# Patient Record
Sex: Male | Born: 1970
Health system: Southern US, Community
[De-identification: ages and names within clinical notes are randomized; demographics above are authoritative.]

## PROBLEM LIST (undated history)

## (undated) DIAGNOSIS — F32A Depression, unspecified: Secondary | ICD-10-CM

## (undated) DIAGNOSIS — F329 Major depressive disorder, single episode, unspecified: Secondary | ICD-10-CM

## (undated) DIAGNOSIS — M5020 Other cervical disc displacement, unspecified cervical region: Secondary | ICD-10-CM

## (undated) HISTORY — DX: Depression, unspecified: F32.A

## (undated) HISTORY — DX: Major depressive disorder, single episode, unspecified: F32.9

---

## 2000-07-28 ENCOUNTER — Emergency Department (HOSPITAL_COMMUNITY): Admission: EM | Admit: 2000-07-28 | Discharge: 2000-07-28 | Payer: Self-pay | Admitting: Emergency Medicine

## 2000-07-28 ENCOUNTER — Encounter: Payer: Self-pay | Admitting: Emergency Medicine

## 2005-01-10 ENCOUNTER — Emergency Department (HOSPITAL_COMMUNITY): Admission: EM | Admit: 2005-01-10 | Discharge: 2005-01-10 | Payer: Self-pay | Admitting: Emergency Medicine

## 2005-03-07 ENCOUNTER — Ambulatory Visit (HOSPITAL_COMMUNITY): Admission: RE | Admit: 2005-03-07 | Discharge: 2005-03-07 | Payer: Self-pay | Admitting: Family Medicine

## 2016-01-11 DIAGNOSIS — R109 Unspecified abdominal pain: Secondary | ICD-10-CM | POA: Diagnosis not present

## 2016-01-11 DIAGNOSIS — M542 Cervicalgia: Secondary | ICD-10-CM | POA: Diagnosis not present

## 2016-01-24 DIAGNOSIS — Z0000 Encounter for general adult medical examination without abnormal findings: Secondary | ICD-10-CM | POA: Diagnosis not present

## 2016-01-24 DIAGNOSIS — R350 Frequency of micturition: Secondary | ICD-10-CM | POA: Diagnosis not present

## 2016-01-24 DIAGNOSIS — Z13220 Encounter for screening for lipoid disorders: Secondary | ICD-10-CM | POA: Diagnosis not present

## 2016-01-24 DIAGNOSIS — Z131 Encounter for screening for diabetes mellitus: Secondary | ICD-10-CM | POA: Diagnosis not present

## 2016-02-08 DIAGNOSIS — M5020 Other cervical disc displacement, unspecified cervical region: Secondary | ICD-10-CM | POA: Diagnosis not present

## 2016-02-08 DIAGNOSIS — M546 Pain in thoracic spine: Secondary | ICD-10-CM | POA: Diagnosis not present

## 2016-02-08 DIAGNOSIS — M542 Cervicalgia: Secondary | ICD-10-CM | POA: Diagnosis not present

## 2016-02-08 DIAGNOSIS — M545 Low back pain: Secondary | ICD-10-CM | POA: Diagnosis not present

## 2016-02-08 DIAGNOSIS — M4722 Other spondylosis with radiculopathy, cervical region: Secondary | ICD-10-CM | POA: Diagnosis not present

## 2016-03-14 DIAGNOSIS — M546 Pain in thoracic spine: Secondary | ICD-10-CM | POA: Diagnosis not present

## 2016-03-14 DIAGNOSIS — M542 Cervicalgia: Secondary | ICD-10-CM | POA: Diagnosis not present

## 2016-03-14 DIAGNOSIS — M5030 Other cervical disc degeneration, unspecified cervical region: Secondary | ICD-10-CM | POA: Diagnosis not present

## 2016-03-14 DIAGNOSIS — M5020 Other cervical disc displacement, unspecified cervical region: Secondary | ICD-10-CM | POA: Diagnosis not present

## 2016-03-14 DIAGNOSIS — M4722 Other spondylosis with radiculopathy, cervical region: Secondary | ICD-10-CM | POA: Diagnosis not present

## 2016-05-03 DIAGNOSIS — R350 Frequency of micturition: Secondary | ICD-10-CM | POA: Diagnosis not present

## 2016-05-03 DIAGNOSIS — F411 Generalized anxiety disorder: Secondary | ICD-10-CM | POA: Diagnosis not present

## 2016-05-15 DIAGNOSIS — M75101 Unspecified rotator cuff tear or rupture of right shoulder, not specified as traumatic: Secondary | ICD-10-CM | POA: Diagnosis not present

## 2016-05-18 DIAGNOSIS — M5030 Other cervical disc degeneration, unspecified cervical region: Secondary | ICD-10-CM | POA: Diagnosis not present

## 2016-05-18 DIAGNOSIS — M4722 Other spondylosis with radiculopathy, cervical region: Secondary | ICD-10-CM | POA: Diagnosis not present

## 2016-05-18 DIAGNOSIS — M5020 Other cervical disc displacement, unspecified cervical region: Secondary | ICD-10-CM | POA: Diagnosis not present

## 2016-05-18 DIAGNOSIS — M75101 Unspecified rotator cuff tear or rupture of right shoulder, not specified as traumatic: Secondary | ICD-10-CM | POA: Diagnosis not present

## 2016-05-27 DIAGNOSIS — L237 Allergic contact dermatitis due to plants, except food: Secondary | ICD-10-CM | POA: Diagnosis not present

## 2016-07-18 DIAGNOSIS — M5030 Other cervical disc degeneration, unspecified cervical region: Secondary | ICD-10-CM | POA: Diagnosis not present

## 2016-07-18 DIAGNOSIS — M4722 Other spondylosis with radiculopathy, cervical region: Secondary | ICD-10-CM | POA: Diagnosis not present

## 2016-07-18 DIAGNOSIS — M5412 Radiculopathy, cervical region: Secondary | ICD-10-CM | POA: Diagnosis not present

## 2016-07-18 DIAGNOSIS — M5020 Other cervical disc displacement, unspecified cervical region: Secondary | ICD-10-CM | POA: Diagnosis not present

## 2016-07-28 DIAGNOSIS — Z840 Family history of diseases of the skin and subcutaneous tissue: Secondary | ICD-10-CM | POA: Diagnosis not present

## 2016-07-28 DIAGNOSIS — F411 Generalized anxiety disorder: Secondary | ICD-10-CM | POA: Diagnosis not present

## 2016-07-28 DIAGNOSIS — R5382 Chronic fatigue, unspecified: Secondary | ICD-10-CM | POA: Diagnosis not present

## 2016-08-01 DIAGNOSIS — M75101 Unspecified rotator cuff tear or rupture of right shoulder, not specified as traumatic: Secondary | ICD-10-CM | POA: Diagnosis not present

## 2016-08-01 DIAGNOSIS — M4722 Other spondylosis with radiculopathy, cervical region: Secondary | ICD-10-CM | POA: Diagnosis not present

## 2016-08-14 DIAGNOSIS — M5020 Other cervical disc displacement, unspecified cervical region: Secondary | ICD-10-CM | POA: Diagnosis not present

## 2016-08-14 DIAGNOSIS — M4722 Other spondylosis with radiculopathy, cervical region: Secondary | ICD-10-CM | POA: Diagnosis not present

## 2016-08-14 DIAGNOSIS — M5412 Radiculopathy, cervical region: Secondary | ICD-10-CM | POA: Diagnosis not present

## 2016-08-14 DIAGNOSIS — M5030 Other cervical disc degeneration, unspecified cervical region: Secondary | ICD-10-CM | POA: Diagnosis not present

## 2016-11-21 DIAGNOSIS — G4709 Other insomnia: Secondary | ICD-10-CM | POA: Diagnosis not present

## 2016-11-21 DIAGNOSIS — R5383 Other fatigue: Secondary | ICD-10-CM | POA: Diagnosis not present

## 2016-11-21 DIAGNOSIS — R1012 Left upper quadrant pain: Secondary | ICD-10-CM | POA: Diagnosis not present

## 2016-11-21 DIAGNOSIS — F411 Generalized anxiety disorder: Secondary | ICD-10-CM | POA: Diagnosis not present

## 2016-11-27 DIAGNOSIS — N200 Calculus of kidney: Secondary | ICD-10-CM | POA: Diagnosis not present

## 2016-11-27 DIAGNOSIS — N289 Disorder of kidney and ureter, unspecified: Secondary | ICD-10-CM | POA: Diagnosis not present

## 2016-12-07 DIAGNOSIS — F411 Generalized anxiety disorder: Secondary | ICD-10-CM | POA: Diagnosis not present

## 2016-12-07 DIAGNOSIS — G4700 Insomnia, unspecified: Secondary | ICD-10-CM | POA: Diagnosis not present

## 2016-12-07 DIAGNOSIS — R5383 Other fatigue: Secondary | ICD-10-CM | POA: Diagnosis not present

## 2016-12-13 DIAGNOSIS — N281 Cyst of kidney, acquired: Secondary | ICD-10-CM | POA: Diagnosis not present

## 2016-12-20 ENCOUNTER — Ambulatory Visit: Payer: BLUE CROSS/BLUE SHIELD | Attending: Neurosurgery

## 2016-12-20 DIAGNOSIS — M6281 Muscle weakness (generalized): Secondary | ICD-10-CM | POA: Insufficient documentation

## 2016-12-20 DIAGNOSIS — M62838 Other muscle spasm: Secondary | ICD-10-CM | POA: Diagnosis not present

## 2016-12-20 DIAGNOSIS — M542 Cervicalgia: Secondary | ICD-10-CM | POA: Diagnosis not present

## 2016-12-20 DIAGNOSIS — M79601 Pain in right arm: Secondary | ICD-10-CM | POA: Insufficient documentation

## 2016-12-20 NOTE — Therapy (Signed)
[facility] [address], [address] [place], [place], [address] Phone: [phone]   Fax:  [phone]  Physical Therapy Evaluation  Patient Details  Name: Henry Jennings MRN: [id number] Date of Birth: [birth year] Referring Provider: Shirlean Kelly, MD   Encounter Date: 12/20/2016  PT End of Session - 12/20/16 1615    Visit Number  1    Date for PT Re-Evaluation  02/14/17    PT Start Time  1533    PT Stop Time  1615    PT Time Calculation (min)  42 min    Activity Tolerance  Patient tolerated treatment well    Behavior During Therapy  [facility] for tasks assessed/performed       Past Medical History:  Diagnosis Date  . Depression     History reviewed. No pertinent surgical history.  There were no vitals filed for this visit.   Subjective Assessment - 12/20/16 1536    Subjective  Pt presents to PT with complaints of neck pain and Rt UE radiculopathy/weakness of a chronic nature.  Pt describes symptoms that began many years ago with incresaed symptoms over the past year. Pt had MRI over the Summer and reports that he has HNP C2-5 and MD wants to fuse if PT doesn't help.      Diagnostic tests  MRI: HNP C2-5    Patient Stated Goals  reduce Rt UE radiculopathy, improve Rt UE strength, avoid surgery    Currently in Pain?  Yes    Pain Score  3     Pain Location  Neck    Pain Orientation  Right;Left    Pain Descriptors / Indicators  Aching;Burning;Stabbing;Constant    Pain Radiating Towards  Rt UE radiculopathy    Pain Onset  More than a month ago    Pain Frequency  Constant    Aggravating Factors   stress, typing/computer, writing    Pain Relieving Factors  massager, rest, supplements for inflammation         [facility] PT Assessment - 12/20/16 0001      Assessment   Medical Diagnosis  herniated nucleus pulposus, cervical, Rt    Referring Provider  Shirlean Kelly, MD    Onset Date/Surgical Date  12/21/15    Next MD Visit   01/2017      Precautions   Precautions  None      Restrictions   Weight Bearing Restrictions  No      Balance Screen   Has the patient fallen in the past 6 months  No    Has the patient had a decrease in activity level because of a fear of falling?   No    Is the patient reluctant to leave their home because of a fear of falling?   No      Home Public house manager residence    Living Arrangements  Spouse/significant other      Prior Function   Level of Independence  Independent    Vocation  Unemployed    Leisure  hiking, writing, art, music      Cognition   Overall Cognitive Status  Within Functional Limits for tasks assessed      Observation/Other Assessments   Focus on Therapeutic Outcomes (FOTO)   49% limitaiton      Posture/Postural Control   Posture/Postural Control  Postural limitations    Postural Limitations  Decreased thoracic kyphosis;Forward head      ROM / Strength  AROM / PROM / Strength  AROM;Strength      AROM   Overall AROM   Deficits    Overall AROM Comments  cervical A/ROM is full except limited by 25% with Lt rotation and bil sidebending with muscle stiffness at end range.  No change in Rt UE with cervical A/ROM.  Rt shoulder is painful at end range.      Strength   Overall Strength  Deficits    Overall Strength Comments  Lt shoulder strength is full    Strength Assessment Site  Shoulder;Hand    Right/Left Shoulder  Right    Right Shoulder Flexion  4-/5    Right Shoulder ABduction  4-/5    Right Shoulder Internal Rotation  3+/5    Right Shoulder External Rotation  4-/5    Right/Left hand  Right;Left    Right Hand Grip (lbs)  50    Left Hand Grip (lbs)  71      Palpation   Spinal mobility  reduced mobility in the cervical vertebral segments with pain    Palpation comment  tension and trigger points over bil upper traps and cervical paraspinals .      Ambulation/Gait   Gait Pattern  Within Functional Limits              Objective measurements completed on examination: See above findings.              PT Education - 12/20/16 1612    Education provided  Yes    Education Details  posture, scap squeezes, chin tucks    Person(s) Educated  Patient    Methods  Explanation;Demonstration;Handout    Comprehension  Verbalized understanding;Returned demonstration       PT Short Term Goals - 12/20/16 1547      PT SHORT TERM GOAL #1   Title  be independent in initial HEP    Time  4    Period  Weeks    Status  New    Target Date  01/17/17      PT SHORT TERM GOAL #2   Title  report a 25% reduction in Rt UE radiculopathy    Time  4    Period  Weeks    Status  New    Target Date  01/17/17      PT SHORT TERM GOAL #3   Title  demonstrate 4/5 Rt shoulder strength to improve use with ADLs and self-care    Time  4    Period  Weeks    Status  New        PT Long Term Goals - 12/20/16 1703      PT LONG TERM GOAL #1   Title  be independent in advanced HEP    Time  8    Period  Weeks    Status  New    Target Date  02/14/17      PT LONG TERM GOAL #2   Title  report a 50% reduction in Rt UE radiculopathy    Time  8    Period  Weeks    Status  New    Target Date  02/14/17      PT LONG TERM GOAL #3   Title  demonstrate > or = to 65# Rt grip strength to improve lifting and holding heavy items.      Time  8    Period  Weeks    Status  New    Target Date  02/14/17      PT LONG TERM GOAL #4   Title  report < or = to 2/10 cervical pain with turning head or sitting at the computer    Time  4    Period  Weeks    Status  New    Target Date  02/14/17             Plan - 12/20/16 1640    Clinical Impression Statement  Pt presents to PT with complaints of neck pain and Rt UE radiculopathy and weakness that is chronic and worsened over the past year.  Pt had MRI and this showed C3-5 HNP per pt report.  Pt demonstrates reduced thoracic kyphosis, forward head posture, Rt UE  and hand weakness and limited/painful Rt UE and cervical A/ROM.  Pt reports 3-5/10 Rt UE pain that increases with computer use, writing and when flexing the head.  Pt will benefit from skilled PT for Rt UE strength, flexibility, cervical stabilization, postural stabilization, manual, modalities and traction to address pain and radiculopathy.    History and Personal Factors relevant to plan of care:  none    Clinical Presentation  Evolving    Clinical Presentation due to:  worsening over the past year and losing Rt UE strength    Clinical Decision Making  Low    Rehab Potential  Good    PT Frequency  2x / week    PT Duration  8 weeks    PT Treatment/Interventions  ADLs/Self Care Home Management;Cryotherapy;Geologist, engineering;Therapeutic activities;Therapeutic exercise;Neuromuscular re-education;Patient/family education;Manual techniques;Taping    PT Next Visit Plan  traction, cervical retraction/extension, Rt UE strength, grip strength, manual to cervical musculature    Consulted and Agree with Plan of Care  Patient       Patient will benefit from skilled therapeutic intervention in order to improve the following deficits and impairments:  Pain, Impaired flexibility, Decreased activity tolerance, Decreased range of motion, Decreased strength, Postural dysfunction, Increased muscle spasms  Visit Diagnosis: Cervicalgia - Plan: PT plan of care cert/re-cert  Pain in right arm - Plan: PT plan of care cert/re-cert  Muscle weakness (generalized) - Plan: PT plan of care cert/re-cert  Other muscle spasm - Plan: PT plan of care cert/re-cert     Problem List There are no active problems to display for this patient.   [name], PT 12/20/16 5:06 PM  [facility] [address] [place], [place], [address] Phone: [phone]   Fax:  [phone]  Name: Henry Jennings MRN:  [id number] Date of Birth: Nov 03, 1970

## 2016-12-20 NOTE — Patient Instructions (Addendum)
  Scapular Retraction: Elbow Flexion (Standing)   With elbows bent to 90, pinch shoulder blades together and rotate arms out, keeping elbows bent. Repeat _10___ times per set. Do _1___ sets per session. Do many____ sessions per day.   Nod: Deep Cervical Flexor Retrain - Supine    Nod head, tipping chin down. Tighten muscles in back of throat. Hold _5__ seconds. Do __10_ times, __3_ times per day.  http://ss.exer.us/192   Copyright  VHI. All rights reserved.  Axial Extension (Chin Tuck)    Pull chin in and lengthen back of neck. Hold _5___ seconds while counting out loud. Repeat _10___ times. Do _3___ sessions per day.  http://gt2.exer.us/449   Copyright  VHI. All rights reserved.     Posture - Standing   Good posture is important. Avoid slouching and forward head thrust. Maintain curve in low back and align ears over shoulders, hips over ankles.  Pull your belly button in toward your back bone. Posture Tips DO: - stand tall and erect - keep chin tucked in - keep head and shoulders in alignment - check posture regularly in mirror or large window - pull head back against headrest in car seat;  Change your position often.  Sit with lumbar support. DON'T: - slouch or slump while watching TV or reading - sit, stand or lie in one position  for too long;  Sitting is especially hard on the spine so if you sit at a desk/use the computer, then stand up often! Copyright  VHI. All rights reserved.  Posture - Sitting  Sit upright, head facing forward. Try using a roll to support lower back. Keep shoulders relaxed, and avoid rounded back. Keep hips level with knees. Avoid crossing legs for long periods. Copyright  VHI. All rights reserved.  Chronic neck strain can develop because of poor posture and faulty work habits  Postural strain related to slumped sitting and forward head posture is a leading cause of headaches, neck and upper back pain  General strengthening and  flexibility exercises are helpful in the treatment of neck pain.  Most importantly, you should learn to correct the posture that may be contributing to chronic pain.   Change positions frequently  Change your work or home environment to improve posture and mechanics.   [facility] [address], [address] [place], [place] [address] Phone # [phone] Fax [phone]

## 2017-01-04 DIAGNOSIS — F411 Generalized anxiety disorder: Secondary | ICD-10-CM | POA: Diagnosis not present

## 2017-01-04 DIAGNOSIS — G4700 Insomnia, unspecified: Secondary | ICD-10-CM | POA: Diagnosis not present

## 2017-01-10 ENCOUNTER — Ambulatory Visit: Payer: BLUE CROSS/BLUE SHIELD | Attending: Neurosurgery

## 2017-01-10 DIAGNOSIS — M6281 Muscle weakness (generalized): Secondary | ICD-10-CM | POA: Diagnosis not present

## 2017-01-10 DIAGNOSIS — M62838 Other muscle spasm: Secondary | ICD-10-CM | POA: Diagnosis not present

## 2017-01-10 DIAGNOSIS — M542 Cervicalgia: Secondary | ICD-10-CM

## 2017-01-10 DIAGNOSIS — M79601 Pain in right arm: Secondary | ICD-10-CM | POA: Diagnosis not present

## 2017-01-10 NOTE — Patient Instructions (Addendum)
KEEP HEAD IN NEUTRAL AND SHOULDERS DOWN AND RELAXED   Both arms at the same time.  Hold tubing in right hand, arm forward. Pull arm back, elbow straight. Repeat __10__ times per set. Do __2__ sets per session. Do _1-2___ sessions per day.  Copyright  VHI. All rights reserved.     With resistive band anchored in door, grasp both ends. Keeping elbows bent, pull back, squeezing shoulder blades together. Hold _3__ seconds. Repeat _2x10___ times. Do _1-2___ sessions per day.    Side Pull: Double Arm   On back, knees bent, feet flat. Arms perpendicular to body, shoulder level, elbows straight but relaxed. Pull arms out to sides, elbows straight. Resistance band comes across collarbones, hands toward floor. Hold momentarily. Slowly return to starting position. Repeat _10__ times. Band color _yellow____     Shoulder Rotation: Double Arm   On back, knees bent, feet flat, elbows tucked at sides, bent 90, hands palms up. Pull hands apart and down toward floor, keeping elbows near sides. Hold momentarily. Slowly return to starting position. Repeat _10__ times. Band color __yellow    Angry Cat Stretch  Tuck chin and tighten stomach, arching back. Repeat __5-10__ times per set.  Do _1-2___ sessions per day. Child Pose   Sitting on knees, fold body over legs and relax head and arms on floor. Hold for _20___ breaths.  Do 3 reps.  1-2 times a day.       [facility] [address], [address] [place], [place] [address] Phone # [phone] Fax [phone]____

## 2017-01-10 NOTE — Therapy (Signed)
[facility] [address], [address] [place], [place], [address] Phone: [phone]   Fax:  [phone]  Physical Therapy Treatment  Patient Details  Name: Henry Jennings MRN: [id number] Date of Birth: 06/28/1970 Referring Provider: Shirlean KellyNudelman, Robert, MD   Encounter Date: 01/10/2017  PT End of Session - 01/10/17 1435    Visit Number  2    Date for PT Re-Evaluation  02/14/17    PT Start Time  1401    PT Stop Time  1449    PT Time Calculation (min)  48 min    Activity Tolerance  Patient tolerated treatment well    Behavior During Therapy  [facility] for tasks assessed/performed       Past Medical History:  Diagnosis Date  . Depression     History reviewed. No pertinent surgical history.  There were no vitals filed for this visit.  Subjective Assessment - 01/10/17 1402    Subjective  I started taking a new medication, Trazodone.  I feel a little better but not sure if it was the medication or the exercises.      Patient Stated Goals  reduce Rt UE radiculopathy, improve Rt UE strength, avoid surgery    Currently in Pain?  Yes    Pain Score  5     Pain Location  Neck    Pain Orientation  Right    Pain Descriptors / Indicators  Aching;Burning;Stabbing;Constant    Pain Type  Chronic pain    Pain Onset  More than a month ago    Pain Frequency  Constant    Aggravating Factors   typing/computer, lifting, stress, writing, turning head with driving    Pain Relieving Factors  massager, rest, supplements for inflammation                      [facility] Adult PT Treatment/Exercise - 01/10/17 0001      Exercises   Exercises  Shoulder;Neck      Neck Exercises: Supine   Neck Retraction  20 reps;5 secs      Shoulder Exercises: Supine   External Rotation  Strengthening;Both;20 reps;Theraband    Theraband Level (Shoulder External Rotation)  Level 2 (Red)      Shoulder Exercises: Standing   Extension  Strengthening;Both;20  reps;Theraband    Theraband Level (Shoulder Extension)  Level 2 (Red)    Row  Strengthening;Both;20 reps;Theraband    Theraband Level (Shoulder Row)  Level 2 (Red)      Shoulder Exercises: ROM/Strengthening   UBE (Upper Arm Bike)  Level 1x 6 minutes (3/3) PT present to discuss progress      Shoulder Exercises: Stretch   Other Shoulder Stretches  quadruped: prayer stretch 3x20 seconds    Other Shoulder Stretches  mad cow: thoracic stretch x10      Modalities   Modalities  Traction      Traction   Type of Traction  Cervical    Min (lbs)  5    Max (lbs)  15    Hold Time  60    Rest Time  10    Time  15             PT Education - 01/10/17 1434    Education provided  Yes    Education Details  quadruped flexibility, supine theraband, standing scapular strength    Person(s) Educated  Patient    Methods  Explanation;Demonstration;Handout    Comprehension  Verbalized understanding;Returned demonstration  PT Short Term Goals - 01/10/17 1405      PT SHORT TERM GOAL #1   Title  be independent in initial HEP    Baseline  still learning    Time  4    Period  Weeks    Status  On-going      PT SHORT TERM GOAL #2   Title  report a 25% reduction in Rt UE radiculopathy    Time  4    Period  Weeks    Status  On-going        PT Long Term Goals - 12/20/16 1703      PT LONG TERM GOAL #1   Title  be independent in advanced HEP    Time  8    Period  Weeks    Status  New    Target Date  02/14/17      PT LONG TERM GOAL #2   Title  report a 50% reduction in Rt UE radiculopathy    Time  8    Period  Weeks    Status  New    Target Date  02/14/17      PT LONG TERM GOAL #3   Title  demonstrate > or = to 65# Rt grip strength to improve lifting and holding heavy items.      Time  8    Period  Weeks    Status  New    Target Date  02/14/17      PT LONG TERM GOAL #4   Title  report < or = to 2/10 cervical pain with turning head or sitting at the computer    Time  4     Period  Weeks    Status  New    Target Date  02/14/17            Plan - 01/10/17 1406    Clinical Impression Statement  Pt with lapse in treatment after evaluation.  Pt has been doing his exercises for retraction and has been working on posture.  PT advanced HEP for posutral strength in supine and standing and empasized scapular position and neck retraction with this.  Pt responded well to traction today.  Pt will continue to benefit from skilled PT for traction to address radiculopathy and posutral strength and thoracic/cervical flexibility.      Rehab Potential  Good    PT Frequency  2x / week    PT Duration  8 weeks    PT Treatment/Interventions  ADLs/Self Care Home Management;Cryotherapy;Geologist, engineering;Therapeutic activities;Therapeutic exercise;Neuromuscular re-education;Patient/family education;Manual techniques;Taping    PT Next Visit Plan  assess response to traction, cervical retraction/extension, Rt UE strength, grip strength, manual to cervical musculature    Consulted and Agree with Plan of Care  Patient       Patient will benefit from skilled therapeutic intervention in order to improve the following deficits and impairments:  Pain, Impaired flexibility, Decreased activity tolerance, Decreased range of motion, Decreased strength, Postural dysfunction, Increased muscle spasms  Visit Diagnosis: Cervicalgia  Pain in right arm  Muscle weakness (generalized)  Other muscle spasm     Problem List There are no active problems to display for this patient.    [name], PT 01/10/17 2:37 PM  [facility] [address], [address] [place], [place], [address] Phone: [phone]   Fax:  [phone]  Name: Henry Jennings MRN: [id number] Date of Birth: 20-Apr-1970

## 2017-01-17 ENCOUNTER — Ambulatory Visit: Payer: BLUE CROSS/BLUE SHIELD

## 2017-01-17 DIAGNOSIS — M6281 Muscle weakness (generalized): Secondary | ICD-10-CM | POA: Diagnosis not present

## 2017-01-17 DIAGNOSIS — M79601 Pain in right arm: Secondary | ICD-10-CM

## 2017-01-17 DIAGNOSIS — M542 Cervicalgia: Secondary | ICD-10-CM | POA: Diagnosis not present

## 2017-01-17 DIAGNOSIS — M62838 Other muscle spasm: Secondary | ICD-10-CM | POA: Diagnosis not present

## 2017-01-17 NOTE — Therapy (Signed)
[facility] [address] [place], [place], [address] Phone: [phone]   Fax:  [phone]  Physical Therapy Treatment  Patient Details  Name: Henry Jennings MRN: [id number] Date of Birth: [birth year] Referring Provider: Shirlean Kelly, MD   Encounter Date: 01/17/2017  PT End of Session - 01/17/17 1426    Visit Number  3    Date for PT Re-Evaluation  02/14/17    PT Start Time  1400    PT Stop Time  1450    PT Time Calculation (min)  50 min    Activity Tolerance  Patient tolerated treatment well    Behavior During Therapy  [facility] for tasks assessed/performed       Past Medical History:  Diagnosis Date  . Depression     History reviewed. No pertinent surgical history.  There were no vitals filed for this visit.  Subjective Assessment - 01/17/17 1400    Subjective  I was sore for a day after my last treatment and then had a couple of good days.  I haven't had to correct my posture as much this week.      Currently in Pain?  Yes    Pain Score  5     Pain Location  Neck    Pain Orientation  Right    Pain Descriptors / Indicators  Aching;Burning;Constant;Stabbing    Pain Type  Chronic pain    Pain Onset  More than a month ago    Pain Frequency  Constant    Aggravating Factors   typing/computer, lifting, stress    Pain Relieving Factors  medication, rest, massager         [facility] PT Assessment - 01/17/17 0001      Strength   Right Hand Grip (lbs)  51                  [facility] Adult PT Treatment/Exercise - 01/17/17 0001      Shoulder Exercises: Supine   External Rotation  Strengthening;Both;20 reps;Theraband    Theraband Level (Shoulder External Rotation)  Level 2 (Red)      Shoulder Exercises: Standing   Extension  Strengthening;Both;20 reps;Theraband    Theraband Level (Shoulder Extension)  Level 2 (Red)    Row  Strengthening;Both;20 reps;Theraband    Theraband Level (Shoulder Row)  Level 2  (Red)      Shoulder Exercises: ROM/Strengthening   UBE (Upper Arm Bike)  Level 1x 6 minutes (3/3) PT present to discuss progress    Other ROM/Strengthening Exercises  D2  2x10 red      Hand Exercises for Cervical Radiculopathy   Gross Grasp  red digiflex x 3 minutes      Modalities   Modalities  Traction      Traction   Type of Traction  Cervical    Min (lbs)  5    Max (lbs)  15    Hold Time  60    Rest Time  10    Time  15             PT Education - 01/17/17 1417    Education provided  Yes    Education Details  3 way raises     Person(s) Educated  Patient    Methods  Explanation;Demonstration;Handout    Comprehension  Verbalized understanding;Returned demonstration       PT Short Term Goals - 01/17/17 1403      PT SHORT TERM GOAL #1  Title  be independent in initial HEP    Status  Achieved      PT SHORT TERM GOAL #2   Title  report a 25% reduction in Rt UE radiculopathy    Baseline  5-10%    Time  4    Period  Weeks    Status  On-going        PT Long Term Goals - 01/17/17 1409      PT LONG TERM GOAL #3   Title  demonstrate > or = to 65# Rt grip strength to improve lifting and holding heavy items.      Baseline  51#    Time  8    Period  Weeks    Status  On-going            Plan - 01/17/17 1403    Clinical Impression Statement  Pt had traction last session and reports a reduction in his Rt UE pain with it being less frequent.  Pt is making postural corrections and reports he has more endurance for this now.  Pt remains weak in the Rt shoulder and hand and PT emphasized strength exercises today.  Pt will benefit from continued skilled PT for Rt UE strength, traction, postural strength and cervical flexibility.      Rehab Potential  Good    PT Frequency  2x / week    PT Duration  8 weeks    PT Treatment/Interventions  ADLs/Self Care Home Management;Cryotherapy;Geologist, engineering;Therapeutic  activities;Therapeutic exercise;Neuromuscular re-education;Patient/family education;Manual techniques;Taping    PT Next Visit Plan  assess response to traction, cervical retraction/extension, Rt UE strength, grip strength, manual to cervical musculature    Consulted and Agree with Plan of Care  Patient       Patient will benefit from skilled therapeutic intervention in order to improve the following deficits and impairments:  Pain, Impaired flexibility, Decreased activity tolerance, Decreased range of motion, Decreased strength, Postural dysfunction, Increased muscle spasms  Visit Diagnosis: Cervicalgia  Pain in right arm  Muscle weakness (generalized)  Other muscle spasm     Problem List There are no active problems to display for this patient.    [name], PT 01/17/17 2:33 PM  [facility] [address], [address] [place], [place], [address] Phone: [phone]   Fax:  [phone]  Name: AADAM ARGUDO MRN: [id number] Date of Birth: 17-Jul-1970

## 2017-01-17 NOTE — Patient Instructions (Addendum)
SHOULDER: Flexion Unilateral (Weight)   Start with arm at side. Raise both arms forward and up to shoulder high  Keep elbows straight.  Use   lb weight.10  reps per set, 2-3___ sets per day.  Copyright  VHI. All rights reserved.  SHOULDER: Abduction (Weight)   Raise arm out and up. Keep elbow straight. Do not shrug shoulders. Use  # lb weight. _10__ reps per set, 2-_3__ sets per day.  Copyright  VHI. All rights reserved.  SHOULDER: Scaption (Weight)   Place arm at 45 angle to body. Raise arm up to shoulder level - shoulder keeping elbow straight.  Use   lb weight. _10__ reps per set, _2-3__ sets per day.  [facility] [address], [address] [place], [place] [address] Phone # [phone] Fax [phone]   

## 2017-01-24 ENCOUNTER — Ambulatory Visit: Payer: BLUE CROSS/BLUE SHIELD

## 2017-01-24 DIAGNOSIS — M79601 Pain in right arm: Secondary | ICD-10-CM

## 2017-01-24 DIAGNOSIS — M542 Cervicalgia: Secondary | ICD-10-CM

## 2017-01-24 DIAGNOSIS — M62838 Other muscle spasm: Secondary | ICD-10-CM

## 2017-01-24 DIAGNOSIS — M6281 Muscle weakness (generalized): Secondary | ICD-10-CM | POA: Diagnosis not present

## 2017-01-24 NOTE — Patient Instructions (Addendum)
PERFORM ALL EXERCISES GENTLY AND WITH GOOD POSTURE.    20 SECOND HOLD, 3 REPS TO EACH SIDE. 4-5 TIMES EACH DAY.   AROM: Neck Rotation   Turn head slowly to look over one shoulder, then the other.   AROM: Neck Flexion   Bend head forward.   AROM: Lateral Neck Flexion   Slowly tilt head toward one shoulder, then the other.  [facility] [address], [address] [place], [place] [address] Phone # [phone] Fax [phone] 

## 2017-01-24 NOTE — Therapy (Signed)
Goals - 01/24/17 1406      PT SHORT TERM GOAL #1   Title  be independent in initial HEP    Status  Achieved      PT SHORT TERM GOAL #2   Title  report a 25% reduction in Rt UE radiculopathy    Baseline  10-15% better     Time  4    Period  Weeks    Status  On-going        PT Long Term Goals - 01/17/17 1409      PT LONG TERM GOAL #3   Title  demonstrate > or = to 65# Rt grip strength to improve lifting and holding heavy items.      Baseline  51#    Time  8    Period  Weeks    Status  On-going            Plan - 01/24/17 1408    Clinical Impression Statement  Pt reports 10-15% reduction in Rt UE frequency and intensity of pain.  Pt reports continued neck pain that feels stiff.  Pt is working on postural corrections and demonstrates good posture throughout session today.  Pt remains weak in the Rt shoulder and hand and has HEP in place to address.  Rt shoulder strength was improved today.  Pt with only 1x/wk for treatment so slow progress toward goals.  Pt will continue to benefit from skilled PT for postural strength, Rt UE strength, traction and manual as needed.      PT Frequency  2x / week    PT Duration  8 weeks    PT  Treatment/Interventions  ADLs/Self Care Home Management;Cryotherapy;Geologist, engineering;Therapeutic activities;Therapeutic exercise;Neuromuscular re-education;Patient/family education;Manual techniques;Taping    PT Next Visit Plan  assess response to traction, cervical flexibility, Rt UE strength, grip strength, manual to cervical musculature    Consulted and Agree with Plan of Care  Patient       Patient will benefit from skilled therapeutic intervention in order to improve the following deficits and impairments:  Pain, Impaired flexibility, Decreased activity tolerance, Decreased range of motion, Decreased strength, Postural dysfunction, Increased muscle spasms  Visit Diagnosis: Cervicalgia  Pain in right arm  Muscle weakness (generalized)  Other muscle spasm     Problem List There are no active problems to display for this patient.   [name], PT 01/24/17 2:38 PM  [facility] [address], [address] [place], [place], [address] Phone: [phone]   Fax:  [phone]  Name: MARIANO DOSHI MRN: [id number] Date of Birth: 1970-07-22  [facility] [address] [place], [place], [address] Phone: [phone]   Fax:  [phone]  Physical Therapy Treatment  Patient Details  Name: Jacques EarthlyBrian L Whitecotton MRN: [id number] Date of Birth: December 04, 1970 Referring Provider: Shirlean KellyNudelman, Robert, MD   Encounter Date: 01/24/2017  PT End of Session - 01/24/17 1436    Visit Number  4    Date for PT Re-Evaluation  02/14/17    PT Start Time  1401    PT Stop Time  1451    PT Time Calculation (min)  50 min    Activity Tolerance  Patient tolerated treatment well    Behavior During Therapy  [facility] for tasks assessed/performed       Past Medical History:  Diagnosis Date  . Depression     History reviewed. No pertinent surgical history.  There were no vitals filed for this visit.  Subjective Assessment - 01/24/17 1404    Subjective  I was sore for one day and then had 2-3 good days after traction last session.  Overall better.      Patient Stated Goals  reduce Rt UE radiculopathy, improve Rt UE strength, avoid surgery    Currently in Pain?  Yes    Pain Score  5  4/10 over the weekend    Pain Location  Neck    Pain Orientation  Right    Pain Descriptors / Indicators  Aching;Burning;Constant    Pain Type  Chronic pain    Pain Onset  More than a month ago    Pain Frequency  Constant    Aggravating Factors   typing/lifting, lifting, stress    Pain Relieving Factors  medication, rest, massager         [facility] - 01/24/17 0001      Strength   Right/Left Shoulder  Right    Right Shoulder Flexion  4/5    Right Shoulder ABduction  4-/5    Right Shoulder Internal Rotation  4/5    Right Shoulder External Rotation  4/5                  [facility] Adult PT Treatment/Exercise - 01/24/17 0001      Shoulder Exercises: Seated   Other Seated Exercises  3 way raises: 1# 2x10      Shoulder Exercises: ROM/Strengthening   UBE (Upper Arm Bike)  Level 1x 6 minutes  (3/3) PT present to discuss progress    Other ROM/Strengthening Exercises  D2  2x10 red tried in supine due to elbow pain      Hand Exercises for Cervical Radiculopathy   Gross Grasp  red digiflex x 3 minutes      Modalities   Modalities  Traction      Traction   Type of Traction  Cervical    Min (lbs)  5    Max (lbs)  15    Hold Time  60    Rest Time  10    Time  15      Neck Exercises: Stretches   Other Neck Stretches  cervical A/ROM in 3 directions:              PT Education - 01/24/17 1420    Education provided  Yes    Education Details  cervical A/ROM    Person(s) Educated  Patient    Methods  Explanation;Demonstration;Handout    Comprehension  Verbalized understanding;Returned demonstration       PT Short Term

## 2017-01-26 DIAGNOSIS — M5030 Other cervical disc degeneration, unspecified cervical region: Secondary | ICD-10-CM | POA: Diagnosis not present

## 2017-01-26 DIAGNOSIS — M542 Cervicalgia: Secondary | ICD-10-CM | POA: Diagnosis not present

## 2017-01-26 DIAGNOSIS — M5020 Other cervical disc displacement, unspecified cervical region: Secondary | ICD-10-CM | POA: Diagnosis not present

## 2017-01-26 DIAGNOSIS — M4722 Other spondylosis with radiculopathy, cervical region: Secondary | ICD-10-CM | POA: Diagnosis not present

## 2017-01-29 DIAGNOSIS — M4722 Other spondylosis with radiculopathy, cervical region: Secondary | ICD-10-CM | POA: Diagnosis not present

## 2017-01-31 ENCOUNTER — Ambulatory Visit: Payer: BLUE CROSS/BLUE SHIELD

## 2017-01-31 DIAGNOSIS — M6281 Muscle weakness (generalized): Secondary | ICD-10-CM | POA: Diagnosis not present

## 2017-01-31 DIAGNOSIS — M62838 Other muscle spasm: Secondary | ICD-10-CM

## 2017-01-31 DIAGNOSIS — M542 Cervicalgia: Secondary | ICD-10-CM | POA: Diagnosis not present

## 2017-01-31 DIAGNOSIS — M79601 Pain in right arm: Secondary | ICD-10-CM | POA: Diagnosis not present

## 2017-01-31 NOTE — Therapy (Signed)
a 25% reduction in Rt UE radiculopathy    Time  4    Period  Weeks    Status  On-going      PT SHORT TERM GOAL #3   Title  demonstrate 4/5 Rt shoulder strength to improve use with ADLs and self-care    Baseline  4/5 flexion, 4-/5 abduction, IR 4/5, ER 4/5    Time  4    Period  Weeks    Status  On-going        PT Long Term Goals - 01/31/17 1413      PT LONG TERM GOAL #3   Title  demonstrate > or = to 65# Rt grip strength to improve lifting and holding heavy items.      Baseline  60#    Time  8    Period  Weeks    Status  On-going            Plan - 01/31/17 1403    Clinical Impression Statement  Pt with reduction in Rt UE pain overall.  Pt with increased neck pain due to overworking the muscles over the weekend.  Pt is working on postural corrections and demonstrates good posture throughout the session today.  Pt remains weak in the Rt shoulder and hand and is working on this strength at home.  Rt grip strength is improved to 60# today (50# at evaluation).  Pt with only 1 session each week so limited progress toward goals.  Pt will continue to benefit from skilled PT for traction, flexibility, strength and manual as needed.      Rehab Potential  Good    PT Frequency  2x / week     PT Duration  8 weeks    PT Treatment/Interventions  ADLs/Self Care Home Management;Cryotherapy;Geologist, engineeringlectrical Stimulation;Ultrasound;Traction;Moist Heat;Stair training;Therapeutic activities;Therapeutic exercise;Neuromuscular re-education;Patient/family education;Manual techniques;Taping    PT Next Visit Plan  assess response to traction, cervical flexibility, Rt UE strength, grip strength, manual to cervical musculature    Consulted and Agree with Plan of Care  Patient       Patient will benefit from skilled therapeutic intervention in order to improve the following deficits and impairments:  Pain, Impaired flexibility, Decreased activity tolerance, Decreased range of motion, Decreased strength, Postural dysfunction, Increased muscle spasms  Visit Diagnosis: Cervicalgia  Pain in right arm  Muscle weakness (generalized)  Other muscle spasm     Problem List There are no active problems to display for this patient.   Lorrene ReidKelly Dusty Raczkowski, PT 01/31/17 2:37 PM   [facility] [address], [address] [place], [place], [address] Phone: [phone]   Fax:  [phone]  Name: Henry Jennings MRN: [id number] Date of Birth: 08/28/1970  [facility] [address], [address] [place], [place], [address] Phone: [phone]   Fax:  [phone]  Physical Therapy Treatment  Patient Details  Name: Henry Jennings MRN: [id number] Date of Birth: 1970/08/24 Referring Provider: Shirlean Saylee Sherrill, MD   Encounter Date: 01/31/2017  PT End of Session - 01/31/17 1436    Visit Number  5    Date for PT Re-Evaluation  02/14/17    PT Start Time  1356    PT Stop Time  1453    PT Time Calculation (min)  57 min    Activity Tolerance  Patient tolerated treatment well    Behavior During Therapy  [facility] for tasks assessed/performed       Past Medical History:  Diagnosis Date  . Depression     History reviewed. No pertinent surgical history.  There were no vitals filed for this visit.  Subjective Assessment - 01/31/17 1355    Subjective  I did a lot of lifting over the past 2 days so I am more sore today.  Rt UE pain is better, mostly neck pain today.      Diagnostic tests  MRI: HNP C2-5    Patient Stated Goals  reduce Rt UE radiculopathy, improve Rt UE strength, avoid surgery    Currently in Pain?  Yes    Pain Score  6  3-4/10 over the weekend    Pain Location  Neck    Pain Orientation  Right    Pain Descriptors / Indicators  Aching;Burning;Constant    Pain Onset  More than a month ago    Pain Frequency  Constant    Aggravating Factors   lifting, stress    Pain Relieving Factors  rest, stretching, massager         [facility] PT Assessment - 01/31/17 0001      Strength   Right Hand Grip (lbs)  60#                  [facility] Adult PT Treatment/Exercise - 01/31/17 0001      Neck Exercises: Supine   Other Supine Exercise  Melt method for neck using foam roll       Shoulder Exercises: Supine   External Rotation  Strengthening;Both;20 reps;Theraband    Theraband Level (Shoulder External Rotation)  Level 1 (Yellow) on foam roll    Other Supine Exercises  on foam roll:  horizontal abduction and D2 2x10 with yellow band      Shoulder Exercises: Seated   Other Seated Exercises  3 way raises: 1# 2x10      Shoulder Exercises: ROM/Strengthening   UBE (Upper Arm Bike)  Level 1x 6 minutes (3/3) PT present to discuss progress    Other ROM/Strengthening Exercises  D2  2x10 red tried in supine due to elbow pain      Hand Exercises for Cervical Radiculopathy   Gross Grasp  green digiflex x 3 minutes      Modalities   Modalities  Traction      Traction   Type of Traction  Cervical    Min (lbs)  5    Max (lbs)  16    Hold Time  60    Rest Time  10    Time  15               PT Short Term Goals - 01/31/17 1401      PT SHORT TERM GOAL #2   Title  report

## 2017-02-07 ENCOUNTER — Ambulatory Visit: Payer: BLUE CROSS/BLUE SHIELD | Attending: Neurosurgery

## 2017-02-07 DIAGNOSIS — M6281 Muscle weakness (generalized): Secondary | ICD-10-CM | POA: Diagnosis not present

## 2017-02-07 DIAGNOSIS — M79601 Pain in right arm: Secondary | ICD-10-CM

## 2017-02-07 DIAGNOSIS — M542 Cervicalgia: Secondary | ICD-10-CM | POA: Insufficient documentation

## 2017-02-07 DIAGNOSIS — M62838 Other muscle spasm: Secondary | ICD-10-CM | POA: Insufficient documentation

## 2017-02-07 NOTE — Therapy (Signed)
[facility] [address], [address] [place], [place], [address] Phone: [phone]   Fax:  [phone]  Physical Therapy Treatment  Patient Details  Name: Henry Jennings MRN: [id number] Date of Birth: 17-Nov-1970 Referring Provider: Shirlean Kelly, MD   Encounter Date: 02/07/2017  PT End of Session - 02/07/17 1436    Visit Number  6    Date for PT Re-Evaluation  02/14/17    PT Start Time  1400    PT Stop Time  1450    PT Time Calculation (min)  50 min    Activity Tolerance  Patient tolerated treatment well    Behavior During Therapy  [facility] for tasks assessed/performed       Past Medical History:  Diagnosis Date  . Depression     History reviewed. No pertinent surgical history.  There were no vitals filed for this visit.  Subjective Assessment - 02/07/17 1356    Subjective  I still had to do a lot of lifting and this weekend was pretty bad.      Diagnostic tests  MRI: HNP C2-5    Patient Stated Goals  reduce Rt UE radiculopathy, improve Rt UE strength, avoid surgery    Currently in Pain?  Yes    Pain Score  5     Pain Location  Neck    Pain Orientation  Right    Pain Descriptors / Indicators  Aching;Burning    Pain Type  Chronic pain    Pain Onset  More than a month ago    Pain Frequency  Constant    Aggravating Factors   lifting, stress    Pain Relieving Factors  rest, exercises, stretching                      [facility] Adult PT Treatment/Exercise - 02/07/17 0001      Neck Exercises: Standing   Wall Push Ups  20 reps      Shoulder Exercises: Supine   External Rotation  Strengthening;Both;20 reps;Theraband    Theraband Level (Shoulder External Rotation)  Level 2 (Red)    Flexion  Strengthening;Both;20 reps;Theraband    Theraband Level (Shoulder Flexion)  Level 2 (Red)    Other Supine Exercises  on foam roll: horizontal abduction and D2 2x10 with red band      Shoulder Exercises: Seated   Other  Seated Exercises  3 way raises: 1# 2x10 seated on green ball      Shoulder Exercises: ROM/Strengthening   UBE (Upper Arm Bike)  Level 1x 6 minutes (3/3) PT present to discuss progress    Other ROM/Strengthening Exercises  D2  2x10 red tried in supine due to elbow pain      Hand Exercises for Cervical Radiculopathy   Gross Grasp  green digiflex x 3 minutes      Modalities   Modalities  Traction      Traction   Type of Traction  Cervical    Min (lbs)  5    Max (lbs)  16    Hold Time  60    Rest Time  10    Time  15      Neck Exercises: Stretches   Other Neck Stretches  cervical A/ROM in 3 directions:                PT Short Term Goals - 02/07/17 1401      PT SHORT TERM GOAL #1   Title  be independent in initial HEP    Status  Achieved      PT SHORT TERM GOAL #2   Title  report a 25% reduction in Rt UE radiculopathy    Time  4    Period  Weeks    Status  On-going        PT Long Term Goals - 01/31/17 1413      PT LONG TERM GOAL #3   Title  demonstrate > or = to 65# Rt grip strength to improve lifting and holding heavy items.      Baseline  60#    Time  8    Period  Weeks    Status  On-going            Plan - 02/07/17 1405    Clinical Impression Statement  Pt with increased pain over the past 2 weeks due to lifting and moving a lot of boxes.  Pt remains weak in the Rt UE and this is improving.  Pt reports that he feels stronger and more endurance with use of Rt UE.  Pt with incresed grip strength measured last session.  Pt is reporting reduced Rt UE pain/radiculopathy and is responding well to cervical traction.  Pt will continue to benefit from skilled PT for UE strength/ endurance, flexibility and traction to address UE pain.      Rehab Potential  Good    PT Frequency  2x / week    PT Duration  8 weeks    PT Treatment/Interventions  ADLs/Self Care Home Management;Cryotherapy;Warden/ranger;Therapeutic activities;Therapeutic exercise;Neuromuscular re-education;Patient/family education;Manual techniques;Taping    PT Next Visit Plan  assess response to traction, cervical flexibility, Rt UE strength, grip strength    Recommended Other Services  initial certification is signed    Consulted and Agree with Plan of Care  Patient       Patient will benefit from skilled therapeutic intervention in order to improve the following deficits and impairments:  Pain, Impaired flexibility, Decreased activity tolerance, Decreased range of motion, Decreased strength, Postural dysfunction, Increased muscle spasms  Visit Diagnosis: Cervicalgia  Pain in right arm  Muscle weakness (generalized)  Other muscle spasm     Problem List There are no active problems to display for this patient.   [name], PT 02/07/17 2:37 PM  [facility] [address], [address] [place], [place], [address] Phone: [phone]   Fax:  [phone]  Name: ESROM SOY MRN: [id number] Date of Birth: 09-11-1970

## 2017-02-14 ENCOUNTER — Ambulatory Visit: Payer: BLUE CROSS/BLUE SHIELD

## 2017-02-14 DIAGNOSIS — M6281 Muscle weakness (generalized): Secondary | ICD-10-CM

## 2017-02-14 DIAGNOSIS — M542 Cervicalgia: Secondary | ICD-10-CM

## 2017-02-14 DIAGNOSIS — M62838 Other muscle spasm: Secondary | ICD-10-CM | POA: Diagnosis not present

## 2017-02-14 DIAGNOSIS — M79601 Pain in right arm: Secondary | ICD-10-CM | POA: Diagnosis not present

## 2017-02-14 NOTE — Patient Instructions (Addendum)
Abduction (Side-Lying)    Lie on left side. Raise arm above head. Keep palm forward. Repeat _10___ times per set. Do _2___ sets per session. Do __1-2__ sessions per day.  http://orth.exer.us/934   Copyright  VHI. All rights reserved.  External Rotation (Side-Lying)    Lie on left side, holding __ pound ball in top hand, elbow bent 90, towel roll between arm and body. Keeping elbow bent at side, rotate forearm upward. Repeat _10_ times. Repeat with other hand for set. Rest __ seconds after set. Do _2_ sets per session.  Copyright  VHI. All rights reserved.  Levator Scapula Stretch, Sitting    Sit, one hand tucked under hip on side to be stretched, other hand over top of head. Turn head toward other side and look down. Use hand on head to gently stretch neck in that position. Hold _20__ seconds. Repeat _3__ times per session. Do _3__ sessions per day.  Copyright  VHI. All rights reserved.  [facility] [address], [address] [place], [place] [address] Phone # [phone] Fax [phone]

## 2017-02-14 NOTE — Therapy (Signed)
Other Services  initial certification is signed, recertification sent 02/14/17    Consulted and Agree with Plan of Care  Patient       Patient will benefit from skilled therapeutic intervention in order to improve the following deficits and impairments:  Pain, Impaired flexibility, Decreased activity tolerance, Decreased range of motion, Decreased strength, Postural dysfunction, Increased muscle spasms  Visit Diagnosis: Cervicalgia - Plan: PT plan of care  cert/re-cert  Pain in right arm - Plan: PT plan of care cert/re-cert  Muscle weakness (generalized) - Plan: PT plan of care cert/re-cert  Other muscle spasm - Plan: PT plan of care cert/re-cert     Problem List There are no active problems to display for this patient.    [name], PT 02/14/17 2:43 PM   [facility] [address] [place], [place], [address] Phone: [phone]   Fax:  [phone]  Name: Henry Jennings MRN: [id number] Date of Birth: 11-29-1970  Other Services  initial certification is signed, recertification sent 02/14/17    Consulted and Agree with Plan of Care  Patient       Patient will benefit from skilled therapeutic intervention in order to improve the following deficits and impairments:  Pain, Impaired flexibility, Decreased activity tolerance, Decreased range of motion, Decreased strength, Postural dysfunction, Increased muscle spasms  Visit Diagnosis: Cervicalgia - Plan: PT plan of care  cert/re-cert  Pain in right arm - Plan: PT plan of care cert/re-cert  Muscle weakness (generalized) - Plan: PT plan of care cert/re-cert  Other muscle spasm - Plan: PT plan of care cert/re-cert     Problem List There are no active problems to display for this patient.    [name], PT 02/14/17 2:43 PM   [facility] [address] [place], [place], [address] Phone: [phone]   Fax:  [phone]  Name: Henry Jennings MRN: [id number] Date of Birth: 11-29-1970  [facility] [address] [place], [place], [address] Phone: [phone]   Fax:  [phone]  Physical Therapy Treatment  Patient Details  Name: Henry Jennings MRN: [id number] Date of Birth: April 28, 1970 Referring Provider: Shirlean Annaleigha Woo, MD   Encounter Date: 02/14/2017  PT End of Session - 02/14/17 1441    Visit Number  7    Date for PT Re-Evaluation  03/28/17    PT Start Time  1400    PT Stop Time  1450    PT Time Calculation (min)  50 min    Activity Tolerance  Patient tolerated treatment well    Behavior During Therapy  [facility] for tasks assessed/performed       Past Medical History:  Diagnosis Date  . Depression     History reviewed. No pertinent surgical history.  There were no vitals filed for this visit.  Subjective Assessment - 02/14/17 1401    Subjective  Pt reports 15% overall improvement since the start of care.  I havent been lifting as much this past week and I have been feeling better.      Currently in Pain?  Yes    Pain Score  4     Pain Location  Neck    Pain Descriptors / Indicators  Aching;Burning    Pain Type  Chronic pain    Pain Onset  More than a month ago    Pain Frequency  Constant    Aggravating Factors   lifting, hiking, sleeping on side    Pain Relieving Factors  rest, exercises, stretching, sleep on back         [facility] PT Assessment - 02/14/17 0001      Assessment   Medical Diagnosis  herniated nucleus pulposus, cervical, Rt      Observation/Other Assessments   Focus on Therapeutic Outcomes (FOTO)   44% limitation      Posture/Postural Control   Posture/Postural Control  No significant limitations      Strength   Right/Left Shoulder  Right    Right Shoulder Flexion  4+/5    Right Shoulder ABduction  4-/5    Right Shoulder Internal Rotation  4+/5    Right Shoulder External Rotation  4/5    Right Hand Grip (lbs)  58#                  [facility] Adult PT  Treatment/Exercise - 02/14/17 0001      Neck Exercises: Standing   Wall Push Ups  20 reps      Shoulder Exercises: Seated   Other Seated Exercises  3 way raises: 1# 2x10 seated on green ball      Shoulder Exercises: Sidelying   External Rotation  Strengthening;Right;20 reps;Weights    External Rotation Weight (lbs)  1    ABduction  Strengthening;Right;Weights;20 reps    ABduction Weight (lbs)  1      Shoulder Exercises: ROM/Strengthening   UBE (Upper Arm Bike)  Level 1x 6 minutes (3/3) PT present to do FOTO      Hand Exercises for Cervical Radiculopathy   Gross Grasp  green digiflex x 3 minutes      Modalities   Modalities  Traction      Traction   Type of Traction  Cervical    Min (lbs)  5    Max (lbs)  16    Hold Time  60    Rest Time  10    Time

## 2017-02-21 ENCOUNTER — Ambulatory Visit: Payer: BLUE CROSS/BLUE SHIELD

## 2017-02-21 DIAGNOSIS — M6281 Muscle weakness (generalized): Secondary | ICD-10-CM | POA: Diagnosis not present

## 2017-02-21 DIAGNOSIS — M79601 Pain in right arm: Secondary | ICD-10-CM | POA: Diagnosis not present

## 2017-02-21 DIAGNOSIS — M542 Cervicalgia: Secondary | ICD-10-CM

## 2017-02-21 DIAGNOSIS — M62838 Other muscle spasm: Secondary | ICD-10-CM | POA: Diagnosis not present

## 2017-02-21 NOTE — Therapy (Signed)
PT Long Term Goals - 02/14/17 1403      PT LONG TERM GOAL #1   Title  be independent in advanced HEP    Time  6    Period  Weeks    Status  On-going    Target Date  03/28/17      PT LONG TERM GOAL #2   Title  report a 50% reduction in Rt UE radiculopathy    Baseline  15% reported    Time  6    Period  Weeks    Status  On-going    Target Date  03/28/17      PT LONG TERM GOAL #3   Title  demonstrate > or = to 65# Rt grip strength to improve lifting and holding heavy items.      Baseline  58#    Time  6    Period  Weeks    Status  On-going    Target Date  03/28/17      PT LONG TERM GOAL #4   Title  report < or = to 2/10 cervical pain with turning head or sitting at the computer    Baseline  up to 6/10    Time  6    Period  Weeks    Status  On-going    Target Date  03/28/17      PT LONG TERM GOAL #5   Title  reduce FOTO to < or = to 38% limitation    Time  6    Period  Weeks    Status  New    Target Date  03/28/17            Plan - 02/21/17 1405    Clinical Impression Statement  Pt reports 15% overal improvement in Rt UE symptoms since the start of care.  Pt with some increase in Rt UE pain today due to weather changes.  Pt demonstrated improved Rt grip strength  to 58# and Rt UE strength has improved yet is still limited.  FOTO was improved to 44% last session.  Pt requires tactile cues for scapular position with exercise.  Pt will continue to benefit from skilled PT for manual therapy/dry needling, strength, flexibility and traction.      Rehab Potential  Good    PT Frequency  1x / week    PT Duration  6 weeks    PT Treatment/Interventions  ADLs/Self Care Home Management;Cryotherapy;Geologist, engineeringlectrical Stimulation;Ultrasound;Traction;Moist Heat;Stair training;Therapeutic activities;Therapeutic exercise;Neuromuscular re-education;Patient/family education;Manual techniques;Taping    PT Next Visit Plan  assess response to traction, cervical flexibility, Rt UE strength, grip strength.  Dry needling to neck if pt agrees.      Recommended Other Services  initial certification and recertification is signed    Consulted and Agree with Plan of Care  Patient       Patient will benefit from skilled therapeutic intervention in order to improve the following deficits and impairments:  Pain, Impaired flexibility, Decreased activity tolerance, Decreased range of motion, Decreased strength, Postural dysfunction, Increased muscle spasms  Visit Diagnosis: Cervicalgia  Pain in right arm  Muscle weakness (generalized)  Other muscle spasm     Problem List There are no active problems to display for this patient.    Lorrene ReidKelly Takacs, PT 02/21/17 2:44 PM  [facility] [address], [address] [place], [place], [address] Phone: [phone]   Fax:  [phone]  Name: Henry EarthlyBrian L Jennings MRN: [id number] Date of Birth: 07/24/1970  [facility] [address], [address] [place], [place], [address] Phone: [phone]   Fax:  [phone]  Physical Therapy Treatment  Patient Details  Name: Henry Jennings MRN: [id number] Date of Birth: 1970/08/24 Referring Provider: Shirlean Kelly, MD   Encounter Date: 02/21/2017  PT End of Session - 02/21/17 1441    Visit Number  8    Date for PT Re-Evaluation  03/28/17    PT Start Time  1400    PT Stop Time  1451    PT Time Calculation (min)  51 min    Activity Tolerance  Patient tolerated treatment well    Behavior During Therapy  [facility] for tasks assessed/performed       Past Medical History:  Diagnosis Date  . Depression     History reviewed. No pertinent surgical history.  There were no vitals filed for this visit.  Subjective Assessment - 02/21/17 1402    Subjective  I am doing OK this week.  I haven't been overdoing it.      Patient Stated Goals  reduce Rt UE radiculopathy, improve Rt UE strength, avoid surgery    Currently in Pain?  Yes    Pain Score  4     Pain Location  Neck    Pain Orientation  Right    Pain Descriptors / Indicators  Aching;Burning    Pain Onset  More than a month ago    Pain Frequency  Constant    Aggravating Factors   lifting, sleeping on side    Pain Relieving Factors  rest, exercises, stretching, sleep on back                      [facility] Adult PT Treatment/Exercise - 02/21/17 0001      Neck Exercises: Standing   Wall Push Ups  20 reps using ball on wall      Shoulder Exercises: Seated   Other Seated Exercises  3 way raises: 2# flexion and scaption, 1# abduction 3x5 seated on green ball      Shoulder Exercises: Sidelying   External Rotation  Strengthening;Right;20 reps;Weights    External Rotation Weight (lbs)  1    ABduction  Strengthening;Right;Weights;20 reps    ABduction Weight (lbs)  1      Shoulder Exercises: ROM/Strengthening   UBE (Upper Arm Bike)   Level 1x 6 minutes (3/3)      Shoulder Exercises: Power Radiation protection practitioner  20 reps 20#    Row  20 reps 20#      Hand Exercises for Cervical Radiculopathy   Gross Grasp  green digiflex x 3 minutes      Modalities   Modalities  Traction      Traction   Type of Traction  Cervical    Min (lbs)  5    Max (lbs)  17    Hold Time  60    Rest Time  10    Time  15      Neck Exercises: Stretches   Levator Stretch  Left;Right;3 reps;20 seconds               PT Short Term Goals - 02/14/17 1410      PT SHORT TERM GOAL #3   Title  demonstrate 4/5 Rt shoulder strength to improve use with ADLs and self-care    Baseline  4+/5 flexion and IR, 4-/5 abduction, 4/5 ER    Status  On-going

## 2017-02-28 ENCOUNTER — Ambulatory Visit: Payer: BLUE CROSS/BLUE SHIELD

## 2017-02-28 DIAGNOSIS — M6281 Muscle weakness (generalized): Secondary | ICD-10-CM | POA: Diagnosis not present

## 2017-02-28 DIAGNOSIS — M62838 Other muscle spasm: Secondary | ICD-10-CM

## 2017-02-28 DIAGNOSIS — M79601 Pain in right arm: Secondary | ICD-10-CM | POA: Diagnosis not present

## 2017-02-28 DIAGNOSIS — M542 Cervicalgia: Secondary | ICD-10-CM | POA: Diagnosis not present

## 2017-02-28 NOTE — Therapy (Signed)
[facility] [address], [address] [place], [place], [address] Phone: [phone]   Fax:  [phone]  Physical Therapy Treatment  Patient Details  Name: Henry Jennings MRN: [id number] Date of Birth: 1970-02-07 Referring Provider: Shirlean Montrelle Eddings, MD   Encounter Date: 02/28/2017  PT End of Session - 02/28/17 1449    Visit Number  9    Date for PT Re-Evaluation  03/28/17    Authorization Type  BCBS-30 visit limit    Authorization - Visit Number  8 used in 2019    Authorization - Number of Visits  30    PT Start Time  1405    PT Stop Time  1503    PT Time Calculation (min)  58 min    Activity Tolerance  Patient tolerated treatment well    Behavior During Therapy  [facility] for tasks assessed/performed       Past Medical History:  Diagnosis Date  . Depression     History reviewed. No pertinent surgical history.  There were no vitals filed for this visit.  Subjective Assessment - 02/28/17 1405    Subjective  Doing better.      Diagnostic tests  MRI: HNP C2-5    Patient Stated Goals  reduce Rt UE radiculopathy, improve Rt UE strength, avoid surgery    Currently in Pain?  Yes    Pain Score  3  3.5/10    Pain Location  Neck    Pain Orientation  Right    Pain Descriptors / Indicators  Aching;Burning    Pain Radiating Towards  Rt UE radiculopathy    Pain Onset  More than a month ago    Aggravating Factors   lifting, sleeping on side, weather changes    Pain Relieving Factors  rest, exercises, stretching, sleep on back                      [facility] Adult PT Treatment/Exercise - 02/28/17 0001      Shoulder Exercises: ROM/Strengthening   UBE (Upper Arm Bike)  Level 1x 6 minutes (3/3)      Shoulder Exercises: Power Radiation protection practitioner  20 reps 20#    Row  20 reps 20#      Modalities   Modalities  Traction      Traction   Type of Traction  Cervical    Min (lbs)  5    Max (lbs)  17    Hold Time  60    Rest  Time  10    Time  15      Manual Therapy   Manual Therapy  Soft tissue mobilization;Myofascial release    Manual therapy comments  soft tissue elongation and trigger point release to bil upper traps and levator, bil cervical paraspinals    Soft tissue mobilization  skilled palpation and assessment of tissue during dry needling      Neck Exercises: Stretches   Levator Stretch  Left;Right;3 reps;20 seconds       Trigger Point Dry Needling - 02/28/17 1410    Consent Given?  Yes    Education Handout Provided  Yes    Muscles Treated Upper Body  Upper trapezius;Levator scapulae    Upper Trapezius Response  Twitch reponse elicited;Palpable increased muscle length    Levator Scapulae Response  Twitch response elicited;Palpable increased muscle length           PT Education - 02/28/17 1412    Education  in right arm  Muscle weakness (generalized)  Other muscle spasm     Problem List There are no active problems to display for this patient.  [name], PT 02/28/17 2:52 PM  [facility] [address], [address] [place], [place], [address] Phone: [phone]   Fax:  [phone]  Name: Henry Jennings MRN: [id number] Date of Birth: 08/30/1970  [facility] [address], [address] [place], [place], [address] Phone: [phone]   Fax:  [phone]  Physical Therapy Treatment  Patient Details  Name: Henry Jennings MRN: [id number] Date of Birth: 1970-02-07 Referring Provider: Shirlean Jazzlyn Huizenga, MD   Encounter Date: 02/28/2017  PT End of Session - 02/28/17 1449    Visit Number  9    Date for PT Re-Evaluation  03/28/17    Authorization Type  BCBS-30 visit limit    Authorization - Visit Number  8 used in 2019    Authorization - Number of Visits  30    PT Start Time  1405    PT Stop Time  1503    PT Time Calculation (min)  58 min    Activity Tolerance  Patient tolerated treatment well    Behavior During Therapy  [facility] for tasks assessed/performed       Past Medical History:  Diagnosis Date  . Depression     History reviewed. No pertinent surgical history.  There were no vitals filed for this visit.  Subjective Assessment - 02/28/17 1405    Subjective  Doing better.      Diagnostic tests  MRI: HNP C2-5    Patient Stated Goals  reduce Rt UE radiculopathy, improve Rt UE strength, avoid surgery    Currently in Pain?  Yes    Pain Score  3  3.5/10    Pain Location  Neck    Pain Orientation  Right    Pain Descriptors / Indicators  Aching;Burning    Pain Radiating Towards  Rt UE radiculopathy    Pain Onset  More than a month ago    Aggravating Factors   lifting, sleeping on side, weather changes    Pain Relieving Factors  rest, exercises, stretching, sleep on back                      [facility] Adult PT Treatment/Exercise - 02/28/17 0001      Shoulder Exercises: ROM/Strengthening   UBE (Upper Arm Bike)  Level 1x 6 minutes (3/3)      Shoulder Exercises: Power Radiation protection practitioner  20 reps 20#    Row  20 reps 20#      Modalities   Modalities  Traction      Traction   Type of Traction  Cervical    Min (lbs)  5    Max (lbs)  17    Hold Time  60    Rest  Time  10    Time  15      Manual Therapy   Manual Therapy  Soft tissue mobilization;Myofascial release    Manual therapy comments  soft tissue elongation and trigger point release to bil upper traps and levator, bil cervical paraspinals    Soft tissue mobilization  skilled palpation and assessment of tissue during dry needling      Neck Exercises: Stretches   Levator Stretch  Left;Right;3 reps;20 seconds       Trigger Point Dry Needling - 02/28/17 1410    Consent Given?  Yes    Education Handout Provided  Yes    Muscles Treated Upper Body  Upper trapezius;Levator scapulae    Upper Trapezius Response  Twitch reponse elicited;Palpable increased muscle length    Levator Scapulae Response  Twitch response elicited;Palpable increased muscle length           PT Education - 02/28/17 1412    Education

## 2017-02-28 NOTE — Patient Instructions (Addendum)

## 2017-03-01 DIAGNOSIS — F411 Generalized anxiety disorder: Secondary | ICD-10-CM | POA: Diagnosis not present

## 2017-03-01 DIAGNOSIS — G4700 Insomnia, unspecified: Secondary | ICD-10-CM | POA: Diagnosis not present

## 2017-03-01 DIAGNOSIS — R5382 Chronic fatigue, unspecified: Secondary | ICD-10-CM | POA: Diagnosis not present

## 2017-03-01 DIAGNOSIS — H9202 Otalgia, left ear: Secondary | ICD-10-CM | POA: Diagnosis not present

## 2017-03-07 ENCOUNTER — Ambulatory Visit: Payer: BLUE CROSS/BLUE SHIELD | Attending: Neurosurgery

## 2017-03-07 DIAGNOSIS — M79601 Pain in right arm: Secondary | ICD-10-CM | POA: Insufficient documentation

## 2017-03-07 DIAGNOSIS — M542 Cervicalgia: Secondary | ICD-10-CM | POA: Diagnosis not present

## 2017-03-07 DIAGNOSIS — M6281 Muscle weakness (generalized): Secondary | ICD-10-CM | POA: Diagnosis not present

## 2017-03-07 DIAGNOSIS — M62838 Other muscle spasm: Secondary | ICD-10-CM | POA: Diagnosis not present

## 2017-03-07 NOTE — Therapy (Signed)
MRN: [id number] Date of Birth: [birth year]  [facility] [address] [place], [place], [address] Phone: [phone]   Fax:  [phone]  Physical Therapy Treatment  Patient Details  Name: Henry Jennings MRN: [id number] Date of Birth: [birth year] Referring Provider: Shirlean Emila Steinhauser, MD   Encounter Date: 03/07/2017  PT End of Session - 03/07/17 1440    Visit Number  10    Date for PT Re-Evaluation  03/28/17    Authorization Type  BCBS-30 visit limit    Authorization - Visit Number  10    Authorization - Number of Visits  30    PT Start Time  1402    PT Stop Time  1449    PT Time Calculation (min)  47 min    Activity Tolerance  Patient tolerated treatment well    Behavior During Therapy  [facility] for tasks assessed/performed       Past Medical History:  Diagnosis Date  . Depression     History reviewed. No pertinent surgical history.  There were no vitals filed for this visit.  Subjective Assessment - 03/07/17 1400    Subjective  The dry needling really helped.  I haven't felt this good in my neck for years.      Diagnostic tests  MRI: HNP C2-5    Patient Stated Goals  reduce Rt UE radiculopathy, improve Rt UE strength, avoid surgery    Currently in Pain?  Yes    Pain Score  4  over the weekend 2/10    Pain Orientation  Right    Pain Descriptors / Indicators  Aching    Pain Type  Chronic pain    Pain Onset  More than a month ago    Pain Frequency  Constant    Aggravating Factors   lifting, repetative activity, weather changes    Pain Relieving Factors  rest, exercises, dry needling                      [facility] Adult PT Treatment/Exercise - 03/07/17 0001      Shoulder Exercises: ROM/Strengthening   UBE (Upper Arm Bike)  Level 1x 6 minutes (3/3)      Modalities   Modalities  Traction      Traction   Type of Traction  Cervical    Min (lbs)  5    Max (lbs)  17    Hold Time  60    Rest Time  10    Time  15      Manual Therapy   Manual Therapy  Soft tissue mobilization;Myofascial release    Manual therapy comments  soft tissue elongation and trigger point release to bil upper traps and levator, bil cervical paraspinals    Soft tissue mobilization  skilled palpation and assessment of tissue during dry needling       Trigger Point Dry Needling - 03/07/17 1408    Consent Given?  Yes    Muscles Treated Upper Body  Upper trapezius;Levator scapulae;Suboccipitals muscle group bil cervical multifidi    Upper Trapezius Response  Twitch reponse elicited;Palpable increased muscle length    SubOccipitals Response  Twitch response elicited;Palpable increased muscle length    Levator Scapulae Response  Twitch response elicited;Palpable increased muscle length             PT Short Term Goals - 02/14/17 1410      PT SHORT TERM GOAL #3   Title  demonstrate 4/5 Rt shoulder strength to improve use with  [facility] [address] [place], [place], [address] Phone: [phone]   Fax:  [phone]  Physical Therapy Treatment  Patient Details  Name: Henry Jennings MRN: [id number] Date of Birth: [birth year] Referring Provider: Shirlean Halsey Hammen, MD   Encounter Date: 03/07/2017  PT End of Session - 03/07/17 1440    Visit Number  10    Date for PT Re-Evaluation  03/28/17    Authorization Type  BCBS-30 visit limit    Authorization - Visit Number  10    Authorization - Number of Visits  30    PT Start Time  1402    PT Stop Time  1449    PT Time Calculation (min)  47 min    Activity Tolerance  Patient tolerated treatment well    Behavior During Therapy  [facility] for tasks assessed/performed       Past Medical History:  Diagnosis Date  . Depression     History reviewed. No pertinent surgical history.  There were no vitals filed for this visit.  Subjective Assessment - 03/07/17 1400    Subjective  The dry needling really helped.  I haven't felt this good in my neck for years.      Diagnostic tests  MRI: HNP C2-5    Patient Stated Goals  reduce Rt UE radiculopathy, improve Rt UE strength, avoid surgery    Currently in Pain?  Yes    Pain Score  4  over the weekend 2/10    Pain Orientation  Right    Pain Descriptors / Indicators  Aching    Pain Type  Chronic pain    Pain Onset  More than a month ago    Pain Frequency  Constant    Aggravating Factors   lifting, repetative activity, weather changes    Pain Relieving Factors  rest, exercises, dry needling                      [facility] Adult PT Treatment/Exercise - 03/07/17 0001      Shoulder Exercises: ROM/Strengthening   UBE (Upper Arm Bike)  Level 1x 6 minutes (3/3)      Modalities   Modalities  Traction      Traction   Type of Traction  Cervical    Min (lbs)  5    Max (lbs)  17    Hold Time  60    Rest Time  10    Time  15      Manual Therapy   Manual Therapy  Soft tissue mobilization;Myofascial release    Manual therapy comments  soft tissue elongation and trigger point release to bil upper traps and levator, bil cervical paraspinals    Soft tissue mobilization  skilled palpation and assessment of tissue during dry needling       Trigger Point Dry Needling - 03/07/17 1408    Consent Given?  Yes    Muscles Treated Upper Body  Upper trapezius;Levator scapulae;Suboccipitals muscle group bil cervical multifidi    Upper Trapezius Response  Twitch reponse elicited;Palpable increased muscle length    SubOccipitals Response  Twitch response elicited;Palpable increased muscle length    Levator Scapulae Response  Twitch response elicited;Palpable increased muscle length             PT Short Term Goals - 02/14/17 1410      PT SHORT TERM GOAL #3   Title  demonstrate 4/5 Rt shoulder strength to improve use with

## 2017-03-14 ENCOUNTER — Ambulatory Visit: Payer: BLUE CROSS/BLUE SHIELD

## 2017-03-14 DIAGNOSIS — M542 Cervicalgia: Secondary | ICD-10-CM

## 2017-03-14 DIAGNOSIS — M79601 Pain in right arm: Secondary | ICD-10-CM | POA: Diagnosis not present

## 2017-03-14 DIAGNOSIS — M6281 Muscle weakness (generalized): Secondary | ICD-10-CM | POA: Diagnosis not present

## 2017-03-14 DIAGNOSIS — M62838 Other muscle spasm: Secondary | ICD-10-CM | POA: Diagnosis not present

## 2017-03-14 NOTE — Therapy (Signed)
[facility] [address], [address] [place], [place], [address] Phone: [phone]   Fax:  [phone]  Physical Therapy Treatment  Patient Details  Name: Henry Jennings MRN: [id number] Date of Birth: [birth year] Referring Provider: Shirlean Rielle Schlauch, MD   Encounter Date: 03/14/2017  Henry Jennings End of Session - 03/14/17 1452    Visit Number  11    Date for Henry Jennings Re-Evaluation  03/28/17    Authorization Type  BCBS-30 visit limit    Authorization - Visit Number  11    Authorization - Number of Visits  30    Henry Jennings Start Time  1403    Henry Jennings Stop Time  1505    Henry Jennings Time Calculation (min)  62 min    Activity Tolerance  Patient tolerated treatment well    Behavior During Therapy  [facility] for tasks assessed/performed       Past Medical History:  Diagnosis Date  . Depression     History reviewed. No pertinent surgical history.  There were no vitals filed for this visit.  Subjective Assessment - 03/14/17 1401    Subjective  The dry needling helped for longer than the last time.    Diagnostic tests  MRI: HNP C2-5    Patient Stated Goals  reduce Rt UE radiculopathy, improve Rt UE strength, avoid surgery    Currently in Pain?  Yes    Pain Score  3     Pain Location  Neck    Pain Orientation  Right    Pain Descriptors / Indicators  Aching    Pain Type  Chronic pain    Pain Onset  More than a month ago    Pain Frequency  Constant    Aggravating Factors   lifting, repetative activity    Pain Relieving Factors  rest, exercises, dry needling         [facility] Henry Jennings Assessment - 03/14/17 0001      Strength   Right Hand Grip (lbs)  57#    Left Hand Grip (lbs)  66#                  [facility] Adult Henry Jennings Treatment/Exercise - 03/14/17 0001      Shoulder Exercises: Seated   Other Seated Exercises  3 way raises: 2# flexion and scaption, 1# abduction 3x5      Shoulder Exercises: ROM/Strengthening   UBE (Upper Arm Bike)  Level 1x 6 minutes (3/3) Henry Jennings  present to discuss progress      Shoulder Exercises: Power Radiation protection practitioner  20 reps 20#    Row  20 reps 20#      Modalities   Modalities  Traction      Traction   Type of Traction  Cervical    Min (lbs)  5    Max (lbs)  17    Hold Time  60    Rest Time  10    Time  15      Manual Therapy   Manual Therapy  --    Manual therapy comments  --    Soft tissue mobilization  skilled palpation and assessment of tissue during dry needling       Trigger Point Dry Needling - 03/14/17 1420    Consent Given?  Yes    Muscles Treated Upper Body  Upper trapezius;Levator scapulae;Suboccipitals muscle group cervical multifidi bil, Rt posterior and lateral deltoid    Upper Trapezius Response  Twitch reponse elicited;Palpable increased muscle length  SubOccipitals Response  Twitch response elicited;Palpable increased muscle length    Levator Scapulae Response  Twitch response elicited;Palpable increased muscle length             Henry Jennings Short Term Goals - 03/14/17 1406      Henry Jennings SHORT TERM GOAL #2   Title  report a 25% reduction in Rt UE radiculopathy    Baseline  30%     Status  Achieved        Henry Jennings Long Term Goals - 03/14/17 1407      Henry Jennings LONG TERM GOAL #1   Title  be independent in advanced HEP    Time  6    Period  Weeks    Status  On-going      Henry Jennings LONG TERM GOAL #2   Title  report a 50% reduction in Rt UE radiculopathy    Baseline  30% reduction    Time  6    Period  Weeks    Status  On-going      Henry Jennings LONG TERM GOAL #3   Title  demonstrate > or = to 65# Rt grip strength to improve lifting and holding heavy items.        Henry Jennings LONG TERM GOAL #5   Title  reduce FOTO to < or = to 38% limitation    Period  Weeks    Status  On-going            Plan - 03/14/17 1408    Clinical Impression Statement  Henry Jennings reports 60% overall improvement in symptoms since the start of care in the neck.  Henry Jennings reports 30% overall reduction in Rt UE radiculpathy since the start of care.  Henry Jennings with  tension in bil cervical suboccipitals, cervial paraspinals and upper traps and demonstrated improved tissue mobility and reduced pain after dry needling and manual therapy today.  Henry Jennings is able to do 3 way raises with 2# now without scapular substitution.  Henry Jennings will continue to benefit from UE strength, dry needling and traction to address stiffness, weakness and Rt UE radiculopathy.    Rehab Potential  Good    Henry Jennings Frequency  1x / week    Henry Jennings Duration  6 weeks    Henry Jennings Treatment/Interventions  ADLs/Self Care Home Management;Cryotherapy;Geologist, engineering;Therapeutic activities;Therapeutic exercise;Neuromuscular re-education;Patient/family education;Manual techniques;Taping    Henry Jennings Next Visit Plan  assess response to traction and dry needling, repeat if helpful cervical flexibility, Rt UE strength, grip strength.  See how needling to Rt shoulder helped with pain.    Consulted and Agree with Plan of Care  Patient       Patient will benefit from skilled therapeutic intervention in order to improve the following deficits and impairments:  Pain, Impaired flexibility, Decreased activity tolerance, Decreased range of motion, Decreased strength, Postural dysfunction, Increased muscle spasms  Visit Diagnosis: Cervicalgia  Pain in right arm  Muscle weakness (generalized)  Other muscle spasm     Problem List There are no active problems to display for this patient.   Henry Jennings, Henry Jennings 03/14/17 2:56 PM  [facility] [address], [address] [place], [place], [address] Phone: [phone]   Fax:  [phone]  Name: Henry Jennings MRN: [id number] Date of Birth: 12-07-1970

## 2017-03-21 ENCOUNTER — Ambulatory Visit: Payer: BLUE CROSS/BLUE SHIELD | Admitting: Physical Therapy

## 2017-03-21 DIAGNOSIS — M79601 Pain in right arm: Secondary | ICD-10-CM

## 2017-03-21 DIAGNOSIS — M542 Cervicalgia: Secondary | ICD-10-CM

## 2017-03-21 DIAGNOSIS — M62838 Other muscle spasm: Secondary | ICD-10-CM

## 2017-03-21 DIAGNOSIS — M6281 Muscle weakness (generalized): Secondary | ICD-10-CM | POA: Diagnosis not present

## 2017-03-21 NOTE — Therapy (Signed)
[facility] [address] [place], [place], [address] Phone: [phone]   Fax:  [phone]  Physical Therapy Treatment  Patient Details  Name: Henry Jennings MRN: [id number] Date of Birth: [birth year] Referring Provider: Shirlean Kelly, MD   Encounter Date: 03/21/2017  PT End of Session - 03/21/17 1446    Visit Number  12    Date for PT Re-Evaluation  03/28/17    Authorization Type  BCBS-30 visit limit    Authorization - Visit Number  12    Authorization - Number of Visits  30    PT Start Time  1402    PT Stop Time  1456 3 units due to dry needling    PT Time Calculation (min)  54 min    Activity Tolerance  Patient tolerated treatment well    Behavior During Therapy  [facility] for tasks assessed/performed       Past Medical History:  Diagnosis Date  . Depression     No past surgical history on file.  There were no vitals filed for this visit.  Subjective Assessment - 03/21/17 1404    Subjective  My neck is feeling pretty good, my shoulder is hurting this week more than my neck.      Diagnostic tests  MRI: HNP C2-5    Patient Stated Goals  reduce Rt UE radiculopathy, improve Rt UE strength, avoid surgery    Currently in Pain?  Yes    Pain Score  3     Pain Location  Shoulder    Pain Orientation  Right    Pain Descriptors / Indicators  Aching    Pain Type  Chronic pain    Pain Onset  More than a month ago    Pain Frequency  Constant    Multiple Pain Sites  No                      [facility] Adult PT Treatment/Exercise - 03/21/17 0001      Neck Exercises: Seated   Other Seated Exercise  SCM stretch - 2x 10 sec hold each side      Shoulder Exercises: ROM/Strengthening   UBE (Upper Arm Bike)  Level 1x 6 minutes (3/3) PT present to discuss progress      Traction   Type of Traction  Cervical    Min (lbs)  5    Max (lbs)  17    Hold Time  60    Rest Time  10    Time  15      Manual Therapy    Manual therapy comments  soft tissue subocciptials, SCM bilaterally    Soft tissue mobilization  skilled palpation and assessment of tissue during dry needling      pec stretch in the doorway - 2 x 20 sec each side Discussed posture   Trigger Point Dry Needling - 03/21/17 1444    Consent Given?  Yes    Muscles Treated Upper Body  -- cervical multifidi    Upper Trapezius Response  Twitch reponse elicited;Palpable increased muscle length    SubOccipitals Response  Twitch response elicited;Palpable increased muscle length    Levator Scapulae Response  Twitch response elicited;Palpable increased muscle length             PT Short Term Goals - 03/14/17 1406      PT SHORT TERM GOAL #2   Title  report a 25% reduction in Rt UE radiculopathy  Baseline  30%     Status  Achieved        PT Long Term Goals - 03/14/17 1407      PT LONG TERM GOAL #1   Title  be independent in advanced HEP    Time  6    Period  Weeks    Status  On-going      PT LONG TERM GOAL #2   Title  report a 50% reduction in Rt UE radiculopathy    Baseline  30% reduction    Time  6    Period  Weeks    Status  On-going      PT LONG TERM GOAL #3   Title  demonstrate > or = to 65# Rt grip strength to improve lifting and holding heavy items.        PT LONG TERM GOAL #5   Title  reduce FOTO to < or = to 38% limitation    Period  Weeks    Status  On-going            Plan - 03/21/17 1524    Clinical Impression Statement  Patient states he hasn't had much neck pain and denies neck pain today.  States pain is more in his shoulder. Pt has minimal muscle spasms that were release with manual and dry needling today.  Pt will benefit from skilled PT to continue working on decreased stiffness and improved strength and posture.    PT Treatment/Interventions  ADLs/Self Care Home Management;Cryotherapy;Geologist, engineering;Therapeutic activities;Therapeutic  exercise;Neuromuscular re-education;Patient/family education;Manual techniques;Taping    PT Next Visit Plan  assess response to traction and dry needling, repeat if helpful cervical flexibility, Rt UE strength, grip strength.  See how needling to Rt shoulder helped with pain.    Consulted and Agree with Plan of Care  Patient       Patient will benefit from skilled therapeutic intervention in order to improve the following deficits and impairments:  Pain, Impaired flexibility, Decreased activity tolerance, Decreased range of motion, Decreased strength, Postural dysfunction, Increased muscle spasms  Visit Diagnosis: Cervicalgia  Pain in right arm  Muscle weakness (generalized)  Other muscle spasm     Problem List There are no active problems to display for this patient.   Vincente Poli, PT 03/21/2017, 3:28 PM  [facility] [address], [address] [place], [place], [address] Phone: [phone]   Fax:  [phone]  Name: Henry Jennings MRN: [id number] Date of Birth: September 27, 1970

## 2017-03-28 ENCOUNTER — Ambulatory Visit: Payer: BLUE CROSS/BLUE SHIELD

## 2017-03-28 DIAGNOSIS — M62838 Other muscle spasm: Secondary | ICD-10-CM

## 2017-03-28 DIAGNOSIS — M542 Cervicalgia: Secondary | ICD-10-CM | POA: Diagnosis not present

## 2017-03-28 DIAGNOSIS — M79601 Pain in right arm: Secondary | ICD-10-CM | POA: Diagnosis not present

## 2017-03-28 DIAGNOSIS — M6281 Muscle weakness (generalized): Secondary | ICD-10-CM | POA: Diagnosis not present

## 2017-03-28 NOTE — Therapy (Signed)
Diagnosis: Cervicalgia - Plan: PT plan of care cert/re-cert  Muscle weakness (generalized) - Plan: PT plan of care cert/re-cert  Other muscle spasm - Plan: PT plan of care cert/re-cert  Pain in right arm - Plan: PT plan of care cert/re-cert     Problem List There are no active problems to display for this patient.    [name], PT 03/28/17 2:55 PM  [facility] [address] [place], [place], [address] Phone: [phone]   Fax:  [phone]  Name: Henry Jennings MRN: [id number] Date of Birth: [birth year]  Diagnosis: Cervicalgia - Plan: PT plan of care cert/re-cert  Muscle weakness (generalized) - Plan: PT plan of care cert/re-cert  Other muscle spasm - Plan: PT plan of care cert/re-cert  Pain in right arm - Plan: PT plan of care cert/re-cert     Problem List There are no active problems to display for this patient.    [name], PT 03/28/17 2:55 PM  [facility] [address] [place], [place], [address] Phone: [phone]   Fax:  [phone]  Name: Henry Jennings MRN: [id number] Date of Birth: [birth year]  [facility] [address] [place], [place], [address] Phone: [phone]   Fax:  [phone]  Physical Therapy Treatment  Patient Details  Name: Henry Jennings MRN: [id number] Date of Birth: 1970-12-17 Referring Provider: Shirlean KellyNudelman, Robert, MD   Encounter Date: 03/28/2017  PT End of Session - 03/28/17 1448    Visit Number  13    Date for PT Re-Evaluation  05/09/17    Authorization Type  BCBS-30 visit limit    Authorization - Visit Number  13    Authorization - Number of Visits  30    PT Start Time  1401    PT Stop Time  1500    PT Time Calculation (min)  59 min    Activity Tolerance  Patient tolerated treatment well    Behavior During Therapy  [facility] for tasks assessed/performed       Past Medical History:  Diagnosis Date  . Depression     History reviewed. No pertinent surgical history.  There were no vitals filed for this visit.  Subjective Assessment - 03/28/17 1404    Subjective  The right side of my neck is feeling tight again.  Rt shoulder is hurting sometimes too.      Diagnostic tests  MRI: HNP C2-5    Currently in Pain?  Yes    Pain Score  3     Pain Location  Shoulder    Pain Orientation  Right    Pain Descriptors / Indicators  Aching    Pain Type  Chronic pain    Pain Onset  More than a month ago    Pain Frequency  Constant    Aggravating Factors   lifting, repetative activity    Pain Relieving Factors  rest, exercises, traction, dry needling         [facility] PT Assessment - 03/28/17 0001      Assessment   Medical Diagnosis  herniated nucleus pulposus, cervical, Rt      Observation/Other Assessments   Focus on Therapeutic Outcomes (FOTO)   41% limitation      Posture/Postural Control   Posture/Postural Control  No significant limitations      Strength   Right/Left Shoulder  Right    Right Shoulder Flexion  4+/5    Right Shoulder ABduction  4/5    Right Shoulder Internal Rotation   4+/5    Right Shoulder External Rotation  4+/5    Right Hand Grip (lbs)  56#    Left Hand Grip (lbs)  60#            No data recorded       [facility] Adult PT Treatment/Exercise - 03/28/17 0001      Shoulder Exercises: ROM/Strengthening   UBE (Upper Arm Bike)  Level 1x 6 minutes (3/3) PT present to discuss progress      Traction   Type of Traction  Cervical    Min (lbs)  5    Max (lbs)  17    Hold Time  60    Rest Time  10    Time  15      Manual Therapy   Manual therapy comments  soft tissue subocciptials, cervical paraspinals    Soft tissue mobilization  skilled palpation and assessment of tissue during dry needling       Trigger Point Dry Needling - 03/28/17 1447    Consent Given?  Yes    Muscles Treated Upper Body  -- bil

## 2017-04-04 ENCOUNTER — Encounter: Payer: Self-pay | Admitting: Physical Therapy

## 2017-04-04 ENCOUNTER — Ambulatory Visit: Payer: BLUE CROSS/BLUE SHIELD | Attending: Neurosurgery | Admitting: Physical Therapy

## 2017-04-04 DIAGNOSIS — M542 Cervicalgia: Secondary | ICD-10-CM | POA: Diagnosis not present

## 2017-04-04 DIAGNOSIS — M6281 Muscle weakness (generalized): Secondary | ICD-10-CM | POA: Diagnosis not present

## 2017-04-04 DIAGNOSIS — M62838 Other muscle spasm: Secondary | ICD-10-CM | POA: Insufficient documentation

## 2017-04-04 DIAGNOSIS — M79601 Pain in right arm: Secondary | ICD-10-CM

## 2017-04-04 NOTE — Therapy (Addendum)
[facility] [address] [place], [place], [address] Phone: [phone]   Fax:  [phone]  Physical Therapy Treatment  Patient Details  Name: LAKSHYA MARTINIE MRN: [id number] Date of Birth: 1970-07-08 Referring Provider: Shirlean Kelly, MD   Encounter Date: 04/04/2017  PT End of Session - 04/04/17 1528    Visit Number  14    Date for PT Re-Evaluation  05/09/17    Authorization Type  BCBS-30 visit limit    Authorization - Visit Number  14    Authorization - Number of Visits  30    PT Start Time  1528    PT Stop Time  1620    PT Time Calculation (min)  52 min    Activity Tolerance  Patient tolerated treatment well    Behavior During Therapy  [facility] for tasks assessed/performed       Past Medical History:  Diagnosis Date  . Depression     History reviewed. No pertinent surgical history.  There were no vitals filed for this visit.  Subjective Assessment - 04/04/17 1710    Subjective  I want to know how to keep the tightness from returning.  It feels better but seems to keep tightening up in the neck and shoulder.    Patient Stated Goals  reduce Rt UE radiculopathy, improve Rt UE strength, avoid surgery    Currently in Pain?  Yes    Pain Score  3                        [facility] Adult PT Treatment/Exercise - 04/04/17 0001      Self-Care   Self-Care  Posture    Posture  reviewed and educated in finding ne      Neuro Re-ed    Neuro Re-ed Details   cues for postural alignment with all exercises      Neck Exercises: Seated   Neck Retraction  10 reps;5 secs    Other Seated Exercise  cervical retraction with extension (only within available ROM)- 10x      Shoulder Exercises: Seated   Other Seated Exercises  3 way raises: 2# flexion and scaption, 2# abduction 3x5      Shoulder Exercises: ROM/Strengthening   UBE (Upper Arm Bike)  Level 1x 6 minutes (3/3) PT present to discuss progress      Manual Therapy   Manual therapy comments  soft tissue subocciptials, cervical paraspinals    Soft tissue mobilization  skilled palpation and assessment of tissue during dry needling       Trigger Point Dry Needling - 04/04/17 1708    Consent Given?  Yes    Muscles Treated Upper Body  -- bilateral cervical multifidi; right deltoid        Traction (mechanical): cervical, min 5 lb, max 17, 60 sec hold, 10 sec rest, 15 min     PT Short Term Goals - 03/14/17 1406      PT SHORT TERM GOAL #2   Title  report a 25% reduction in Rt UE radiculopathy    Baseline  30%     Status  Achieved        PT Long Term Goals - 03/28/17 1413      PT LONG TERM GOAL #1   Title  be independent in advanced HEP    Time  6    Period  Weeks    Status  On-going    Target Date  05/09/17      PT LONG TERM GOAL #2   Title  report a 75% reduction in Rt UE radiculopathy    Baseline  50% improvement    Time  6    Period  Weeks    Status  Revised    Target Date  05/23/17      PT LONG TERM GOAL #3   Title  demonstrate > or = to 65# Rt grip strength to improve lifting and holding heavy items.      Time  6    Period  Weeks    Status  On-going    Target Date  05/09/17      PT LONG TERM GOAL #4   Title  report < or = to 2/10 cervical pain with turning head or sitting at the computer    Baseline  2/10 with turning head, 4/10 with computer use    Time  6    Period  Weeks    Status  On-going    Target Date  05/09/17      PT LONG TERM GOAL #5   Title  reduce FOTO to < or = to 38% limitation    Time  6    Period  Weeks    Status  On-going    Target Date  05/09/17            Plan - 04/04/17 1714    Clinical Impression Statement  Patient is maintaining overall improvement, but feels like he is still have recurring muscle spasms.  PT emphasized postural adjustments and strengthening for greater deep neck flexor activation as well as core activation to maintain improved alignment.  Pt was able  to demonstrate and feel a difference in tension in his neck with changes in posture.  Pt reports feeling looser after dry needling, manual and traction today. Pt will benefit from skilled therapy for postural and Rt UE strengthening to maintain improvements in function.    PT Treatment/Interventions  ADLs/Self Care Home Management;Cryotherapy;Geologist, engineering;Therapeutic activities;Therapeutic exercise;Neuromuscular re-education;Patient/family education;Manual techniques;Taping    PT Next Visit Plan  assess response to traction and dry needling, 1 more sessoin of dry needling if needed. repeat if helpful cervical flexibility, Rt UE strength, grip strength. review cervical retraction with extension    Recommended Other Services  recert signed 4/0/98    Consulted and Agree with Plan of Care  Patient       Patient will benefit from skilled therapeutic intervention in order to improve the following deficits and impairments:  Pain, Impaired flexibility, Decreased activity tolerance, Decreased range of motion, Decreased strength, Postural dysfunction, Increased muscle spasms  Visit Diagnosis: Cervicalgia  Muscle weakness (generalized)  Other muscle spasm  Pain in right arm     Problem List There are no active problems to display for this patient.   Sallyanne Havers Crosser 04/04/2017, 5:33 PM  [facility] [address] [place], [place], [address] Phone: [phone]   Fax:  [phone]  Name: MAYES SCHUDEL MRN: [id number] Date of Birth: August 01, 1970

## 2017-04-11 ENCOUNTER — Ambulatory Visit: Payer: BLUE CROSS/BLUE SHIELD

## 2017-04-11 DIAGNOSIS — M542 Cervicalgia: Secondary | ICD-10-CM | POA: Diagnosis not present

## 2017-04-11 DIAGNOSIS — M6281 Muscle weakness (generalized): Secondary | ICD-10-CM | POA: Diagnosis not present

## 2017-04-11 DIAGNOSIS — M79601 Pain in right arm: Secondary | ICD-10-CM

## 2017-04-11 DIAGNOSIS — M62838 Other muscle spasm: Secondary | ICD-10-CM

## 2017-04-11 NOTE — Therapy (Signed)
2:52 PM  [facility] [address] [place], [place],  [address] Phone: [phone]   Fax:  [phone]  Name: Henry Jennings MRN: [id number] Date of Birth: [birth year]  2:52 PM  [facility] [address] [place], [place],  [address] Phone: [phone]   Fax:  [phone]  Name: Henry Jennings MRN: [id number] Date of Birth: [birth year]  [facility] [address] [place], [place], [address] Phone: [phone]   Fax:  [phone]  Physical Therapy Treatment  Patient Details  Name: Henry Jennings MRN: [id number] Date of Birth: 06-Jun-1970 Referring Provider: Shirlean Jeremiah Tarpley, MD   Encounter Date: 04/11/2017  PT End of Session - 04/11/17 1449    Visit Number  15    Date for PT Re-Evaluation  05/09/17    Authorization Type  BCBS-30 visit limit    Authorization - Visit Number  15    Authorization - Number of Visits  30    PT Start Time  1400    PT Stop Time  1502    PT Time Calculation (min)  62 min    Activity Tolerance  Patient tolerated treatment well    Behavior During Therapy  [facility] for tasks assessed/performed       Past Medical History:  Diagnosis Date  . Depression     History reviewed. No pertinent surgical history.  There were no vitals filed for this visit.  Subjective Assessment - 04/11/17 1401    Subjective  I am feeling better.  It is easier to turn my head.      Patient Stated Goals  reduce Rt UE radiculopathy, improve Rt UE strength, avoid surgery    Currently in Pain?  Yes    Pain Score  3     Pain Location  Shoulder    Pain Orientation  Right    Pain Descriptors / Indicators  Aching    Pain Type  Chronic pain    Pain Onset  More than a month ago    Pain Frequency  Constant    Aggravating Factors   lifting, repetative activity, looking down    Pain Relieving Factors  rest, exercise, traction, dry needling                       [facility] Adult PT Treatment/Exercise - 04/11/17 0001      Neck Exercises: Seated   Other Seated Exercise  cervical retraction with extension (only within available ROM)- 10x      Shoulder Exercises: Seated   Other Seated Exercises  3 way raises: 2# flexion and scaption, 2# abduction 3x5      Shoulder Exercises: Standing   Horizontal ABduction  Strengthening;Both;20 reps;Theraband    Theraband Level (Shoulder Horizontal ABduction)  Level 3 (Green)    External Rotation  Strengthening;Both;20 reps;Theraband    Row  Strengthening;Both;20 reps;Theraband    Theraband Level (Shoulder Row)  Level 3 (Green)      Shoulder Exercises: ROM/Strengthening   UBE (Upper Arm Bike)  Level 1x 6 minutes (3/3) PT present to discuss progress      Traction   Type of Traction  Cervical    Min (lbs)  5    Max (lbs)  17    Hold Time  60    Rest Time  10    Time  15      Manual Therapy   Manual therapy comments  soft tissue subocciptials, cervical paraspinals    Soft tissue mobilization  skilled palpation and assessment of tissue during dry needling       Trigger Point Dry Needling - 04/11/17 1412    Consent Given?  Yes    Muscles Treated Upper Body  Suboccipitals muscle group cervical multifidi    SubOccipitals Response  Twitch response elicited;Palpable increased muscle length

## 2017-04-25 ENCOUNTER — Ambulatory Visit: Payer: BLUE CROSS/BLUE SHIELD

## 2017-04-25 DIAGNOSIS — M6281 Muscle weakness (generalized): Secondary | ICD-10-CM | POA: Diagnosis not present

## 2017-04-25 DIAGNOSIS — M79601 Pain in right arm: Secondary | ICD-10-CM

## 2017-04-25 DIAGNOSIS — M62838 Other muscle spasm: Secondary | ICD-10-CM | POA: Diagnosis not present

## 2017-04-25 DIAGNOSIS — M542 Cervicalgia: Secondary | ICD-10-CM | POA: Diagnosis not present

## 2017-04-25 NOTE — Patient Instructions (Signed)
Access Code: [email or web address]  URL: [email or web address]  Date: 04/25/2017  Prepared by: Lorrene ReidKelly Takacs   Exercises  Standard Plank - 5 reps - 10 hold - 1x daily - 7x weekly  Wall Angels - 5 reps - 3x daily - 7x weekly   [facility] [address], [address] [place], [place] [address] Phone # [phone] Fax [phone]

## 2017-04-25 NOTE — Therapy (Signed)
intervention in order to improve the following deficits and impairments:  Pain, Impaired flexibility, Decreased activity tolerance, Decreased range of motion, Decreased strength, Postural dysfunction, Increased muscle spasms  Visit Diagnosis: Muscle weakness (generalized)  Cervicalgia  Pain in right arm     Problem List There are no active problems to display for this patient.   [name], PT 04/25/17 2:38 PM  [facility] [address], [address] [place], [place], [address] Phone: [phone]   Fax:  [phone]  Name: Henry Jennings MRN: [id number] Date of Birth: [birth year]  [facility] [address], [address] [place], [place], [address] Phone: [phone]   Fax:  [phone]  Physical Therapy Treatment  Patient Details  Name: Henry EarthlyBrian L Calico MRN: [id number] Date of Birth: 01/24/1970 Referring Provider: Shirlean KellyNudelman, Robert, MD   Encounter Date: 04/25/2017  PT End of Session - 04/25/17 1435    Visit Number  16    Date for PT Re-Evaluation  05/09/17    Authorization Type  BCBS-30 visit limit    Authorization - Visit Number  16    Authorization - Number of Visits  30    PT Start Time  1400    PT Stop Time  1450    PT Time Calculation (min)  50 min    Activity Tolerance  Patient tolerated treatment well    Behavior During Therapy  [facility] for tasks assessed/performed       Past Medical History:  Diagnosis Date  . Depression     History reviewed. No pertinent surgical history.  There were no vitals filed for this visit.  Subjective Assessment - 04/25/17 1404    Subjective  I am overall feeling better.  I had 2 bad days and now today is really good.  My Rt shoulder is feeling better.  I can do push-ups on the floor now.      Diagnostic tests  MRI: HNP C2-5    Patient Stated Goals  reduce Rt UE radiculopathy, improve Rt UE strength, avoid surgery    Currently in Pain?  Yes    Pain Score  2     Pain Location  Shoulder    Pain Orientation  Right    Pain Descriptors / Indicators  Aching    Pain Type  Chronic pain    Pain Onset  More than a month ago    Pain Frequency  Constant    Aggravating Factors   lifting, repetative activity, looking down    Pain Relieving Factors  rest, exercis, traction, dry needling                       [facility] Adult PT Treatment/Exercise - 04/25/17 0001      Neck Exercises: Seated   Neck Retraction  10 reps;5 secs    Other Seated Exercise  cervical retraction with extension (only within available ROM)- 10x      Shoulder Exercises: Seated   Other Seated  Exercises  3 way raises: 2# flexion and scaption, 2# abduction 3x5      Shoulder Exercises: Standing   Horizontal ABduction  --    Theraband Level (Shoulder Horizontal ABduction)  --    External Rotation  --    Extension  Strengthening;Both;Weights;20 reps    Extension Weight (lbs)  25    Row  Strengthening;Both;20 reps;Weights    Theraband Level (Shoulder Row)  Other (comment)    Row Weight (lbs)  25#    Other Standing Exercises  snow angels on wall x 6      Shoulder Exercises: ROM/Strengthening   UBE (Upper Arm Bike)  Level 1x 6 minutes (3/3) PT present to discuss progress      Traction   Type of Traction  Cervical    Min (lbs)  5    Max (lbs)  17    Hold Time  60    Rest Time  10    Time  15      Manual Therapy   Manual therapy comments  soft  [facility] [address], [address] [place], [place], [address] Phone: [phone]   Fax:  [phone]  Physical Therapy Treatment  Patient Details  Name: Henry EarthlyBrian L Calico MRN: [id number] Date of Birth: 01/24/1970 Referring Provider: Shirlean KellyNudelman, Robert, MD   Encounter Date: 04/25/2017  PT End of Session - 04/25/17 1435    Visit Number  16    Date for PT Re-Evaluation  05/09/17    Authorization Type  BCBS-30 visit limit    Authorization - Visit Number  16    Authorization - Number of Visits  30    PT Start Time  1400    PT Stop Time  1450    PT Time Calculation (min)  50 min    Activity Tolerance  Patient tolerated treatment well    Behavior During Therapy  [facility] for tasks assessed/performed       Past Medical History:  Diagnosis Date  . Depression     History reviewed. No pertinent surgical history.  There were no vitals filed for this visit.  Subjective Assessment - 04/25/17 1404    Subjective  I am overall feeling better.  I had 2 bad days and now today is really good.  My Rt shoulder is feeling better.  I can do push-ups on the floor now.      Diagnostic tests  MRI: HNP C2-5    Patient Stated Goals  reduce Rt UE radiculopathy, improve Rt UE strength, avoid surgery    Currently in Pain?  Yes    Pain Score  2     Pain Location  Shoulder    Pain Orientation  Right    Pain Descriptors / Indicators  Aching    Pain Type  Chronic pain    Pain Onset  More than a month ago    Pain Frequency  Constant    Aggravating Factors   lifting, repetative activity, looking down    Pain Relieving Factors  rest, exercis, traction, dry needling                       [facility] Adult PT Treatment/Exercise - 04/25/17 0001      Neck Exercises: Seated   Neck Retraction  10 reps;5 secs    Other Seated Exercise  cervical retraction with extension (only within available ROM)- 10x      Shoulder Exercises: Seated   Other Seated  Exercises  3 way raises: 2# flexion and scaption, 2# abduction 3x5      Shoulder Exercises: Standing   Horizontal ABduction  --    Theraband Level (Shoulder Horizontal ABduction)  --    External Rotation  --    Extension  Strengthening;Both;Weights;20 reps    Extension Weight (lbs)  25    Row  Strengthening;Both;20 reps;Weights    Theraband Level (Shoulder Row)  Other (comment)    Row Weight (lbs)  25#    Other Standing Exercises  snow angels on wall x 6      Shoulder Exercises: ROM/Strengthening   UBE (Upper Arm Bike)  Level 1x 6 minutes (3/3) PT present to discuss progress      Traction   Type of Traction  Cervical    Min (lbs)  5    Max (lbs)  17    Hold Time  60    Rest Time  10    Time  15      Manual Therapy   Manual therapy comments  soft

## 2017-05-02 ENCOUNTER — Ambulatory Visit: Payer: BLUE CROSS/BLUE SHIELD | Attending: Neurosurgery

## 2017-05-02 DIAGNOSIS — M542 Cervicalgia: Secondary | ICD-10-CM | POA: Diagnosis not present

## 2017-05-02 DIAGNOSIS — M79601 Pain in right arm: Secondary | ICD-10-CM | POA: Diagnosis not present

## 2017-05-02 DIAGNOSIS — M62838 Other muscle spasm: Secondary | ICD-10-CM | POA: Insufficient documentation

## 2017-05-02 DIAGNOSIS — M6281 Muscle weakness (generalized): Secondary | ICD-10-CM | POA: Insufficient documentation

## 2017-05-02 NOTE — Therapy (Signed)
with MD as needed.     Education / Equipment: HEP, posture/body mechanics Plan: Patient agrees to discharge.  Patient goals were partially met. Patient is being discharged due to being pleased with the current functional level.  ?????     Sigurd Sos, PT 05/02/17 2:50 PM  [facility] [address] [place], [place], [address] Phone: [phone]   Fax:  [phone]  Name: Henry Jennings MRN: [id number] Date of Birth: 08-Jul-1970  Upper Body  Suboccipitals muscle group;Oblique capitus;Upper trapezius    Upper Trapezius Response  Twitch reponse elicited;Palpable increased muscle length    Oblique Capitus Response  Twitch response elicited;Palpable increased muscle length    SubOccipitals Response  Twitch response elicited;Palpable increased muscle length             PT Short Term Goals - 03/14/17 1406      PT SHORT TERM GOAL #2   Title  report a 25% reduction in Rt UE radiculopathy    Baseline  30%     Status  Achieved        PT Long Term Goals - 05/02/17 1407      PT LONG TERM GOAL #1   Title  be independent in advanced HEP    Status  Achieved      PT LONG TERM GOAL #2   Title  report a 75% reduction in Rt UE radiculopathy    Baseline  50% improvement    Status  Partially Met      PT LONG TERM GOAL #3   Title  demonstrate > or = to 65# Rt grip strength to improve lifting and holding heavy items.      Baseline  62#    Status  Partially Met      PT LONG TERM GOAL #4   Title  report < or = to 2/10 cervical pain with turning head or sitting at the computer    Baseline  this varies. Pt makes postural corrections when having pain    Status  Partially Met       PT LONG TERM GOAL #5   Title  reduce FOTO to < or = to 38% limitation    Baseline  34% limitation    Status  Achieved            Plan - 05/02/17 1409    Clinical Impression Statement  Pt has made steady progress and reports 65-70% overall improvement since the start of care.  Pt has a comprehensive HEP in place for strength and A/ROM progression.  Pt with Rt UE strength deficits and radiculopathy that remains.  Pt with reduced trigger points in the neck and upper traps after dry needling.  Pt will D/C to HEP and follow-up with MD as needed.      PT Next Visit Plan  D/C PT to HEP    PT Home Exercise Plan  [id number]  access code    Recommended Other Services  Pt had reduction of Rt UE radiculopathy with supine cervical traction.  Over the door traction is not helping to relieve symptoms.  This patient would be appropriate for [facility] Traction unit to manage radiculopathy    Consulted and Agree with Plan of Care  Patient       Patient will benefit from skilled therapeutic intervention in order to improve the following deficits and impairments:     Visit Diagnosis: Muscle weakness (generalized)  Cervicalgia  Pain in right arm  Other muscle spasm     Problem List There are no active problems to display for this patient.   PHYSICAL THERAPY DISCHARGE SUMMARY  Visits from Start of Care: 17  Current functional level related to goals / functional outcomes: Pt reports 65-70% overall improvement in symptoms since the start of care.  Pt has a HEP in place and will continue to work on flexibility and strength.     Remaining deficits: See above for deficits.  Pt will follow-up  with MD as needed.     Education / Equipment: HEP, posture/body mechanics Plan: Patient agrees to discharge.  Patient goals were partially met. Patient is being discharged due to being pleased with the current functional level.  ?????     Henry Jennings, PT 05/02/17 2:50 PM  [facility] [address], [address] [place], [place], [address] Phone: [phone]   Fax:  [phone]  Name: Henry Jennings MRN: [id number] Date of Birth: August 03, 1970

## 2017-05-11 DIAGNOSIS — F411 Generalized anxiety disorder: Secondary | ICD-10-CM | POA: Diagnosis not present

## 2017-05-11 DIAGNOSIS — J0100 Acute maxillary sinusitis, unspecified: Secondary | ICD-10-CM | POA: Diagnosis not present

## 2017-05-11 DIAGNOSIS — G4700 Insomnia, unspecified: Secondary | ICD-10-CM | POA: Diagnosis not present

## 2017-05-11 DIAGNOSIS — R5382 Chronic fatigue, unspecified: Secondary | ICD-10-CM | POA: Diagnosis not present

## 2017-07-27 DIAGNOSIS — M4722 Other spondylosis with radiculopathy, cervical region: Secondary | ICD-10-CM | POA: Diagnosis not present

## 2017-07-27 DIAGNOSIS — M5020 Other cervical disc displacement, unspecified cervical region: Secondary | ICD-10-CM | POA: Diagnosis not present

## 2017-07-27 DIAGNOSIS — M5412 Radiculopathy, cervical region: Secondary | ICD-10-CM | POA: Diagnosis not present

## 2017-07-27 DIAGNOSIS — M5030 Other cervical disc degeneration, unspecified cervical region: Secondary | ICD-10-CM | POA: Diagnosis not present

## 2017-08-21 DIAGNOSIS — G4700 Insomnia, unspecified: Secondary | ICD-10-CM | POA: Diagnosis not present

## 2017-08-21 DIAGNOSIS — F319 Bipolar disorder, unspecified: Secondary | ICD-10-CM | POA: Diagnosis not present

## 2017-08-21 DIAGNOSIS — R350 Frequency of micturition: Secondary | ICD-10-CM | POA: Diagnosis not present

## 2017-08-21 DIAGNOSIS — F411 Generalized anxiety disorder: Secondary | ICD-10-CM | POA: Diagnosis not present

## 2017-08-21 DIAGNOSIS — R5382 Chronic fatigue, unspecified: Secondary | ICD-10-CM | POA: Diagnosis not present

## 2017-09-24 DIAGNOSIS — M2550 Pain in unspecified joint: Secondary | ICD-10-CM | POA: Diagnosis not present

## 2017-10-15 DIAGNOSIS — M2550 Pain in unspecified joint: Secondary | ICD-10-CM | POA: Diagnosis not present

## 2018-01-02 HISTORY — PX: WISDOM TOOTH EXTRACTION: SHX21

## 2019-01-03 HISTORY — PX: OTHER SURGICAL HISTORY: SHX169

## 2021-02-28 DIAGNOSIS — F4310 Post-traumatic stress disorder, unspecified: Secondary | ICD-10-CM | POA: Diagnosis not present

## 2021-02-28 DIAGNOSIS — F411 Generalized anxiety disorder: Secondary | ICD-10-CM | POA: Diagnosis not present

## 2021-02-28 DIAGNOSIS — F331 Major depressive disorder, recurrent, moderate: Secondary | ICD-10-CM | POA: Diagnosis not present

## 2021-03-07 DIAGNOSIS — F411 Generalized anxiety disorder: Secondary | ICD-10-CM | POA: Diagnosis not present

## 2021-03-07 DIAGNOSIS — F4310 Post-traumatic stress disorder, unspecified: Secondary | ICD-10-CM | POA: Diagnosis not present

## 2021-03-07 DIAGNOSIS — F331 Major depressive disorder, recurrent, moderate: Secondary | ICD-10-CM | POA: Diagnosis not present

## 2021-03-14 DIAGNOSIS — F4310 Post-traumatic stress disorder, unspecified: Secondary | ICD-10-CM | POA: Diagnosis not present

## 2021-03-14 DIAGNOSIS — F331 Major depressive disorder, recurrent, moderate: Secondary | ICD-10-CM | POA: Diagnosis not present

## 2021-03-14 DIAGNOSIS — F411 Generalized anxiety disorder: Secondary | ICD-10-CM | POA: Diagnosis not present

## 2021-03-21 DIAGNOSIS — F331 Major depressive disorder, recurrent, moderate: Secondary | ICD-10-CM | POA: Diagnosis not present

## 2021-03-21 DIAGNOSIS — F411 Generalized anxiety disorder: Secondary | ICD-10-CM | POA: Diagnosis not present

## 2021-03-21 DIAGNOSIS — F4310 Post-traumatic stress disorder, unspecified: Secondary | ICD-10-CM | POA: Diagnosis not present

## 2021-03-25 ENCOUNTER — Ambulatory Visit (INDEPENDENT_AMBULATORY_CARE_PROVIDER_SITE_OTHER): Payer: BC Managed Care – PPO

## 2021-03-25 ENCOUNTER — Encounter: Payer: Self-pay | Admitting: Specialist

## 2021-03-25 ENCOUNTER — Other Ambulatory Visit: Payer: Self-pay

## 2021-03-25 ENCOUNTER — Ambulatory Visit (INDEPENDENT_AMBULATORY_CARE_PROVIDER_SITE_OTHER): Payer: BC Managed Care – PPO | Admitting: Specialist

## 2021-03-25 VITALS — BP 128/83 | HR 112 | Temp 98.1°F | Ht 65.5 in | Wt 132.2 lb

## 2021-03-25 DIAGNOSIS — M542 Cervicalgia: Secondary | ICD-10-CM

## 2021-03-25 DIAGNOSIS — M7541 Impingement syndrome of right shoulder: Secondary | ICD-10-CM

## 2021-03-25 DIAGNOSIS — M47812 Spondylosis without myelopathy or radiculopathy, cervical region: Secondary | ICD-10-CM

## 2021-03-25 DIAGNOSIS — G5620 Lesion of ulnar nerve, unspecified upper limb: Secondary | ICD-10-CM

## 2021-03-25 DIAGNOSIS — M5030 Other cervical disc degeneration, unspecified cervical region: Secondary | ICD-10-CM | POA: Diagnosis not present

## 2021-03-25 DIAGNOSIS — R258 Other abnormal involuntary movements: Secondary | ICD-10-CM

## 2021-03-25 DIAGNOSIS — R200 Anesthesia of skin: Secondary | ICD-10-CM

## 2021-03-25 DIAGNOSIS — R202 Paresthesia of skin: Secondary | ICD-10-CM

## 2021-03-25 DIAGNOSIS — R292 Abnormal reflex: Secondary | ICD-10-CM

## 2021-03-25 DIAGNOSIS — M7542 Impingement syndrome of left shoulder: Secondary | ICD-10-CM

## 2021-03-25 MED ORDER — METHYLPREDNISOLONE 4 MG PO TBPK: ORAL_TABLET | ORAL | 0 refills | Status: DC

## 2021-03-25 MED ORDER — GABAPENTIN 100 MG PO CAPS: 100.0000 mg | ORAL_CAPSULE | Freq: Every day | ORAL | 1 refills | Status: DC

## 2021-03-25 NOTE — Patient Instructions (Signed)
Avoid overhead lifting and overhead use of the arms. ?Pillows to keep from sleeping directly on the shoulders ?Limited lifting to less than 10 lbs. ?Ice or heat for relief. ?NSAIDs are helpful, such as alleve or motrin, be careful not to use in excess as they place burdens on the kidney. ?Stretching exercise help and strengthening is helpful to build endurance.  ?Physical therapy ?Medrol dose pak, stop diclofenac with the steriod wait a day the restart diclofenac with meal or snack ?Gabapentin 100mg  at night ?Referral to neurology. ?
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
## 2021-03-31 ENCOUNTER — Encounter: Payer: Self-pay | Admitting: Rehabilitative and Restorative Service Providers"

## 2021-03-31 ENCOUNTER — Ambulatory Visit: Payer: BC Managed Care – PPO | Attending: Specialist | Admitting: Rehabilitative and Restorative Service Providers"

## 2021-03-31 DIAGNOSIS — M47812 Spondylosis without myelopathy or radiculopathy, cervical region: Secondary | ICD-10-CM | POA: Diagnosis not present

## 2021-03-31 DIAGNOSIS — F902 Attention-deficit hyperactivity disorder, combined type: Secondary | ICD-10-CM | POA: Diagnosis not present

## 2021-03-31 DIAGNOSIS — R252 Cramp and spasm: Secondary | ICD-10-CM | POA: Diagnosis not present

## 2021-03-31 DIAGNOSIS — M7542 Impingement syndrome of left shoulder: Secondary | ICD-10-CM | POA: Diagnosis not present

## 2021-03-31 DIAGNOSIS — M542 Cervicalgia: Secondary | ICD-10-CM | POA: Insufficient documentation

## 2021-03-31 DIAGNOSIS — M7541 Impingement syndrome of right shoulder: Secondary | ICD-10-CM | POA: Insufficient documentation

## 2021-03-31 DIAGNOSIS — F332 Major depressive disorder, recurrent severe without psychotic features: Secondary | ICD-10-CM | POA: Diagnosis not present

## 2021-03-31 DIAGNOSIS — M6281 Muscle weakness (generalized): Secondary | ICD-10-CM | POA: Insufficient documentation

## 2021-03-31 DIAGNOSIS — F4312 Post-traumatic stress disorder, chronic: Secondary | ICD-10-CM | POA: Diagnosis not present

## 2021-03-31 NOTE — Therapy (Signed)
?OUTPATIENT PHYSICAL THERAPY CERVICAL EVALUATION ? ? ?Patient Name: Henry Jennings ?MRN: [id number] ?DOB:[birth year], 51 y.o., male ?Today's Date: 03/31/2021 ? ? PT End of Session - 03/31/21 1234   ? ? Visit Number 1   ? Date for PT Re-Evaluation 05/27/21   ? Authorization Type BC/BS   ? PT Start Time 1230   ? PT Stop Time 1310   ? PT Time Calculation (min) 40 min   ? Activity Tolerance Patient tolerated treatment well   ? Behavior During Therapy [facility] for tasks assessed/performed   ? ?  ?  ? ?  ? ? ?Past Medical History:  ?Diagnosis Date  ? Depression   ? ?History reviewed. No pertinent surgical history. ?There are no problems to display for this patient. ? ? ?PCP: No primary care provider on file. ? ?REFERRING PROVIDER: Kerrin Champagne, MD ? ?REFERRING DIAG: M54.2 (ICD-10-CM) - Cervicalgia M47.812 (ICD-10-CM) - Spondylosis without myelopathy or radiculopathy, cervical region M75.41 (ICD-10-CM) - Impingement syndrome of right shoulder M75.42 (ICD-10-CM) - Impingement syndrome of left shoulder  ? ?THERAPY DIAG:  ?Cervicalgia - Plan: PT plan of care cert/re-cert ? ?Cramp and spasm - Plan: PT plan of care cert/re-cert ? ?Muscle weakness (generalized) - Plan: PT plan of care cert/re-cert ? ?ONSET DATE: 03/25/2021  ? ?SUBJECTIVE:                                                                                                                                                                                                        ? ?SUBJECTIVE STATEMENT: ?Pt reports that he has had a history of cervical pain before and had successful PT previously.  Pt states that he has had some pain since fall and it has progressively gotten worse. Pt reports that he was recently prescribed a steroid dose pack, and it helped, but since the medication has ended, he has started having pain again.  Pt reports some relief of pain with CBD oil, as well. ? ?PERTINENT HISTORY:  ?Fibromialgia ? ?PAIN:  ?Are you having pain? Yes: NPRS scale: 8/10 ?Pain  location: cervical into bilat shoulders ?Pain description: radiating down arms ?Aggravating factors: worse in the AM and with activity ?Relieving factors: CBD oil ? ?PRECAUTIONS: None ? ?WEIGHT BEARING RESTRICTIONS No ? ?FALLS:  ?Has patient fallen in last 6 months? No ? ?LIVING ENVIRONMENT: ?Lives with: lives with their spouse ?Lives in: House/apartment ?Stairs: Yes: External: 3 on one side, 10 on the other steps; on right going up ?Has following equipment at home: None ? ?OCCUPATION: sells collectibles online ? ?PLOF: Independent and  Leisure: going to concerts, gardening, hiking ? ?PATIENT GOALS:  Pt would like to get back to being able to function normally again without increased pain. ? ?OBJECTIVE:  ? ?DIAGNOSTIC FINDINGS:  ?03/25/2021 Cervical Radiograph:  AP and lateral radiographs in flexion and extension of the cervical spine demonstrate disc narrowing C4-5  ?C5-6, C6-7 and C7-T1. Spondylosis changes bilateral uncovertebral joints  ?C5-6, C6-7and C7-T1. Loss of normal Cervical lordotic curve due to anterior column shortening.  ? ?PATIENT SURVEYS:  ?FOTO 38% at initial evaluation (projected of 55% by visit 13) ? ? ?COGNITION: ?Overall cognitive status: Within functional limits for tasks assessed ? ? ?SENSATION: ?Light touch: Impaired Pt reports impaired sensation on the right side ? ?POSTURE:  ?Forward head, rounded shoulders ? ?PALPATION: ?Tender to palpation along cervical paraspinals.  Muscle spasms noted upper trap and cervical multifidi.  ? ?CERVICAL ROM:  ? ?Active ROM A/PROM (deg) ?03/31/2021  ?Flexion 25  ?Extension 5  ?Right lateral flexion 25  ?Left lateral flexion 5  ?Right rotation 28  ?Left rotation 22  ? (Blank rows = not tested) ? ?UE ROM: ? ?Active ROM Right ?03/31/2021 Left ?03/31/2021  ?Shoulder flexion 99 155  ?Shoulder extension    ?Shoulder abduction 112 145  ?Shoulder adduction    ?Shoulder extension    ?Shoulder internal rotation    ?Shoulder external rotation    ?Elbow flexion    ?Elbow  extension    ?Wrist flexion    ?Wrist extension    ?Wrist ulnar deviation    ?Wrist radial deviation    ?Wrist pronation    ?Wrist supination    ? (Blank rows = not tested) ? ?UE MMT: ? ?MMT Right ?03/31/2021 Left ?03/31/2021  ?Shoulder flexion 3 4  ?Shoulder extension    ?Shoulder abduction 3 4  ?Shoulder adduction    ?Shoulder extension    ?Shoulder internal rotation    ?Shoulder external rotation    ?Middle trapezius    ?Lower trapezius    ?Elbow flexion    ?Elbow extension    ?Wrist flexion    ?Wrist extension    ?Wrist ulnar deviation    ?Wrist radial deviation    ?Wrist pronation    ?Wrist supination    ?Grip strength 45 60  ? (Blank rows = not tested) ? ?FUNCTIONAL TESTS:  ?5 times sit to stand: 22 sec without UE.  However, pt states that he is going slow secondary dizziness ? ? ?TODAY'S TREATMENT:  ? ?03/31/2021: reviewed HEP issued.  Discussed dry needling. ? ? ?PATIENT EDUCATION:  ?Education details: Issued HEP ?Person educated: Patient ?Education method: Explanation, Demonstration, and Handouts ?Education comprehension: verbalized understanding and returned demonstration ? ? ?HOME EXERCISE PROGRAM: ?Access Code: [email or web address] ?URL: [email or web address] ?Date: 03/31/2021 ?Prepared by: Reather Laurence ? ?Exercises ?- Seated Upper Trapezius Stretch  - 1 x daily - 7 x weekly - 1 sets - 2 reps - 20 sec hold ?- Seated Cervical Retraction  - 2 x daily - 7 x weekly - 2 sets - 10 reps ?- Seated Scapular Retraction  - 1 x daily - 7 x weekly - 2 sets - 10 reps ?- Seated Shoulder Flexion Towel Slide at Table Top  - 1 x daily - 7 x weekly - 2 sets - 10 reps ?- Supine Chin Tuck  - 1 x daily - 7 x weekly - 2 sets - 10 reps ? ?ASSESSMENT: ? ?CLINICAL IMPRESSION: ?Patient is a 51 y.o. male who was seen today for physical therapy evaluation and treatment  for cervicalgia and shoulder pain. Pts PLOF is able to go hiking without pain.  Pt had a great experience with dry needling last session and wants to have service  again.  Pt presents with weakness, cervical and shoulder pain, and difficulty performing functional tasks in his home with decreased R cervical and shoulder A/ROM. Pt with some dizziness noted during 5 times sit to/from stand assessment and reports that he has dizziness throughout the day and is getting a referral to a neurologist. Pt would benefit from skilled PT to address his functional impairments to allow him to return to being able to perform activities without increased pain. ? ? ?OBJECTIVE IMPAIRMENTS decreased ROM, decreased strength, dizziness, increased muscle spasms, impaired UE functional use, and pain.  ? ?ACTIVITY LIMITATIONS cleaning and community activity.  ? ?PERSONAL FACTORS 1 comorbidity: Hx of cervicalgia with radiculopathy  are also affecting patient's functional outcome.  ? ? ?REHAB POTENTIAL: Good ? ?CLINICAL DECISION MAKING: Evolving/moderate complexity ? ?EVALUATION COMPLEXITY: Moderate ? ? ?GOALS: ?Goals reviewed with patient? Yes ? ?SHORT TERM GOALS: Target date: 04/21/2021 ? ?Pt will be independent with initial HEP. ?Baseline:  ?Goal status: INITIAL ? ?2.  Pt will report a 40% improvement in symptoms since initial evaluation. ?Baseline:  ?Goal status: INITIAL ? ? ?LONG TERM GOALS: Target date: 05/26/2021 ? ?Pt will be independent with advanced HEP. ?Baseline:  ?Goal status: INITIAL ? ?2.  Pt will have FOTO of at least 55% to demonstrate increased functional mobility. ?Baseline: 38% ?Goal status: INITIAL ? ?3.  Pt will increase cervical A/ROM by at least 10 degrees to allow him to observe his surroundings with hiking. ?Baseline: see chart ?Goal status: INITIAL ? ?4.  Pt will increase R shoulder flexion and abduction A/ROM to at least 120 degrees to allow him to reach into overhead cabinets. ?Baseline: R shoulder flexion 99 degrees, R shoulder abduction 112 degrees. ?Goal status: INITIAL ? ?5.  Pt will increase R shoulder strength to at least 4+/5 to allow him to perform activities around  home. ?Baseline:  ?Goal status: INITIAL ? ? ?PLAN: ?PT FREQUENCY: 2x/week ? ?PT DURATION: 8 weeks ? ?PLANNED INTERVENTIONS: Therapeutic exercises, Therapeutic activity, Neuromuscular re-education, Balance tra

## 2021-04-01 ENCOUNTER — Ambulatory Visit: Payer: BC Managed Care – PPO | Admitting: Rehabilitative and Restorative Service Providers"

## 2021-04-01 ENCOUNTER — Encounter: Payer: Self-pay | Admitting: Rehabilitative and Restorative Service Providers"

## 2021-04-01 DIAGNOSIS — M7541 Impingement syndrome of right shoulder: Secondary | ICD-10-CM | POA: Diagnosis not present

## 2021-04-01 DIAGNOSIS — M6281 Muscle weakness (generalized): Secondary | ICD-10-CM

## 2021-04-01 DIAGNOSIS — R252 Cramp and spasm: Secondary | ICD-10-CM

## 2021-04-01 DIAGNOSIS — M542 Cervicalgia: Secondary | ICD-10-CM

## 2021-04-01 DIAGNOSIS — M7542 Impingement syndrome of left shoulder: Secondary | ICD-10-CM | POA: Diagnosis not present

## 2021-04-01 DIAGNOSIS — M47812 Spondylosis without myelopathy or radiculopathy, cervical region: Secondary | ICD-10-CM | POA: Diagnosis not present

## 2021-04-01 NOTE — Patient Instructions (Signed)

## 2021-04-01 NOTE — Therapy (Signed)
?OUTPATIENT PHYSICAL THERAPY TREATMENT NOTE ? ? ?Patient Name: Henry Jennings ?MRN: [id number] ?DOB:05/24/1970, 51 y.o., male ?Today's Date: 04/01/2021 ? ?PCP: No primary care provider on file. ?REFERRING PROVIDER: Kerrin Champagne, MD ? ? PT End of Session - 04/01/21 0937   ? ? Visit Number 2   ? Date for PT Re-Evaluation 05/27/21   ? Authorization Type BC/BS   ? PT Start Time [phone]   ? PT Stop Time 1015   ? PT Time Calculation (min) 41 min   ? Activity Tolerance Patient tolerated treatment well   ? Behavior During Therapy [facility] for tasks assessed/performed   ? ?  ?  ? ?  ? ? ?Past Medical History:  ?Diagnosis Date  ? Depression   ? ?History reviewed. No pertinent surgical history. ?There are no problems to display for this patient. ? ? ?REFERRING DIAG: M54.2 (ICD-10-CM) - Cervicalgia M47.812 (ICD-10-CM) - Spondylosis without myelopathy or radiculopathy, cervical region M75.41 (ICD-10-CM) - Impingement syndrome of right shoulder M75.42 (ICD-10-CM) - Impingement syndrome of left shoulder  ? ?THERAPY DIAG:  ?Cervicalgia ? ?Cramp and spasm ? ?Muscle weakness (generalized) ? ?PERTINENT HISTORY: Fibromyalgia ? ?PRECAUTIONS: None ? ?SUBJECTIVE: Pt reports feeling better this morning, states that he took his CBD this morning. ? ?PAIN:  ?Are you having pain? Yes: NPRS scale: 6/10 ?Pain location: cervical and shoulders ?Pain description: aching ?Aggravating factors: worse in the AM and with activity ?Relieving factors: CBD oil ? ?PATIENT GOALS:  Pt would like to get back to being able to function normally again without increased pain. ? ?OBJECTIVE:  ?  ?DIAGNOSTIC FINDINGS:  ?03/25/2021 Cervical Radiograph:  AP and lateral radiographs in flexion and extension of the cervical spine demonstrate disc narrowing C4-5  ?C5-6, C6-7 and C7-T1. Spondylosis changes bilateral uncovertebral joints  ?C5-6, C6-7and C7-T1. Loss of normal Cervical lordotic curve due to anterior column shortening.  ?  ?PATIENT SURVEYS:  ?FOTO 38% at initial  evaluation (projected of 55% by visit 13) ?  ?  ?COGNITION: ?Overall cognitive status: Within functional limits for tasks assessed ?  ?  ?SENSATION: ?Light touch: Impaired Pt reports impaired sensation on the right side ?  ?POSTURE:  ?Forward head, rounded shoulders ?  ?PALPATION: ?Tender to palpation along cervical paraspinals.  Muscle spasms noted upper trap and cervical multifidi.    ?  ?CERVICAL ROM:  ?  ?Active ROM A/PROM (deg) ?03/31/2021  ?Flexion 25  ?Extension 5  ?Right lateral flexion 25  ?Left lateral flexion 5  ?Right rotation 28  ?Left rotation 22  ? (Blank rows = not tested) ?  ?UE ROM: ?  ?Active ROM Right ?03/31/2021 Left ?03/31/2021  ?Shoulder flexion 99 155  ?Shoulder extension      ?Shoulder abduction 112 145  ?Shoulder adduction      ?Shoulder extension      ?Shoulder internal rotation      ?Shoulder external rotation      ?Elbow flexion      ?Elbow extension      ?Wrist flexion      ?Wrist extension      ?Wrist ulnar deviation      ?Wrist radial deviation      ?Wrist pronation      ?Wrist supination      ? (Blank rows = not tested) ?  ?UE MMT: ?  ?MMT Right ?03/31/2021 Left ?03/31/2021  ?Shoulder flexion 3 4  ?Shoulder extension      ?Shoulder abduction 3 4  ?Shoulder adduction      ?  Shoulder extension      ?Shoulder internal rotation      ?Shoulder external rotation      ?Middle trapezius      ?Lower trapezius      ?Elbow flexion      ?Elbow extension      ?Wrist flexion      ?Wrist extension      ?Wrist ulnar deviation      ?Wrist radial deviation      ?Wrist pronation      ?Wrist supination      ?Grip strength 45 60  ? (Blank rows = not tested) ?  ?FUNCTIONAL TESTS:  ?5 times sit to stand: 22 sec without UE.  However, pt states that he is going slow secondary dizziness ?  ?  ?TODAY'S TREATMENT:  ?  ?04/01/2021: ?Seated:  chin tucks, shoulder rolls, scapular retraction.  BUE 2x10 each ?Seated with 1# dumbbells:  shoulder flexion, shoulder abduction.  BUE 2x10 each ?Seated with yellow tband:   shoulder ER, horizontal shoulder abduction.  BUE 2x10 each ?DRY NEEDLING: ?Dry needling consent given? yes ?Educational handouts provided? yes ?Muscles treated: bilat suboccipitals, cervical multifidi, levator, upper traps ?Response from dry needling: twitch response and muscle elongation noted ?Manual therapy for soft tissue mobilization following dry needling for further muscle elongation.  ? ?03/31/2021: reviewed HEP issued.  Discussed dry needling. ?  ?  ?PATIENT EDUCATION:  ?Education details: Issued HEP ?Person educated: Patient ?Education method: Explanation, Demonstration, and Handouts ?Education comprehension: verbalized understanding and returned demonstration ?  ?  ?HOME EXERCISE PROGRAM: ?Access Code: [email or web address] ?URL: [email or web address] ?Date: 03/31/2021 ?Prepared by: Henry Jennings ?  ?Exercises ?- Seated Upper Trapezius Stretch  - 1 x daily - 7 x weekly - 1 sets - 2 reps - 20 sec hold ?- Seated Cervical Retraction  - 2 x daily - 7 x weekly - 2 sets - 10 reps ?- Seated Scapular Retraction  - 1 x daily - 7 x weekly - 2 sets - 10 reps ?- Seated Shoulder Flexion Towel Slide at Table Top  - 1 x daily - 7 x weekly - 2 sets - 10 reps ?- Supine Chin Tuck  - 1 x daily - 7 x weekly - 2 sets - 10 reps ?  ?ASSESSMENT: ?  ?CLINICAL IMPRESSION: ?Henry Jennings presents to session ready to work with PT and agreeable to dry needling (DN).  Pt with some soreness noted with therex, but ended session with manual and DN.  Pt reports feeling that he could take a deeper breath following DN.  Additionally, pt with decreased pain with A/ROM of cervical region following DN.  Pt continues to require skilled PT to progress towards goal related activities. ?  ?  ?OBJECTIVE IMPAIRMENTS decreased ROM, decreased strength, dizziness, increased muscle spasms, impaired UE functional use, and pain.  ?  ?ACTIVITY LIMITATIONS cleaning and community activity.  ?  ?PERSONAL FACTORS 1 comorbidity: Hx of cervicalgia with radiculopathy   are also affecting patient's functional outcome.  ?  ?  ?REHAB POTENTIAL: Good ?  ?CLINICAL DECISION MAKING: Evolving/moderate complexity ?  ?EVALUATION COMPLEXITY: Moderate ?  ?  ?GOALS: ?Goals reviewed with patient? Yes ?  ?SHORT TERM GOALS: Target date: 04/21/2021 ?  ?Pt will be independent with initial HEP. ?Baseline:  ?Goal status: INITIAL ?  ?2.  Pt will report a 40% improvement in symptoms since initial evaluation. ?Baseline:  ?Goal status: INITIAL ?  ?  ?LONG TERM GOALS: Target date: 05/26/2021 ?  ?Pt will be  independent with advanced HEP. ?Baseline:  ?Goal status: INITIAL ?  ?2.  Pt will have FOTO of at least 55% to demonstrate increased functional mobility. ?Baseline: 38% ?Goal status: INITIAL ?  ?3.  Pt will increase cervical A/ROM by at least 10 degrees to allow him to observe his surroundings with hiking. ?Baseline: see chart ?Goal status: INITIAL ?  ?4.  Pt will increase R shoulder flexion and abduction A/ROM to at least 120 degrees to allow him to reach into overhead cabinets. ?Baseline: R shoulder flexion 99 degrees, R shoulder abduction 112 degrees. ?Goal status: INITIAL ?  ?5.  Pt will increase R shoulder strength to at least 4+/5 to allow him to perform activities around home. ?Baseline:  ?Goal status: INITIAL ?  ?  ?PLAN: ?PT FREQUENCY: 2x/week ?  ?PT DURATION: 8 weeks ?  ?PLANNED INTERVENTIONS: Therapeutic exercises, Therapeutic activity, Neuromuscular re-education, Balance training, Gait training, Patient/Family education, Joint manipulation, Joint mobilization, Vestibular training, Canalith repositioning, Aquatic Therapy, Dry Needling, Electrical stimulation, Cryotherapy, Moist heat, Traction, Ultrasound, Ionotophoresis 4mg /ml Dexamethasone, and Manual therapy ?  ?PLAN FOR NEXT SESSION: assess and progress HEP, strengthening, ROM, dry needling and/or manual therapy as indicated ? ? ? ? ?Henry Jennings, PT ?04/01/2021, 10:31 AM ? ?[facility] ?[address], [address] ?[place], [place] [address] ?Phone # [phone] ?Fax [phone] ? ?

## 2021-04-04 DIAGNOSIS — F331 Major depressive disorder, recurrent, moderate: Secondary | ICD-10-CM | POA: Diagnosis not present

## 2021-04-04 DIAGNOSIS — F4310 Post-traumatic stress disorder, unspecified: Secondary | ICD-10-CM | POA: Diagnosis not present

## 2021-04-04 DIAGNOSIS — F411 Generalized anxiety disorder: Secondary | ICD-10-CM | POA: Diagnosis not present

## 2021-04-04 NOTE — Therapy (Signed)
?OUTPATIENT PHYSICAL THERAPY TREATMENT NOTE ? ? ?Patient Name: Henry Jennings ?MRN: [id number] ?DOB:[birth year], 51 y.o., male ?Today's Date: 04/05/2021 ? ?PCP: No primary care provider on file. ?REFERRING PROVIDER: Kerrin Champagne, MD ? ? PT End of Session - 04/05/21 1233   ? ? Visit Number 3   ? Date for PT Re-Evaluation 05/27/21   ? Authorization Type BC/BS   ? PT Start Time 1152   ? PT Stop Time 1230   ? PT Time Calculation (min) 38 min   ? Activity Tolerance Patient tolerated treatment well   ? Behavior During Therapy [facility] for tasks assessed/performed   ? ?  ?  ? ?  ? ? ?Past Medical History:  ?Diagnosis Date  ? Depression   ? ?History reviewed. No pertinent surgical history. ?There are no problems to display for this patient. ? ? ?REFERRING DIAG: M54.2 (ICD-10-CM) - Cervicalgia M47.812 (ICD-10-CM) - Spondylosis without myelopathy or radiculopathy, cervical region M75.41 (ICD-10-CM) - Impingement syndrome of right shoulder M75.42 (ICD-10-CM) - Impingement syndrome of left shoulder   ? ?THERAPY DIAG:  ?Cervicalgia ? ?Cramp and spasm ? ?Muscle weakness (generalized) ? ?PERTINENT HISTORY: Fibromyalgia, ongoing dizziness going to be worked up at neurologist ? ?PRECAUTIONS: n/a ? ?ONSET DATE: 03/25/2021   ? ?SUBJECTIVE: I am better than last week.  CBD oil helps.  The neck retraction hurts on the 2nd set of 10.   ? ?PAIN:  ?Are you having pain? Yes: NPRS scale: 6/10 ?Pain location: cervical into bilat shoulders ?Pain description: radiating down arms ?Aggravating factors: worse in the AM and with activity ?Relieving factors: CBD oil ? ?OBJECTIVE:  ?  ?DIAGNOSTIC FINDINGS:  ?03/25/2021 Cervical Radiograph:  AP and lateral radiographs in flexion and extension of the cervical spine demonstrate disc narrowing C4-5  ?C5-6, C6-7 and C7-T1. Spondylosis changes bilateral uncovertebral joints  ?C5-6, C6-7and C7-T1. Loss of normal Cervical lordotic curve due to anterior column shortening.  ?  ?PATIENT SURVEYS:  ?FOTO 38% at initial  evaluation (projected of 55% by visit 13)  ?  ?SENSATION: ?03/31/21: Light touch: Impaired Pt reports impaired sensation on the right side ?  ?POSTURE:  ?03/31/21: ?Forward head, rounded shoulders ?  ?PALPATION: ?03/31/21: ?Tender to palpation along cervical paraspinals.  Muscle spasms noted upper trap and cervical multifidi.    ?  ?CERVICAL ROM:  ?  ?Active ROM A/PROM (deg) ?03/31/2021  ?Flexion 25  ?Extension 5  ?Right lateral flexion 25  ?Left lateral flexion 5  ?Right rotation 28  ?Left rotation 22  ? (Blank rows = not tested) ?  ?UE ROM: ?  ?Active ROM Right ?03/31/2021 Left ?03/31/2021  ?Shoulder flexion 99 155  ?Shoulder extension      ?Shoulder abduction 112 145  ?Shoulder adduction      ?Shoulder extension      ?Shoulder internal rotation      ?Shoulder external rotation      ?Elbow flexion      ?Elbow extension      ?Wrist flexion      ?Wrist extension      ?Wrist ulnar deviation      ?Wrist radial deviation      ?Wrist pronation      ?Wrist supination      ? (Blank rows = not tested) ?  ?UE MMT: ?  ?MMT Right ?03/31/2021 Left ?03/31/2021  ?Shoulder flexion 3 4  ?Shoulder extension      ?Shoulder abduction 3 4  ?Shoulder adduction      ?Shoulder extension      ?  Shoulder internal rotation      ?Shoulder external rotation      ?Middle trapezius      ?Lower trapezius      ?Elbow flexion      ?Elbow extension      ?Wrist flexion      ?Wrist extension      ?Wrist ulnar deviation      ?Wrist radial deviation      ?Wrist pronation      ?Wrist supination      ?Grip strength 45 60  ? (Blank rows = not tested) ?  ?FUNCTIONAL TESTS:  ?03/31/21: 5 times sit to stand: 22 sec without UE.  However, pt states that he is going slow secondary dizziness ?  ?  ?TODAY'S TREATMENT:   ?04/05/21: ?Trigger Point Dry-Needling  ?Treatment instructions: Expect mild to moderate muscle soreness. S/S of pneumothorax if dry needled over a lung field, and to seek immediate medical attention should they occur. Patient verbalized understanding of  these instructions and education. ? ?Patient Consent Given: Yes ?Education handout provided: Previously provided ?Muscles treated: bil upper traps, bil SO, bil cervical paraspinals, Rt infraspinatus, Rt posterior deltoid ?Electrical stimulation performed: No ?Parameters: N/A ?Treatment response/outcome: twitch/elongation, improved cervical SB and Rot with less pain, improved ROM end of session ? ?STM to all muscles s/p DN ?A/ROM cervical SB 3x10 sec ?A/ROM bil cervical Rot painfree range x 3 cycles ? ? ?03/31/2021: reviewed HEP issued.  Discussed dry needling. ?  ?  ?PATIENT EDUCATION:  ?Education details: Issued HEP ?Person educated: Patient ?Education method: Explanation, Demonstration, and Handouts ?Education comprehension: verbalized understanding and returned demonstration ?  ?  ?HOME EXERCISE PROGRAM: ?Access Code: [email or web address] ?URL: [email or web address] ?Date: 03/31/2021 ?Prepared by: Reather Laurence ?  ?Exercises ?- Seated Upper Trapezius Stretch  - 1 x daily - 7 x weekly - 1 sets - 2 reps - 20 sec hold ?- Seated Cervical Retraction  - 2 x daily - 7 x weekly - 2 sets - 10 reps ?- Seated Scapular Retraction  - 1 x daily - 7 x weekly - 2 sets - 10 reps ?- Seated Shoulder Flexion Towel Slide at Table Top  - 1 x daily - 7 x weekly - 2 sets - 10 reps ?- Supine Chin Tuck  - 1 x daily - 7 x weekly - 2 sets - 10 reps ?  ?ASSESSMENT: ?  ?CLINICAL IMPRESSION: ?Session focused on DN to posterior cervical, bil upper traps and posterior Rt shoulder.  Pt with good tolerance and demo'd improved quality, range and pain with bil cervical SB and Rot end of session.  Progress therex next session. ?  ?  ?OBJECTIVE IMPAIRMENTS decreased ROM, decreased strength, dizziness, increased muscle spasms, impaired UE functional use, and pain.  ?  ?ACTIVITY LIMITATIONS cleaning and community activity.  ?  ?PERSONAL FACTORS 1 comorbidity: Hx of cervicalgia with radiculopathy  are also affecting patient's functional outcome.  ?  ?   ?REHAB POTENTIAL: Good ?  ?CLINICAL DECISION MAKING: Evolving/moderate complexity ?  ?EVALUATION COMPLEXITY: Moderate ?  ?  ?GOALS: ?Goals reviewed with patient? Yes ?  ?SHORT TERM GOALS: Target date: 04/21/2021 ?  ?Pt will be independent with initial HEP. ?Baseline:  ?Goal status: INITIAL ?  ?2.  Pt will report a 40% improvement in symptoms since initial evaluation. ?Baseline:  ?Goal status: INITIAL ?  ?  ?LONG TERM GOALS: Target date: 05/26/2021 ?  ?Pt will be independent with advanced HEP. ?Baseline:  ?Goal status: INITIAL ?  ?2.  Pt will have FOTO of at least 55% to demonstrate increased functional mobility. ?Baseline: 38% ?Goal status: INITIAL ?  ?3.  Pt will increase cervical A/ROM by at least 10 degrees to allow him to observe his surroundings with hiking. ?Baseline: see chart ?Goal status: INITIAL ?  ?4.  Pt will increase R shoulder flexion and abduction A/ROM to at least 120 degrees to allow him to reach into overhead cabinets. ?Baseline: R shoulder flexion 99 degrees, R shoulder abduction 112 degrees. ?Goal status: INITIAL ?  ?5.  Pt will increase R shoulder strength to at least 4+/5 to allow him to perform activities around home. ?Baseline:  ?Goal status: INITIAL ?  ?  ?PLAN: ?PT FREQUENCY: 2x/week ?  ?PT DURATION: 8 weeks ?  ?PLANNED INTERVENTIONS: Therapeutic exercises, Therapeutic activity, Neuromuscular re-education, Balance training, Gait training, Patient/Family education, Joint manipulation, Joint mobilization, Vestibular training, Canalith repositioning, Aquatic Therapy, Dry Needling, Electrical stimulation, Cryotherapy, Moist heat, Traction, Ultrasound, Ionotophoresis 4mg /ml Dexamethasone, and Manual therapy ?  ?PLAN FOR NEXT SESSION: assess and progress HEP, strengthening, ROM, dry needling and/or manual therapy as indicated ? ? ?Doyce Para Amauria Younts, PT ?04/05/2021, 12:34 PM ? ?  ? ?

## 2021-04-05 ENCOUNTER — Encounter: Payer: Self-pay | Admitting: Physical Therapy

## 2021-04-05 ENCOUNTER — Ambulatory Visit: Payer: BC Managed Care – PPO | Attending: Specialist | Admitting: Physical Therapy

## 2021-04-05 DIAGNOSIS — R252 Cramp and spasm: Secondary | ICD-10-CM | POA: Insufficient documentation

## 2021-04-05 DIAGNOSIS — M6281 Muscle weakness (generalized): Secondary | ICD-10-CM | POA: Diagnosis not present

## 2021-04-05 DIAGNOSIS — M542 Cervicalgia: Secondary | ICD-10-CM | POA: Insufficient documentation

## 2021-04-07 ENCOUNTER — Telehealth: Payer: Self-pay

## 2021-04-07 NOTE — Telephone Encounter (Signed)
Call placed to only number listed, patients mother's home.  Mother answered.  Does not know why patient missed appt.  Explained that were just calling to check on him and to have him call office before next appt if he thinks he will not be able to make his appt. This will avoid [place] having to cancel remaining appts due to our no-show policy.   ?

## 2021-04-14 ENCOUNTER — Ambulatory Visit: Payer: BC Managed Care – PPO

## 2021-04-14 DIAGNOSIS — F902 Attention-deficit hyperactivity disorder, combined type: Secondary | ICD-10-CM | POA: Diagnosis not present

## 2021-04-14 DIAGNOSIS — F4312 Post-traumatic stress disorder, chronic: Secondary | ICD-10-CM | POA: Diagnosis not present

## 2021-04-14 DIAGNOSIS — R252 Cramp and spasm: Secondary | ICD-10-CM

## 2021-04-14 DIAGNOSIS — F332 Major depressive disorder, recurrent severe without psychotic features: Secondary | ICD-10-CM | POA: Diagnosis not present

## 2021-04-14 DIAGNOSIS — M542 Cervicalgia: Secondary | ICD-10-CM | POA: Diagnosis not present

## 2021-04-14 DIAGNOSIS — M6281 Muscle weakness (generalized): Secondary | ICD-10-CM

## 2021-04-14 NOTE — Therapy (Signed)
?OUTPATIENT PHYSICAL THERAPY TREATMENT NOTE ? ? ?Patient Name: Henry Jennings ?MRN: [id number] ?DOB:08-Feb-1970, 51 y.o., male ?Today's Date: 04/14/2021 ? ?PCP: No primary care provider on file. ?REFERRING PROVIDER: Kerrin Champagne, MD ? ? PT End of Session - 04/14/21 1233   ? ? Visit Number 4   ? Date for PT Re-Evaluation 05/27/21   ? Authorization Type BC/BS   ? PT Start Time 1234   ? PT Stop Time 1305   ? PT Time Calculation (min) 31 min   ? Activity Tolerance Patient tolerated treatment well   ? Behavior During Therapy [facility] for tasks assessed/performed   ? ?  ?  ? ?  ? ? ?Past Medical History:  ?Diagnosis Date  ? Depression   ? ?History reviewed. No pertinent surgical history. ?There are no problems to display for this patient. ? ? ?REFERRING DIAG: M54.2 (ICD-10-CM) - Cervicalgia M47.812 (ICD-10-CM) - Spondylosis without myelopathy or radiculopathy, cervical region M75.41 (ICD-10-CM) - Impingement syndrome of right shoulder M75.42 (ICD-10-CM) - Impingement syndrome of left shoulder   ? ?THERAPY DIAG:  ?Cervicalgia ? ?Cramp and spasm ? ?Muscle weakness (generalized) ? ?PERTINENT HISTORY: Fibromyalgia, ongoing dizziness going to be worked up at neurologist ? ?PRECAUTIONS: n/a ? ?ONSET DATE: 03/25/2021   ? ?SUBJECTIVE: Patient reports he is doing ok.  Neck is still painful and right shoulder is painful.    ? ?PAIN:  ?Are you having pain? Yes: NPRS scale: 6/10 ?Pain location: cervical into bilat shoulders ?Pain description: radiating down arms ?Aggravating factors: worse in the AM and with activity ?Relieving factors: CBD oil ? ?OBJECTIVE:  ?  ?DIAGNOSTIC FINDINGS:  ?03/25/2021 Cervical Radiograph:  AP and lateral radiographs in flexion and extension of the cervical spine demonstrate disc narrowing C4-5  ?C5-6, C6-7 and C7-T1. Spondylosis changes bilateral uncovertebral joints  ?C5-6, C6-7and C7-T1. Loss of normal Cervical lordotic curve due to anterior column shortening.  ?  ?PATIENT SURVEYS:  ?FOTO 38% at initial  evaluation (projected of 55% by visit 13)  ?  ?SENSATION: ?03/31/21: Light touch: Impaired Pt reports impaired sensation on the right side ?  ?POSTURE:  ?03/31/21: ?Forward head, rounded shoulders ?  ?PALPATION: ?03/31/21: ?Tender to palpation along cervical paraspinals.  Muscle spasms noted upper trap and cervical multifidi.    ?  ?CERVICAL ROM:  ?  ?Active ROM A/PROM (deg) ?03/31/2021  ?Flexion 25  ?Extension 5  ?Right lateral flexion 25  ?Left lateral flexion 5  ?Right rotation 28  ?Left rotation 22  ? (Blank rows = not tested) ?  ?UE ROM: ?  ?Active ROM Right ?03/31/2021 Left ?03/31/2021  ?Shoulder flexion 99 155  ?Shoulder extension      ?Shoulder abduction 112 145  ?Shoulder adduction      ?Shoulder extension      ?Shoulder internal rotation      ?Shoulder external rotation      ?Elbow flexion      ?Elbow extension      ?Wrist flexion      ?Wrist extension      ?Wrist ulnar deviation      ?Wrist radial deviation      ?Wrist pronation      ?Wrist supination      ? (Blank rows = not tested) ?  ?UE MMT: ?  ?MMT Right ?03/31/2021 Left ?03/31/2021  ?Shoulder flexion 3 4  ?Shoulder extension      ?Shoulder abduction 3 4  ?Shoulder adduction      ?Shoulder extension      ?  Shoulder internal rotation      ?Shoulder external rotation      ?Middle trapezius      ?Lower trapezius      ?Elbow flexion      ?Elbow extension      ?Wrist flexion      ?Wrist extension      ?Wrist ulnar deviation      ?Wrist radial deviation      ?Wrist pronation      ?Wrist supination      ?Grip strength 45 60  ? (Blank rows = not tested) ?  ?FUNCTIONAL TESTS:  ?03/31/21: 5 times sit to stand: 22 sec without UE.  However, pt states that he is going slow secondary dizziness ?  ?  ?TODAY'S TREATMENT:   ?04/14/21: ?UBE x 5 min ( 30 sec ?Tband shoulder extension, rows, bilateral ER and horizontal abduction x 20 each with red band (patient had to stop at 16 on bilateral shoulder ER due to shoulder and neck pain and then stopped at 15 reps due to neck  pain) ?Manual techniques: PROM cervical spine rotation and side bending,  upper trap stretch, levator stretch.  ? ?04/05/21: ?Trigger Point Dry-Needling  ?Treatment instructions: Expect mild to moderate muscle soreness. S/S of pneumothorax if dry needled over a lung field, and to seek immediate medical attention should they occur. Patient verbalized understanding of these instructions and education. ? ?Patient Consent Given: Yes ?Education handout provided: Previously provided ?Muscles treated: bil upper traps, bil SO, bil cervical paraspinals, Rt infraspinatus, Rt posterior deltoid ?Electrical stimulation performed: No ?Parameters: N/A ?Treatment response/outcome: twitch/elongation, improved cervical SB and Rot with less pain, improved ROM end of session ? ?STM to all muscles s/p DN ?A/ROM cervical SB 3x10 sec ?A/ROM bil cervical Rot painfree range x 3 cycles ? ? ?03/31/2021: reviewed HEP issued.  Discussed dry needling. ?  ?  ?PATIENT EDUCATION:  ?Education details: Issued HEP ?Person educated: Patient ?Education method: Explanation, Demonstration, and Handouts ?Education comprehension: verbalized understanding and returned demonstration ?  ?  ?HOME EXERCISE PROGRAM: ?Access Code: [email or web address] ?URL: [email or web address] ?Date: 03/31/2021 ?Prepared by: Reather Laurence ?  ?Exercises ?- Seated Upper Trapezius Stretch  - 1 x daily - 7 x weekly - 1 sets - 2 reps - 20 sec hold ?- Seated Cervical Retraction  - 2 x daily - 7 x weekly - 2 sets - 10 reps ?- Seated Scapular Retraction  - 1 x daily - 7 x weekly - 2 sets - 10 reps ?- Seated Shoulder Flexion Towel Slide at Table Top  - 1 x daily - 7 x weekly - 2 sets - 10 reps ?- Supine Chin Tuck  - 1 x daily - 7 x weekly - 2 sets - 10 reps ?  ?ASSESSMENT: ?  ?CLINICAL IMPRESSION: ?Session focused on postural strengthening.  Had planned on adding right shoulder stabiization but patient had pain with basic tband exercises and could not complete all reps.  Therefore we  spent remaining time on manual techniques.  Right upper trap with large trigger point.  Limited passive rotation and side bending.  He would benefit from continued skilled PT for postural and right shoulder strengthening, DN for spasm and trigger points, cervical ROM and right shoulder stabilization to progress toward goals and avoid more invasive intervention.   ?  ?  ?OBJECTIVE IMPAIRMENTS decreased ROM, decreased strength, dizziness, increased muscle spasms, impaired UE functional use, and pain.  ?  ?ACTIVITY LIMITATIONS cleaning and community activity.  ?  ?PERSONAL  FACTORS 1 comorbidity: Hx of cervicalgia with radiculopathy  are also affecting patient's functional outcome.  ?  ?  ?REHAB POTENTIAL: Good ?  ?CLINICAL DECISION MAKING: Evolving/moderate complexity ?  ?EVALUATION COMPLEXITY: Moderate ?  ?  ?GOALS: ?Goals reviewed with patient? Yes ?  ?SHORT TERM GOALS: Target date: 04/21/2021 ?  ?Pt will be independent with initial HEP. ?Baseline:  ?Goal status: INITIAL ?  ?2.  Pt will report a 40% improvement in symptoms since initial evaluation. ?Baseline:  ?Goal status: INITIAL ?  ?  ?LONG TERM GOALS: Target date: 05/26/2021 ?  ?Pt will be independent with advanced HEP. ?Baseline:  ?Goal status: INITIAL ?  ?2.  Pt will have FOTO of at least 55% to demonstrate increased functional mobility. ?Baseline: 38% ?Goal status: INITIAL ?  ?3.  Pt will increase cervical A/ROM by at least 10 degrees to allow him to observe his surroundings with hiking. ?Baseline: see chart ?Goal status: INITIAL ?  ?4.  Pt will increase R shoulder flexion and abduction A/ROM to at least 120 degrees to allow him to reach into overhead cabinets. ?Baseline: R shoulder flexion 99 degrees, R shoulder abduction 112 degrees. ?Goal status: INITIAL ?  ?5.  Pt will increase R shoulder strength to at least 4+/5 to allow him to perform activities around home. ?Baseline:  ?Goal status: INITIAL ?  ?  ?PLAN: ?PT FREQUENCY: 2x/week ?  ?PT DURATION: 8 weeks ?   ?PLANNED INTERVENTIONS: Therapeutic exercises, Therapeutic activity, Neuromuscular re-education, Balance training, Gait training, Patient/Family education, Joint manipulation, Joint mobilization, Vestibular trainin

## 2021-04-18 DIAGNOSIS — F411 Generalized anxiety disorder: Secondary | ICD-10-CM | POA: Diagnosis not present

## 2021-04-18 DIAGNOSIS — F331 Major depressive disorder, recurrent, moderate: Secondary | ICD-10-CM | POA: Diagnosis not present

## 2021-04-19 ENCOUNTER — Ambulatory Visit: Payer: BC Managed Care – PPO | Admitting: Physical Therapy

## 2021-04-19 ENCOUNTER — Encounter: Payer: Self-pay | Admitting: Physical Therapy

## 2021-04-19 DIAGNOSIS — R252 Cramp and spasm: Secondary | ICD-10-CM | POA: Diagnosis not present

## 2021-04-19 DIAGNOSIS — M6281 Muscle weakness (generalized): Secondary | ICD-10-CM | POA: Diagnosis not present

## 2021-04-19 DIAGNOSIS — M542 Cervicalgia: Secondary | ICD-10-CM

## 2021-04-19 NOTE — Therapy (Signed)
?OUTPATIENT PHYSICAL THERAPY TREATMENT NOTE ? ? ?Patient Name: Henry Jennings ?MRN: [id number] ?DOB:Jan 23, 1970, 51 y.o., male ?Today's Date: 04/19/2021 ? ?PCP: No primary care provider on file. ?REFERRING PROVIDER: Kerrin Champagne, MD ? ? PT End of Session - 04/19/21 9562   ? ? Visit Number 5   ? Date for PT Re-Evaluation 05/27/21   ? Authorization Type BC/BS   ? PT Start Time [id number]   ? PT Stop Time 1018   ? PT Time Calculation (min) 41 min   ? Activity Tolerance Patient tolerated treatment well   ? Behavior During Therapy [facility] for tasks assessed/performed   ? ?  ?  ? ?  ? ? ?Past Medical History:  ?Diagnosis Date  ? Depression   ? ?History reviewed. No pertinent surgical history. ?There are no problems to display for this patient. ? ? ?REFERRING DIAG: M54.2 (ICD-10-CM) - Cervicalgia M47.812 (ICD-10-CM) - Spondylosis without myelopathy or radiculopathy, cervical region M75.41 (ICD-10-CM) - Impingement syndrome of right shoulder M75.42 (ICD-10-CM) - Impingement syndrome of left shoulder   ? ?THERAPY DIAG:  ?Cervicalgia ? ?Cramp and spasm ? ?Muscle weakness (generalized) ? ?PERTINENT HISTORY: Fibromyalgia, ongoing dizziness going to be worked up at neurologist ? ?PRECAUTIONS: n/a ? ?ONSET DATE: 03/25/2021   ? ?SUBJECTIVE: I had some of the best days I've had after DN but no relief with the exercise based visit last time.   ? ?PAIN:  ?Are you having pain? Yes: NPRS scale: 6.5/10 ?Pain location: cervical into bilat shoulders ?Pain description: radiating down arms ?Aggravating factors: worse in the AM and with activity ?Relieving factors: CBD oil ? ?OBJECTIVE:  ?  ?DIAGNOSTIC FINDINGS:  ?03/25/2021 Cervical Radiograph:  AP and lateral radiographs in flexion and extension of the cervical spine demonstrate disc narrowing C4-5  ?C5-6, C6-7 and C7-T1. Spondylosis changes bilateral uncovertebral joints  ?C5-6, C6-7and C7-T1. Loss of normal Cervical lordotic curve due to anterior column shortening.  ?  ?PATIENT SURVEYS:  ?FOTO 38%  at initial evaluation (projected of 55% by visit 13)  ?  ?SENSATION: ?03/31/21: Light touch: Impaired Pt reports impaired sensation on the right side ?  ?POSTURE:  ?03/31/21: ?Forward head, rounded shoulders ?  ?PALPATION: ?03/31/21: ?Tender to palpation along cervical paraspinals.  Muscle spasms noted upper trap and cervical multifidi.    ?  ?CERVICAL ROM:  ?  ?Active ROM A/PROM (deg) ?03/31/2021  ?Flexion 25  ?Extension 5  ?Right lateral flexion 25  ?Left lateral flexion 5  ?Right rotation 28  ?Left rotation 22  ? (Blank rows = not tested) ?  ?UE ROM: ?  ?Active ROM Right ?03/31/2021 Left ?03/31/2021  ?Shoulder flexion 99 155  ?Shoulder extension      ?Shoulder abduction 112 145  ?Shoulder adduction      ?Shoulder extension      ?Shoulder internal rotation      ?Shoulder external rotation      ?Elbow flexion      ?Elbow extension      ?Wrist flexion      ?Wrist extension      ?Wrist ulnar deviation      ?Wrist radial deviation      ?Wrist pronation      ?Wrist supination      ? (Blank rows = not tested) ?  ?UE MMT: ?  ?MMT Right ?03/31/2021 Left ?03/31/2021  ?Shoulder flexion 3 4  ?Shoulder extension      ?Shoulder abduction 3 4  ?Shoulder adduction      ?Shoulder extension      ?  Shoulder internal rotation      ?Shoulder external rotation      ?Middle trapezius      ?Lower trapezius      ?Elbow flexion      ?Elbow extension      ?Wrist flexion      ?Wrist extension      ?Wrist ulnar deviation      ?Wrist radial deviation      ?Wrist pronation      ?Wrist supination      ?Grip strength 45 60  ? (Blank rows = not tested) ?  ?FUNCTIONAL TESTS:  ?03/31/21: 5 times sit to stand: 22 sec without UE.  However, pt states that he is going slow secondary dizziness ?  ?  ?TODAY'S TREATMENT:   ?04/19/21: ?Trigger Point Dry-Needling  ?Treatment instructions: Expect mild to moderate muscle soreness. S/S of pneumothorax if dry needled over a lung field, and to seek immediate medical attention should they occur. Patient verbalized  understanding of these instructions and education. ? ?Patient Consent Given: Yes ?Education handout provided: Previously provided ?Muscles treated: bil upper traps, Rt mutlifidi C7-T2, Rt infraspinatus, Rt posterior deltoid ?Electrical stimulation performed: No ?Parameters: N/A ?Treatment response/outcome: twitch/elongation, improved cervical SB and Rot with less pain, improved ROM end of session ? ? STM to bil upper traps after DN ? Joint mobs for extension Rt facets T1-T3 Gr II/III ? ?Therex: ?Supine chin nods with extension isometric into pillow 10x5" holds ?Doorway chest stretch 5x5" holds ?Standing levator stretch with light overpressue x 10" each to tolerance ? ? ?04/14/21: ?UBE x 5 min ( 30 sec ?Tband shoulder extension, rows, bilateral ER and horizontal abduction x 20 each with red band (patient had to stop at 16 on bilateral shoulder ER due to shoulder and neck pain and then stopped at 15 reps due to neck pain) ?Manual techniques: PROM cervical spine rotation and side bending,  upper trap stretch, levator stretch.  ? ?04/05/21: ?Trigger Point Dry-Needling  ?Treatment instructions: Expect mild to moderate muscle soreness. S/S of pneumothorax if dry needled over a lung field, and to seek immediate medical attention should they occur. Patient verbalized understanding of these instructions and education. ? ?Patient Consent Given: Yes ?Education handout provided: Previously provided ?Muscles treated: bil upper traps, bil SO, bil cervical paraspinals, Rt infraspinatus, Rt posterior deltoid ?Electrical stimulation performed: No ?Parameters: N/A ?Treatment response/outcome: twitch/elongation, improved cervical SB and Rot with less pain, improved ROM end of session ? ?STM to all muscles s/p DN ?A/ROM cervical SB 3x10 sec ?A/ROM bil cervical Rot painfree range x 3 cycles ? ? ?03/31/2021: reviewed HEP issued.  Discussed dry needling. ?  ?  ?PATIENT EDUCATION:  ?Education details: Issued HEP ?Person educated:  Patient ?Education method: Explanation, Demonstration, and Handouts ?Education comprehension: verbalized understanding and returned demonstration ?  ?  ?HOME EXERCISE PROGRAM: ?Access Code: [email or web address] ?URL: [email or web address] ?Date: 03/31/2021 ?Prepared by: Reather Laurence ?  ?Exercises ?- Seated Upper Trapezius Stretch  - 1 x daily - 7 x weekly - 1 sets - 2 reps - 20 sec hold ?- Seated Cervical Retraction  - 2 x daily - 7 x weekly - 2 sets - 10 reps ?- Seated Scapular Retraction  - 1 x daily - 7 x weekly - 2 sets - 10 reps ?- Seated Shoulder Flexion Towel Slide at Table Top  - 1 x daily - 7 x weekly - 2 sets - 10 reps ?- Supine Chin Tuck  - 1 x daily - 7 x weekly -  2 sets - 10 reps ?  ?ASSESSMENT: ?  ?CLINICAL IMPRESSION: ?Pt arrived with report of multiple "best days I've had in a while" after DN.  PT repeated with signif twitch and release of all targeted muscles.  Pt continues to have limited and painful ROM in all cervical planes but this is slowly improving.  PT added doorway stretch for pectorals today and encouraged light overpressure be added as tol to cervical stretches.   ?  ?  ?OBJECTIVE IMPAIRMENTS decreased ROM, decreased strength, dizziness, increased muscle spasms, impaired UE functional use, and pain.  ?  ?ACTIVITY LIMITATIONS cleaning and community activity.  ?  ?PERSONAL FACTORS 1 comorbidity: Hx of cervicalgia with radiculopathy  are also affecting patient's functional outcome.  ?  ?  ?REHAB POTENTIAL: Good ?  ?CLINICAL DECISION MAKING: Evolving/moderate complexity ?  ?EVALUATION COMPLEXITY: Moderate ?  ?  ?GOALS: ?Goals reviewed with patient? Yes ?  ?SHORT TERM GOALS: Target date: 04/21/2021 ?  ?Pt will be independent with initial HEP. ?Baseline:  ?Goal status: INITIAL ?  ?2.  Pt will report a 40% improvement in symptoms since initial evaluation. ?Baseline:  ?Goal status: INITIAL ?  ?  ?LONG TERM GOALS: Target date: 05/26/2021 ?  ?Pt will be independent with advanced HEP. ?Baseline:   ?Goal status: INITIAL ?  ?2.  Pt will have FOTO of at least 55% to demonstrate increased functional mobility. ?Baseline: 38% ?Goal status: INITIAL ?  ?3.  Pt will increase cervical A/ROM by at least 10 degrees to allow

## 2021-04-21 ENCOUNTER — Encounter: Payer: Self-pay | Admitting: Rehabilitative and Restorative Service Providers"

## 2021-04-22 ENCOUNTER — Encounter: Payer: BC Managed Care – PPO | Admitting: Physical Therapy

## 2021-04-25 DIAGNOSIS — F411 Generalized anxiety disorder: Secondary | ICD-10-CM | POA: Diagnosis not present

## 2021-04-25 DIAGNOSIS — F331 Major depressive disorder, recurrent, moderate: Secondary | ICD-10-CM | POA: Diagnosis not present

## 2021-04-26 ENCOUNTER — Encounter: Payer: Self-pay | Admitting: Rehabilitative and Restorative Service Providers"

## 2021-04-28 ENCOUNTER — Ambulatory Visit: Payer: Self-pay | Admitting: Specialist

## 2021-04-28 ENCOUNTER — Encounter: Payer: Self-pay | Admitting: Rehabilitative and Restorative Service Providers"

## 2021-04-28 DIAGNOSIS — F902 Attention-deficit hyperactivity disorder, combined type: Secondary | ICD-10-CM | POA: Diagnosis not present

## 2021-04-28 DIAGNOSIS — F4312 Post-traumatic stress disorder, chronic: Secondary | ICD-10-CM | POA: Diagnosis not present

## 2021-04-28 DIAGNOSIS — F332 Major depressive disorder, recurrent severe without psychotic features: Secondary | ICD-10-CM | POA: Diagnosis not present

## 2021-04-29 ENCOUNTER — Ambulatory Visit: Payer: BC Managed Care – PPO | Admitting: Physical Therapy

## 2021-04-29 DIAGNOSIS — M6281 Muscle weakness (generalized): Secondary | ICD-10-CM

## 2021-04-29 DIAGNOSIS — M542 Cervicalgia: Secondary | ICD-10-CM | POA: Diagnosis not present

## 2021-04-29 DIAGNOSIS — R252 Cramp and spasm: Secondary | ICD-10-CM | POA: Diagnosis not present

## 2021-04-29 NOTE — Therapy (Signed)
?OUTPATIENT PHYSICAL THERAPY TREATMENT NOTE ? ? ?Patient Name: Henry Jennings ?MRN: [id number] ?DOB:March 09, 1970, 51 y.o., male ?Today's Date: 04/29/2021 ? ?PCP: No primary care provider on file. ?REFERRING PROVIDER: Kerrin Champagne, MD ? ? PT End of Session - 04/29/21 0805   ? ? Visit Number 6   ? Date for PT Re-Evaluation 05/27/21   ? Authorization Type BC/BS   ? PT Start Time 0805   ? PT Stop Time [phone]   ? PT Time Calculation (min) 38 min   ? ?  ?  ? ?  ? ? ?Past Medical History:  ?Diagnosis Date  ? Depression   ? ?No past surgical history on file. ?There are no problems to display for this patient. ? ? ?REFERRING DIAG: M54.2 (ICD-10-CM) - Cervicalgia M47.812 (ICD-10-CM) - Spondylosis without myelopathy or radiculopathy, cervical region M75.41 (ICD-10-CM) - Impingement syndrome of right shoulder M75.42 (ICD-10-CM) - Impingement syndrome of left shoulder   ? ?THERAPY DIAG:  ?Cervicalgia ? ?Cramp and spasm ? ?Muscle weakness (generalized) ? ?PERTINENT HISTORY: Fibromyalgia, ongoing dizziness going to be worked up at neurologist ? ?PRECAUTIONS: n/a ? ?ONSET DATE: 03/25/2021   ? ?SUBJECTIVE: Today is not too bad.  Right > left upper trap/neck.  Right forearm pain.  ? ?PAIN:  ?Are you having pain? Yes: NPRS scale: 5/10 ?Pain location: cervical into bilat shoulders ?Pain description: radiating down arms ?Aggravating factors: worse in the AM and with activity ?Relieving factors: CBD oil ? ?OBJECTIVE:  ?  ?DIAGNOSTIC FINDINGS:  ?03/25/2021 Cervical Radiograph:  AP and lateral radiographs in flexion and extension of the cervical spine demonstrate disc narrowing C4-5  ?C5-6, C6-7 and C7-T1. Spondylosis changes bilateral uncovertebral joints  ?C5-6, C6-7and C7-T1. Loss of normal Cervical lordotic curve due to anterior column shortening.  ?  ?PATIENT SURVEYS:  ?FOTO 38% at initial evaluation (projected of 55% by visit 13)  ?  ?SENSATION: ?03/31/21: Light touch: Impaired Pt reports impaired sensation on the right side ?  ?POSTURE:   ?03/31/21: ?Forward head, rounded shoulders ?  ?PALPATION: ?03/31/21: ?Tender to palpation along cervical paraspinals.  Muscle spasms noted upper trap and cervical multifidi.    ?  ?CERVICAL ROM:  ?  ?Active ROM A/PROM (deg) ?03/31/2021  ?Flexion 25  ?Extension 5  ?Right lateral flexion 25  ?Left lateral flexion 5  ?Right rotation 28  ?Left rotation 22  ? (Blank rows = not tested) ?  ?UE ROM: ?  ?Active ROM Right ?03/31/2021 Left ?03/31/2021  ?Shoulder flexion 99 155  ?Shoulder extension      ?Shoulder abduction 112 145  ?Shoulder adduction      ?Shoulder extension      ?Shoulder internal rotation      ?Shoulder external rotation      ?Elbow flexion      ?Elbow extension      ?Wrist flexion      ?Wrist extension      ?Wrist ulnar deviation      ?Wrist radial deviation      ?Wrist pronation      ?Wrist supination      ? (Blank rows = not tested) ?  ?UE MMT: ?  ?MMT Right ?03/31/2021 Left ?03/31/2021  ?Shoulder flexion 3 4  ?Shoulder extension      ?Shoulder abduction 3 4  ?Shoulder adduction      ?Shoulder extension      ?Shoulder internal rotation      ?Shoulder external rotation      ?Middle trapezius      ?  Lower trapezius      ?Elbow flexion      ?Elbow extension      ?Wrist flexion      ?Wrist extension      ?Wrist ulnar deviation      ?Wrist radial deviation      ?Wrist pronation      ?Wrist supination      ?Grip strength 45 60  ? (Blank rows = not tested) ?  ?FUNCTIONAL TESTS:  ?03/31/21: 5 times sit to stand: 22 sec without UE.  However, pt states that he is going slow secondary dizziness ?  ?  ?TODAY'S TREATMENT:   ?04/29/21: ? ?Trigger Point Dry-Needling  ?Treatment instructions: Expect mild to moderate muscle soreness. S/S of pneumothorax if dry needled over a lung field, and to seek immediate medical attention should they occur. Patient verbalized understanding of these instructions and education. ? ?Patient Consent Given: Yes ?Education handout provided: Previously provided ?Muscles treated: bil upper traps, Rt  mutlifidi C7-T2, Rt infraspinatus, Rt teres; right wrist extensors ?Electrical stimulation performed: No ?Parameters: N/A ?Treatment response/outcome: twitch/elongation, improved cervical SB and Rot with less pain, improved ROM end of session ? ? STM to bil upper traps, cervical paraspinals, periscapular muscles and right forearm extensors ?  ?Self care regarding management of fibromyalgia discussion of basic nutrition implications, self prioritization, graded exercise; value of isometrics ?Cervical isometrics and shoulder extension isometrics per HEP ? ?Verbal review of previous HEP:  Supine chin nods with extension isometric into pillow 10x5" holds ?Doorway chest stretch 5x5" holds ?Standing levator stretch with light overpressue x 10" each to tolerance ? ? ?04/19/21: ?Trigger Point Dry-Needling  ?Treatment instructions: Expect mild to moderate muscle soreness. S/S of pneumothorax if dry needled over a lung field, and to seek immediate medical attention should they occur. Patient verbalized understanding of these instructions and education. ? ?Patient Consent Given: Yes ?Education handout provided: Previously provided ?Muscles treated: bil upper traps, Rt mutlifidi C7-T2, Rt infraspinatus, Rt posterior deltoid ?Electrical stimulation performed: No ?Parameters: N/A ?Treatment response/outcome: twitch/elongation, improved cervical SB and Rot with less pain, improved ROM end of session ? ? STM to bil upper traps after DN ? Joint mobs for extension Rt facets T1-T3 Gr II/III ? ?Therex: ?Supine chin nods with extension isometric into pillow 10x5" holds ?Doorway chest stretch 5x5" holds ?Standing levator stretch with light overpressue x 10" each to tolerance ? ? ?04/14/21: ?UBE x 5 min ( 30 sec ?Tband shoulder extension, rows, bilateral ER and horizontal abduction x 20 each with red band (patient had to stop at 16 on bilateral shoulder ER due to shoulder and neck pain and then stopped at 15 reps due to neck  pain) ?Manual techniques: PROM cervical spine rotation and side bending,  upper trap stretch, levator stretch.  ? ?04/05/21: ?Trigger Point Dry-Needling  ?Treatment instructions: Expect mild to moderate muscle soreness. S/S of pneumothorax if dry needled over a lung field, and to seek immediate medical attention should they occur. Patient verbalized understanding of these instructions and education. ? ?Patient Consent Given: Yes ?Education handout provided: Previously provided ?Muscles treated: bil upper traps, bil SO, bil cervical paraspinals, Rt infraspinatus, Rt posterior deltoid ?Electrical stimulation performed: No ?Parameters: N/A ?Treatment response/outcome: twitch/elongation, improved cervical SB and Rot with less pain, improved ROM end of session ? ?STM to all muscles s/p DN ?A/ROM cervical SB 3x10 sec ?A/ROM bil cervical Rot painfree range x 3 cycles ? ? ?03/31/2021: reviewed HEP issued.  Discussed dry needling. ?  ?  ?  PATIENT EDUCATION:  ?Education details: Issued HEP ?Person educated: Patient ?Education method: Explanation, Demonstration, and Handouts ?Education comprehension: verbalized understanding and returned demonstration ?  ?  ?HOME EXERCISE PROGRAM: ?Access Code: [email or web address] ?Access Code: [email or web address] ?URL: [email or web address] ?Date: 04/29/2021 ?Prepared by: Lavinia Sharps ? ?Exercises ?- Seated Upper Trapezius Stretch  - 1 x daily - 7 x weekly - 1 sets - 2 reps - 20 sec hold ?- Seated Cervical Retraction  - 2 x daily - 7 x weekly - 2 sets - 10 reps ?- Seated Scapular Retraction  - 1 x daily - 7 x weekly - 2 sets - 10 reps ?- Seated Shoulder Flexion Towel Slide at Table Top  - 1 x daily - 7 x weekly - 2 sets - 10 reps ?- Supine Chin Tuck  - 1 x daily - 7 x weekly - 2 sets - 10 reps ?- Standing Anatomical Position with Scapular Retraction and Depression at Wall  - 1 x daily - 7 x weekly - 1 sets - 5 reps - 8 hold ?- Seated Isometric Cervical Rotation  - 1 x daily - 7 x weekly - 1 sets - 5  reps - 8 hold ?- Seated Isometric Cervical Sidebending  - 1 x daily - 7 x weekly - 1 sets - 5 reps - 8 hold ?- Seated Isometric Cervical Flexion  - 1 x daily - 7 x weekly - 1 sets - 5 reps - 8 hold ?- Seated Isometri

## 2021-05-02 DIAGNOSIS — F411 Generalized anxiety disorder: Secondary | ICD-10-CM | POA: Diagnosis not present

## 2021-05-02 DIAGNOSIS — F331 Major depressive disorder, recurrent, moderate: Secondary | ICD-10-CM | POA: Diagnosis not present

## 2021-05-03 ENCOUNTER — Encounter: Payer: Self-pay | Admitting: Rehabilitative and Restorative Service Providers"

## 2021-05-03 ENCOUNTER — Ambulatory Visit: Payer: BC Managed Care – PPO | Attending: Specialist | Admitting: Rehabilitative and Restorative Service Providers"

## 2021-05-03 DIAGNOSIS — M542 Cervicalgia: Secondary | ICD-10-CM | POA: Insufficient documentation

## 2021-05-03 DIAGNOSIS — M6281 Muscle weakness (generalized): Secondary | ICD-10-CM | POA: Diagnosis not present

## 2021-05-03 DIAGNOSIS — R252 Cramp and spasm: Secondary | ICD-10-CM | POA: Insufficient documentation

## 2021-05-03 NOTE — Therapy (Signed)
?OUTPATIENT PHYSICAL THERAPY TREATMENT NOTE ? ? ?Patient Name: Henry Jennings ?MRN: [id number] ?DOB:[birth year], 51 y.o., male ?Today's Date: 05/03/2021 ? ?PCP: No primary care provider on file. ?REFERRING PROVIDER: Kerrin Champagne, MD ? ? PT End of Session - 05/03/21 1401   ? ? Visit Number 7   ? Date for PT Re-Evaluation 05/27/21   ? Authorization Type BC/BS   ? PT Start Time 1400   ? PT Stop Time 1440   ? PT Time Calculation (min) 40 min   ? Activity Tolerance Patient tolerated treatment well   ? Behavior During Therapy [facility] for tasks assessed/performed   ? ?  ?  ? ?  ? ? ?Past Medical History:  ?Diagnosis Date  ? Depression   ? ?History reviewed. No pertinent surgical history. ?There are no problems to display for this patient. ? ? ?REFERRING DIAG: M54.2 (ICD-10-CM) - Cervicalgia M47.812 (ICD-10-CM) - Spondylosis without myelopathy or radiculopathy, cervical region M75.41 (ICD-10-CM) - Impingement syndrome of right shoulder M75.42 (ICD-10-CM) - Impingement syndrome of left shoulder   ? ?THERAPY DIAG:  ?Cervicalgia ? ?Cramp and spasm ? ?Muscle weakness (generalized) ? ?PERTINENT HISTORY: Fibromyalgia, ongoing dizziness going to be worked up at neurologist ? ?PRECAUTIONS: n/a ? ?ONSET DATE: 03/25/2021   ? ?SUBJECTIVE: Pt reports that he can tell that he has made a lot of progress since he started coming to PT ? ?PAIN:  ?Are you having pain? Yes: NPRS scale: 5/10 ?Pain location: cervical into bilat shoulders ?Pain description: radiating down arms ?Aggravating factors: worse in the AM and with activity ?Relieving factors: CBD oil ? ?OBJECTIVE:  ?  ?DIAGNOSTIC FINDINGS:  ?03/25/2021 Cervical Radiograph:  AP and lateral radiographs in flexion and extension of the cervical spine demonstrate disc narrowing C4-5  ?C5-6, C6-7 and C7-T1. Spondylosis changes bilateral uncovertebral joints  ?C5-6, C6-7and C7-T1. Loss of normal Cervical lordotic curve due to anterior column shortening.  ?  ?PATIENT SURVEYS:  ?FOTO 38% at initial  evaluation (projected of 55% by visit 13)  ?  ?SENSATION: ?03/31/21: Light touch: Impaired Pt reports impaired sensation on the right side ?  ?POSTURE:  ?03/31/21: ?Forward head, rounded shoulders ?  ?PALPATION: ?03/31/21: ?Tender to palpation along cervical paraspinals.  Muscle spasms noted upper trap and cervical multifidi.    ?  ?CERVICAL ROM:  ?  ?Active ROM A/PROM (deg) ?03/31/2021 A/ROM ?(Deg) ?05/03/2021  ?Flexion 25 50  ?Extension 5 10  ?Right lateral flexion 25 25  ?Left lateral flexion 5 20  ?Right rotation 28 40  ?Left rotation 22 30  ? (Blank rows = not tested) ?  ?UE ROM: ?  ?Active ROM Right ?03/31/2021 Left ?03/31/2021  ?Shoulder flexion 99 155  ?Shoulder extension      ?Shoulder abduction 112 145  ?Shoulder adduction      ?Shoulder extension      ?Shoulder internal rotation      ?Shoulder external rotation      ?Elbow flexion      ?Elbow extension      ?Wrist flexion      ?Wrist extension      ?Wrist ulnar deviation      ?Wrist radial deviation      ?Wrist pronation      ?Wrist supination      ? (Blank rows = not tested) ?  ?UE MMT: ?  ?MMT Right ?03/31/2021 Left ?03/31/2021  ?Shoulder flexion 3 4  ?Shoulder extension      ?Shoulder abduction 3 4  ?Shoulder adduction      ?  Shoulder extension      ?Shoulder internal rotation      ?Shoulder external rotation      ?Middle trapezius      ?Lower trapezius      ?Elbow flexion      ?Elbow extension      ?Wrist flexion      ?Wrist extension      ?Wrist ulnar deviation      ?Wrist radial deviation      ?Wrist pronation      ?Wrist supination      ?Grip strength 45 60  ? (Blank rows = not tested) ?  ?FUNCTIONAL TESTS:  ?03/31/21: 5 times sit to stand: 22 sec without UE.  However, pt states that he is going slow secondary dizziness ?  ?  ?TODAY'S TREATMENT:   ? ?05/03/2021: ?UBE level 1.0 x2 min each way with PT present to discuss status ?Seated upper trap and levator stretch x15 sec bilat ?Seated with red tband:  shoulder ER, horizontal abduction, rows.  2x10 bilat ?3 way  scapular stabilization on wall with green loop.  X5 bilat ?Standing cervical retraction with head to wall 2x10 ?Lat pull 25# 2x10 ?Wall push ups 2x10 ?Doorway pec stretch 2x15 sec, Deltoid stretch 2x15 sec bilat, 90 degree counter stretch at barre 2x20 sec ?DRY NEEDLING: ?Dry needling consent given? yes ?Educational handouts provided? yes ?Muscles treated: bilat cervical and thoracic multifidi, bilat upper trap, R delts, R triceps, R wrist extensors ?Response from dry needling: twitch response and muscle elongation. Utilized skilled palpation to identify trigger points ? ?04/29/21: ? ?Trigger Point Dry-Needling  ?Treatment instructions: Expect mild to moderate muscle soreness. S/S of pneumothorax if dry needled over a lung field, and to seek immediate medical attention should they occur. Patient verbalized understanding of these instructions and education. ? ?Patient Consent Given: Yes ?Education handout provided: Previously provided ?Muscles treated: bil upper traps, Rt mutlifidi C7-T2, Rt infraspinatus, Rt teres; right wrist extensors ?Electrical stimulation performed: No ?Parameters: N/A ?Treatment response/outcome: twitch/elongation, improved cervical SB and Rot with less pain, improved ROM end of session ? ? STM to bil upper traps, cervical paraspinals, periscapular muscles and right forearm extensors ?  ?Self care regarding management of fibromyalgia discussion of basic nutrition implications, self prioritization, graded exercise; value of isometrics ?Cervical isometrics and shoulder extension isometrics per HEP ? ?Verbal review of previous HEP:  Supine chin nods with extension isometric into pillow 10x5" holds ?Doorway chest stretch 5x5" holds ?Standing levator stretch with light overpressue x 10" each to tolerance ? ? ?04/19/21: ?Trigger Point Dry-Needling  ?Treatment instructions: Expect mild to moderate muscle soreness. S/S of pneumothorax if dry needled over a lung field, and to seek immediate medical  attention should they occur. Patient verbalized understanding of these instructions and education. ? ?Patient Consent Given: Yes ?Education handout provided: Previously provided ?Muscles treated: bil upper traps, Rt mutlifidi C7-T2, Rt infraspinatus, Rt posterior deltoid ?Electrical stimulation performed: No ?Parameters: N/A ?Treatment response/outcome: twitch/elongation, improved cervical SB and Rot with less pain, improved ROM end of session ? ? STM to bil upper traps after DN ? Joint mobs for extension Rt facets T1-T3 Gr II/III ? ?Therex: ?Supine chin nods with extension isometric into pillow 10x5" holds ?Doorway chest stretch 5x5" holds ?Standing levator stretch with light overpressue x 10" each to tolerance ? ? ?  ?  ?PATIENT EDUCATION:  ?Education details: Issued HEP ?Person educated: Patient ?Education method: Explanation, Demonstration, and Handouts ?Education comprehension: verbalized understanding and returned demonstration ?  ?  ?  HOME EXERCISE PROGRAM: ? ?Access Code: [email or web address] ?URL: [email or web address] ?Date: 04/29/2021 ?Prepared by: Lavinia Sharps ? ?Exercises ?- Seated Upper Trapezius Stretch  - 1 x daily - 7 x weekly - 1 sets - 2 reps - 20 sec hold ?- Seated Cervical Retraction  - 2 x daily - 7 x weekly - 2 sets - 10 reps ?- Seated Scapular Retraction  - 1 x daily - 7 x weekly - 2 sets - 10 reps ?- Seated Shoulder Flexion Towel Slide at Table Top  - 1 x daily - 7 x weekly - 2 sets - 10 reps ?- Supine Chin Tuck  - 1 x daily - 7 x weekly - 2 sets - 10 reps ?- Standing Anatomical Position with Scapular Retraction and Depression at Wall  - 1 x daily - 7 x weekly - 1 sets - 5 reps - 8 hold ?- Seated Isometric Cervical Rotation  - 1 x daily - 7 x weekly - 1 sets - 5 reps - 8 hold ?- Seated Isometric Cervical Sidebending  - 1 x daily - 7 x weekly - 1 sets - 5 reps - 8 hold ?- Seated Isometric Cervical Flexion  - 1 x daily - 7 x weekly - 1 sets - 5 reps - 8 hold ?- Seated Isometric Cervical  Extension  - 1 x daily - 7 x weekly - 1 sets - 5 reps - 8 hold ?  ?ASSESSMENT: ?  ?CLINICAL IMPRESSION: ?The patient reports a good overall response to DN and is receptive to continuing to address myofascial issu

## 2021-05-05 ENCOUNTER — Ambulatory Visit: Payer: BC Managed Care – PPO | Admitting: Rehabilitative and Restorative Service Providers"

## 2021-05-05 ENCOUNTER — Encounter: Payer: Self-pay | Admitting: Rehabilitative and Restorative Service Providers"

## 2021-05-05 DIAGNOSIS — M542 Cervicalgia: Secondary | ICD-10-CM

## 2021-05-05 DIAGNOSIS — R252 Cramp and spasm: Secondary | ICD-10-CM

## 2021-05-05 DIAGNOSIS — M6281 Muscle weakness (generalized): Secondary | ICD-10-CM

## 2021-05-05 NOTE — Therapy (Signed)
?OUTPATIENT PHYSICAL THERAPY TREATMENT NOTE ? ? ?Patient Name: Henry Jennings ?MRN: [id number] ?DOB:1970/07/06, 51 y.o., male ?Today's Date: 05/05/2021 ? ?PCP: No primary care provider on file. ?REFERRING PROVIDER: Kerrin Champagne, MD ? ? PT End of Session - 05/05/21 1147   ? ? Visit Number 8   ? Date for PT Re-Evaluation 05/27/21   ? Authorization Type BC/BS   ? PT Start Time 1145   ? PT Stop Time 1225   ? PT Time Calculation (min) 40 min   ? Activity Tolerance Patient tolerated treatment well   ? Behavior During Therapy [facility] for tasks assessed/performed   ? ?  ?  ? ?  ? ? ?Past Medical History:  ?Diagnosis Date  ? Depression   ? ?History reviewed. No pertinent surgical history. ?There are no problems to display for this patient. ? ? ?REFERRING DIAG: M54.2 (ICD-10-CM) - Cervicalgia M47.812 (ICD-10-CM) - Spondylosis without myelopathy or radiculopathy, cervical region M75.41 (ICD-10-CM) - Impingement syndrome of right shoulder M75.42 (ICD-10-CM) - Impingement syndrome of left shoulder   ? ?THERAPY DIAG:  ?Cervicalgia ? ?Cramp and spasm ? ?Muscle weakness (generalized) ? ?PERTINENT HISTORY: Fibromyalgia, ongoing dizziness going to be worked up at neurologist ? ?PRECAUTIONS: n/a ? ?ONSET DATE: 03/25/2021   ? ?SUBJECTIVE: Pt reports that he can tell that he has made a lot of progress since he started coming to PT ? ?PAIN:  ?Are you having pain? Yes: NPRS scale: 5/10 ?Pain location: cervical into bilat shoulders ?Pain description: radiating down arms ?Aggravating factors: worse in the AM and with activity ?Relieving factors: CBD oil ? ?OBJECTIVE:  ?  ?DIAGNOSTIC FINDINGS:  ?03/25/2021 Cervical Radiograph:  AP and lateral radiographs in flexion and extension of the cervical spine demonstrate disc narrowing C4-5  ?C5-6, C6-7 and C7-T1. Spondylosis changes bilateral uncovertebral joints  ?C5-6, C6-7and C7-T1. Loss of normal Cervical lordotic curve due to anterior column shortening.  ?  ?PATIENT SURVEYS:  ?FOTO 38% at initial  evaluation (projected of 55% by visit 13)  ?  ?SENSATION: ?03/31/21: Light touch: Impaired Pt reports impaired sensation on the right side ?  ?POSTURE:  ?03/31/21: ?Forward head, rounded shoulders ?  ?PALPATION: ?03/31/21: ?Tender to palpation along cervical paraspinals.  Muscle spasms noted upper trap and cervical multifidi.    ?  ?CERVICAL ROM:  ?  ?Active ROM A/PROM (deg) ?03/31/2021 A/ROM ?(Deg) ?05/03/2021  ?Flexion 25 50  ?Extension 5 10  ?Right lateral flexion 25 25  ?Left lateral flexion 5 20  ?Right rotation 28 40  ?Left rotation 22 30  ? (Blank rows = not tested) ?  ?UE ROM: ?  ?Active ROM Right ?03/31/2021 Left ?03/31/2021  ?Shoulder flexion 99 155  ?Shoulder extension      ?Shoulder abduction 112 145  ?Shoulder adduction      ?Shoulder extension      ?Shoulder internal rotation      ?Shoulder external rotation      ?Elbow flexion      ?Elbow extension      ?Wrist flexion      ?Wrist extension      ?Wrist ulnar deviation      ?Wrist radial deviation      ?Wrist pronation      ?Wrist supination      ? (Blank rows = not tested) ?  ?UE MMT: ?  ?MMT Right ?03/31/2021 Left ?03/31/2021  ?Shoulder flexion 3 4  ?Shoulder extension      ?Shoulder abduction 3 4  ?Shoulder adduction      ?  Shoulder extension      ?Shoulder internal rotation      ?Shoulder external rotation      ?Middle trapezius      ?Lower trapezius      ?Elbow flexion      ?Elbow extension      ?Wrist flexion      ?Wrist extension      ?Wrist ulnar deviation      ?Wrist radial deviation      ?Wrist pronation      ?Wrist supination      ?Grip strength 45 60  ? (Blank rows = not tested) ?  ?FUNCTIONAL TESTS:  ?03/31/21: 5 times sit to stand: 22 sec without UE.  However, pt states that he is going slow secondary dizziness ?  ?  ?TODAY'S TREATMENT:   ? ?05/05/2021: ?UBE level 1.0 x2 min each way with PT present to discuss status ?Supine:  chin tucks into pillow, shoulder press, serratus punch with 2#, shoulder flexion with 2#, chest press with 2#.  BUE 2x10  each ?Supine UE D2 with red tband 2x10 bilat ?Manual therapy with soft tissue mobilization and manual trigger point release to cervical paraspinals, R upper trap, R wrist extensors, R biceps/triceps. ? ?05/03/2021: ?UBE level 1.0 x2 min each way with PT present to discuss status ?Seated upper trap and levator stretch x15 sec bilat ?Seated with red tband:  shoulder ER, horizontal abduction, rows.  2x10 bilat ?3 way scapular stabilization on wall with green loop.  X5 bilat ?Standing cervical retraction with head to wall 2x10 ?Lat pull 25# 2x10 ?Wall push ups 2x10 ?Doorway pec stretch 2x15 sec, Deltoid stretch 2x15 sec bilat, 90 degree counter stretch at barre 2x20 sec ?DRY NEEDLING: ?Dry needling consent given? yes ?Educational handouts provided? yes ?Muscles treated: bilat cervical and thoracic multifidi, bilat upper trap, R delts, R triceps, R wrist extensors ?Response from dry needling: twitch response and muscle elongation. Utilized skilled palpation to identify trigger points ? ?04/29/21: ? ?Trigger Point Dry-Needling  ?Treatment instructions: Expect mild to moderate muscle soreness. S/S of pneumothorax if dry needled over a lung field, and to seek immediate medical attention should they occur. Patient verbalized understanding of these instructions and education. ? ?Patient Consent Given: Yes ?Education handout provided: Previously provided ?Muscles treated: bil upper traps, Rt mutlifidi C7-T2, Rt infraspinatus, Rt teres; right wrist extensors ?Electrical stimulation performed: No ?Parameters: N/A ?Treatment response/outcome: twitch/elongation, improved cervical SB and Rot with less pain, improved ROM end of session ? ? STM to bil upper traps, cervical paraspinals, periscapular muscles and right forearm extensors ?  ?Self care regarding management of fibromyalgia discussion of basic nutrition implications, self prioritization, graded exercise; value of isometrics ?Cervical isometrics and shoulder extension  isometrics per HEP ? ?Verbal review of previous HEP:  Supine chin nods with extension isometric into pillow 10x5" holds ?Doorway chest stretch 5x5" holds ?Standing levator stretch with light overpressue x 10" each to tolerance ? ? ?  ?PATIENT EDUCATION:  ?Education details: Issued HEP ?Person educated: Patient ?Education method: Explanation, Demonstration, and Handouts ?Education comprehension: verbalized understanding and returned demonstration ?  ?  ?HOME EXERCISE PROGRAM: ? ?Access Code: [email or web address] ?URL: [email or web address] ?Date: 04/29/2021 ?Prepared by: Lavinia Sharps ? ?Exercises ?- Seated Upper Trapezius Stretch  - 1 x daily - 7 x weekly - 1 sets - 2 reps - 20 sec hold ?- Seated Cervical Retraction  - 2 x daily - 7 x weekly - 2 sets - 10 reps ?- Seated Scapular Retraction  -  1 x daily - 7 x weekly - 2 sets - 10 reps ?- Seated Shoulder Flexion Towel Slide at Table Top  - 1 x daily - 7 x weekly - 2 sets - 10 reps ?- Supine Chin Tuck  - 1 x daily - 7 x weekly - 2 sets - 10 reps ?- Standing Anatomical Position with Scapular Retraction and Depression at Wall  - 1 x daily - 7 x weekly - 1 sets - 5 reps - 8 hold ?- Seated Isometric Cervical Rotation  - 1 x daily - 7 x weekly - 1 sets - 5 reps - 8 hold ?- Seated Isometric Cervical Sidebending  - 1 x daily - 7 x weekly - 1 sets - 5 reps - 8 hold ?- Seated Isometric Cervical Flexion  - 1 x daily - 7 x weekly - 1 sets - 5 reps - 8 hold ?- Seated Isometric Cervical Extension  - 1 x daily - 7 x weekly - 1 sets - 5 reps - 8 hold ?  ?ASSESSMENT: ?  ?CLINICAL IMPRESSION: ?The patient reports a good overall response to DN and is receptive to continuing to address myofascial issues.  Exercises performed in supine today with weights to allow for decreased strain on cervical region during strengthening.  Added PNF D2 for functional strengthening of bilateral UE.  Pt continues to require skilled PT to progress towards goal related activities. ?  ?  ?OBJECTIVE  IMPAIRMENTS decreased ROM, decreased strength, dizziness, increased muscle spasms, impaired UE functional use, and pain.  ?  ?ACTIVITY LIMITATIONS cleaning and community activity.  ?  ?PERSONAL FACTORS 1 comorbidity: Hx of cer

## 2021-05-09 DIAGNOSIS — F411 Generalized anxiety disorder: Secondary | ICD-10-CM | POA: Diagnosis not present

## 2021-05-09 DIAGNOSIS — F331 Major depressive disorder, recurrent, moderate: Secondary | ICD-10-CM | POA: Diagnosis not present

## 2021-05-10 ENCOUNTER — Ambulatory Visit: Payer: BC Managed Care – PPO | Admitting: Rehabilitative and Restorative Service Providers"

## 2021-05-10 ENCOUNTER — Encounter: Payer: Self-pay | Admitting: Rehabilitative and Restorative Service Providers"

## 2021-05-10 DIAGNOSIS — R252 Cramp and spasm: Secondary | ICD-10-CM

## 2021-05-10 DIAGNOSIS — M542 Cervicalgia: Secondary | ICD-10-CM | POA: Diagnosis not present

## 2021-05-10 DIAGNOSIS — M6281 Muscle weakness (generalized): Secondary | ICD-10-CM | POA: Diagnosis not present

## 2021-05-10 NOTE — Therapy (Signed)
?OUTPATIENT PHYSICAL THERAPY TREATMENT NOTE ? ? ?Patient Name: Henry Jennings ?MRN: [id number] ?DOB:11-Dec-1970, 51 y.o., male ?Today's Date: 05/10/2021 ? ?PCP: No primary care provider on file. ?REFERRING PROVIDER: Kerrin Champagne, MD ? ? PT End of Session - 05/10/21 1106   ? ? Visit Number 9   ? Date for PT Re-Evaluation 05/27/21   ? Authorization Type BC/BS   ? PT Start Time 1104   ? PT Stop Time 1142   ? PT Time Calculation (min) 38 min   ? Activity Tolerance Patient tolerated treatment well   ? Behavior During Therapy [facility] for tasks assessed/performed   ? ?  ?  ? ?  ? ? ?Past Medical History:  ?Diagnosis Date  ? Depression   ? ?History reviewed. No pertinent surgical history. ?There are no problems to display for this patient. ? ? ?REFERRING DIAG: M54.2 (ICD-10-CM) - Cervicalgia M47.812 (ICD-10-CM) - Spondylosis without myelopathy or radiculopathy, cervical region M75.41 (ICD-10-CM) - Impingement syndrome of right shoulder M75.42 (ICD-10-CM) - Impingement syndrome of left shoulder   ? ?THERAPY DIAG:  ?Cervicalgia ? ?Cramp and spasm ? ?Muscle weakness (generalized) ? ?PERTINENT HISTORY: Fibromyalgia, ongoing dizziness going to be worked up at neurologist ? ?PRECAUTIONS: n/a ? ?ONSET DATE: 03/25/2021   ? ?SUBJECTIVE: Pt reports that he did a lot over the weekend and had some increased pain, but is starting to feel better.  Pt was able to do work for approximately an hour or two before he needed to stop due to pain. ? ?PAIN:  ?Are you having pain? Yes: NPRS scale: 5/10 ?Pain location: cervical into bilat shoulders ?Pain description: radiating down arms ?Aggravating factors: worse in the AM and with activity ?Relieving factors: CBD oil ? ?OBJECTIVE:  ?  ?DIAGNOSTIC FINDINGS:  ?03/25/2021 Cervical Radiograph:  AP and lateral radiographs in flexion and extension of the cervical spine demonstrate disc narrowing C4-5  ?C5-6, C6-7 and C7-T1. Spondylosis changes bilateral uncovertebral joints  ?C5-6, C6-7and C7-T1. Loss of  normal Cervical lordotic curve due to anterior column shortening.  ?  ?PATIENT SURVEYS:  ?FOTO 38% at initial evaluation (projected of 55% by visit 13)  ?  ?SENSATION: ?03/31/21: Light touch: Impaired Pt reports impaired sensation on the right side ?  ?POSTURE:  ?03/31/21: ?Forward head, rounded shoulders ?  ?PALPATION: ?03/31/21: ?Tender to palpation along cervical paraspinals.  Muscle spasms noted upper trap and cervical multifidi.    ?  ?CERVICAL ROM:  ?  ?Active ROM A/PROM (deg) ?03/31/2021 A/ROM ?(Deg) ?05/03/2021  ?Flexion 25 50  ?Extension 5 10  ?Right lateral flexion 25 25  ?Left lateral flexion 5 20  ?Right rotation 28 40  ?Left rotation 22 30  ? (Blank rows = not tested) ?  ?UE ROM: ?  ?Active ROM Right ?03/31/2021 Left ?03/31/2021  ?Shoulder flexion 99 155  ?Shoulder extension      ?Shoulder abduction 112 145  ?Shoulder adduction      ?Shoulder extension      ?Shoulder internal rotation      ?Shoulder external rotation      ?Elbow flexion      ?Elbow extension      ?Wrist flexion      ?Wrist extension      ?Wrist ulnar deviation      ?Wrist radial deviation      ?Wrist pronation      ?Wrist supination      ? (Blank rows = not tested) ?  ?UE MMT: ?  ?MMT Right ?03/31/2021 Left ?  03/31/2021  ?Shoulder flexion 3 4  ?Shoulder extension      ?Shoulder abduction 3 4  ?Shoulder adduction      ?Shoulder extension      ?Shoulder internal rotation      ?Shoulder external rotation      ?Middle trapezius      ?Lower trapezius      ?Elbow flexion      ?Elbow extension      ?Wrist flexion      ?Wrist extension      ?Wrist ulnar deviation      ?Wrist radial deviation      ?Wrist pronation      ?Wrist supination      ?Grip strength 45 60  ? (Blank rows = not tested) ?  ?FUNCTIONAL TESTS:  ?03/31/21: 5 times sit to stand: 22 sec without UE.  However, pt states that he is going slow secondary dizziness ?  ?  ?TODAY'S TREATMENT:   ? ?05/10/2021: ?Prayer stretch 5 x 15 sec ?Bilat median nerve stretch 5 x15 sec ?UBE level 1.0 x3 min each  way with PT present to discuss status ?Lat pull 25# 2x10 ?Seated:  chin tuck into ball, thoracic extension with ball behind back, shoulder ER with yellow tband, horizontal abduction with yellow tband.  2x10 each ?Shoulder flexion and shoulder abduction with yellow tband.  2x10 each bilat ?Manual therapy with soft tissue mobilization to right wrist flexors and extensors and thenar eminance for tissue elongation. ? ?05/05/2021: ?UBE level 1.0 x2 min each way with PT present to discuss status ?Supine:  chin tucks into pillow, shoulder press, serratus punch with 2#, shoulder flexion with 2#, chest press with 2#.  BUE 2x10 each ?Supine UE D2 with red tband 2x10 bilat ?Manual therapy with soft tissue mobilization and manual trigger point release to cervical paraspinals, R upper trap, R wrist extensors, R biceps/triceps. ? ?05/03/2021: ?UBE level 1.0 x2 min each way with PT present to discuss status ?Seated upper trap and levator stretch x15 sec bilat ?Seated with red tband:  shoulder ER, horizontal abduction, rows.  2x10 bilat ?3 way scapular stabilization on wall with green loop.  X5 bilat ?Standing cervical retraction with head to wall 2x10 ?Lat pull 25# 2x10 ?Wall push ups 2x10 ?Doorway pec stretch 2x15 sec, Deltoid stretch 2x15 sec bilat, 90 degree counter stretch at barre 2x20 sec ?DRY NEEDLING: ?Dry needling consent given? yes ?Educational handouts provided? yes ?Muscles treated: bilat cervical and thoracic multifidi, bilat upper trap, R delts, R triceps, R wrist extensors ?Response from dry needling: twitch response and muscle elongation. Utilized skilled palpation to identify trigger points ? ?04/29/21: ? ?Trigger Point Dry-Needling  ?Treatment instructions: Expect mild to moderate muscle soreness. S/S of pneumothorax if dry needled over a lung field, and to seek immediate medical attention should they occur. Patient verbalized understanding of these instructions and education. ? ?Patient Consent Given: Yes ?Education  handout provided: Previously provided ?Muscles treated: bil upper traps, Rt mutlifidi C7-T2, Rt infraspinatus, Rt teres; right wrist extensors ?Electrical stimulation performed: No ?Parameters: N/A ?Treatment response/outcome: twitch/elongation, improved cervical SB and Rot with less pain, improved ROM end of session ? ? STM to bil upper traps, cervical paraspinals, periscapular muscles and right forearm extensors ?  ?Self care regarding management of fibromyalgia discussion of basic nutrition implications, self prioritization, graded exercise; value of isometrics ?Cervical isometrics and shoulder extension isometrics per HEP ? ?Verbal review of previous HEP:  Supine chin nods with extension isometric into pillow 10x5" holds ?  Doorway chest stretch 5x5" holds ?Standing levator stretch with light overpressue x 10" each to tolerance ? ? ?  ?PATIENT EDUCATION:  ?Education details: Issued HEP ?Person educated: Patient ?Education method: Explanation, Demonstration, and Handouts ?Education comprehension: verbalized understanding and returned demonstration ?  ?  ?HOME EXERCISE PROGRAM: ? ?Access Code: [email or web address] ?URL: [email or web address] ?Date: 04/29/2021 ?Prepared by: Lavinia Sharps ? ?Exercises ?- Seated Upper Trapezius Stretch  - 1 x daily - 7 x weekly - 1 sets - 2 reps - 20 sec hold ?- Seated Cervical Retraction  - 2 x daily - 7 x weekly - 2 sets - 10 reps ?- Seated Scapular Retraction  - 1 x daily - 7 x weekly - 2 sets - 10 reps ?- Seated Shoulder Flexion Towel Slide at Table Top  - 1 x daily - 7 x weekly - 2 sets - 10 reps ?- Supine Chin Tuck  - 1 x daily - 7 x weekly - 2 sets - 10 reps ?- Standing Anatomical Position with Scapular Retraction and Depression at Wall  - 1 x daily - 7 x weekly - 1 sets - 5 reps - 8 hold ?- Seated Isometric Cervical Rotation  - 1 x daily - 7 x weekly - 1 sets - 5 reps - 8 hold ?- Seated Isometric Cervical Sidebending  - 1 x daily - 7 x weekly - 1 sets - 5 reps - 8 hold ?-  Seated Isometric Cervical Flexion  - 1 x daily - 7 x weekly - 1 sets - 5 reps - 8 hold ?- Seated Isometric Cervical Extension  - 1 x daily - 7 x weekly - 1 sets - 5 reps - 8 hold ?  ?ASSESSMENT: ?  ?CLINICAL

## 2021-05-12 ENCOUNTER — Ambulatory Visit: Payer: BC Managed Care – PPO | Admitting: Rehabilitative and Restorative Service Providers"

## 2021-05-12 ENCOUNTER — Encounter: Payer: Self-pay | Admitting: Rehabilitative and Restorative Service Providers"

## 2021-05-12 DIAGNOSIS — R252 Cramp and spasm: Secondary | ICD-10-CM

## 2021-05-12 DIAGNOSIS — M6281 Muscle weakness (generalized): Secondary | ICD-10-CM | POA: Diagnosis not present

## 2021-05-12 DIAGNOSIS — M542 Cervicalgia: Secondary | ICD-10-CM | POA: Diagnosis not present

## 2021-05-12 NOTE — Therapy (Signed)
?OUTPATIENT PHYSICAL THERAPY TREATMENT NOTE ? ? ?Patient Name: Henry Jennings ?MRN: [id number] ?DOB:Oct 16, 1970, 51 y.o., male ?Today's Date: 05/12/2021 ? ?PCP: No primary care provider on file. ?REFERRING PROVIDER: Kerrin Champagne, MD ? ?Progress Note ?Reporting Period 03/31/2021 to 05/12/2021 ? ?See note below for Objective Data and Assessment of Progress/Goals.  ? ?  ? ? PT End of Session - 05/12/21 1103   ? ? Visit Number 10   ? Date for PT Re-Evaluation 05/27/21   ? Authorization Type BC/BS   ? PT Start Time 1100   ? PT Stop Time 1140   ? PT Time Calculation (min) 40 min   ? Activity Tolerance Patient tolerated treatment well   ? Behavior During Therapy [facility] for tasks assessed/performed   ? ?  ?  ? ?  ? ? ?Past Medical History:  ?Diagnosis Date  ? Depression   ? ?History reviewed. No pertinent surgical history. ?There are no problems to display for this patient. ? ? ?REFERRING DIAG: M54.2 (ICD-10-CM) - Cervicalgia M47.812 (ICD-10-CM) - Spondylosis without myelopathy or radiculopathy, cervical region M75.41 (ICD-10-CM) - Impingement syndrome of right shoulder M75.42 (ICD-10-CM) - Impingement syndrome of left shoulder   ? ?THERAPY DIAG:  ?Cervicalgia ? ?Cramp and spasm ? ?Muscle weakness (generalized) ? ?PERTINENT HISTORY: Fibromyalgia, ongoing dizziness going to be worked up at neurologist ? ?PRECAUTIONS: n/a ? ?ONSET DATE: 03/25/2021   ? ?SUBJECTIVE: Pt reports that he is having more pain this morning, especially if he moves his wrist to end range. ? ?PAIN:  ?Are you having pain? Yes: NPRS scale: 7/10 ?Pain location: cervical into bilat shoulders ?Pain description: radiating down arms ?Aggravating factors: worse in the AM and with activity ?Relieving factors: CBD oil ? ?OBJECTIVE:  ?  ?DIAGNOSTIC FINDINGS:  ?03/25/2021 Cervical Radiograph:  AP and lateral radiographs in flexion and extension of the cervical spine demonstrate disc narrowing C4-5  ?C5-6, C6-7 and C7-T1. Spondylosis changes bilateral uncovertebral joints   ?C5-6, C6-7and C7-T1. Loss of normal Cervical lordotic curve due to anterior column shortening.  ?  ?PATIENT SURVEYS:  ?FOTO 38% at initial evaluation (projected of 55% by visit 13)  ?05/12/2021:  FOTO 37% ?  ?SENSATION: ?03/31/21: Light touch: Impaired Pt reports impaired sensation on the right side ?  ?POSTURE:  ?03/31/21: ?Forward head, rounded shoulders ?  ?PALPATION: ?03/31/21: ?Tender to palpation along cervical paraspinals.  Muscle spasms noted upper trap and cervical multifidi.    ?  ?CERVICAL ROM:  ?  ?Active ROM A/PROM (deg) ?03/31/2021 A/ROM ?(Deg) ?05/03/2021  ?Flexion 25 50  ?Extension 5 10  ?Right lateral flexion 25 25  ?Left lateral flexion 5 20  ?Right rotation 28 40  ?Left rotation 22 30  ? (Blank rows = not tested) ?  ?UE ROM: ?  ?Active ROM Right ?03/31/2021 Right ?05/12/2021 Left ?03/31/2021  ?Shoulder flexion 99 136 155  ?Shoulder extension       ?Shoulder abduction 112 125 145  ?Shoulder adduction       ?Shoulder extension       ?Shoulder internal rotation       ?Shoulder external rotation       ?Elbow flexion       ?Elbow extension       ?Wrist flexion       ?Wrist extension       ?Wrist ulnar deviation       ?Wrist radial deviation       ?Wrist pronation       ?Wrist supination       ? (  Blank rows = not tested) ?  ?UE MMT: ?  ?MMT Right ?03/31/2021 Left ?03/31/2021  ?Shoulder flexion 3 4  ?Shoulder extension      ?Shoulder abduction 3 4  ?Shoulder adduction      ?Shoulder extension      ?Shoulder internal rotation      ?Shoulder external rotation      ?Middle trapezius      ?Lower trapezius      ?Elbow flexion      ?Elbow extension      ?Wrist flexion      ?Wrist extension      ?Wrist ulnar deviation      ?Wrist radial deviation      ?Wrist pronation      ?Wrist supination      ?Grip strength 45 60  ? (Blank rows = not tested) ?  ?FUNCTIONAL TESTS:  ?03/31/21: 5 times sit to stand: 22 sec without UE.  However, pt states that he is going slow secondary dizziness ?05/12/2021:  5 times sit to stand: 16.4 sec  without UE ?  ?  ?TODAY'S TREATMENT:   ? ?05/12/2021: ?UBE level 1.0 x3 min each way with PT present to discuss status ?Bilat median nerve stretch 5 x15 sec ?Seated:  chin tuck, thoracic extension with ball behind back, shoulder ER with yellow tband, horizontal abduction with yellow tband.  2x10 each ?Cat cow x10 ?Shoulder flexion and shoulder abduction with yellow tband.  x10 each bilat ?DRY NEEDLING: ?Dry needling consent given? yes ?Educational handouts provided? yes ?Muscles treated: right cervical and thoracic multifidi, right upper trap ?Response from dry needling: twitch response and muscle elongation. Utilized skilled palpation to identify trigger points ? ?05/10/2021: ?Prayer stretch 5 x 15 sec ?Bilat median nerve stretch 5 x15 sec ?UBE level 1.0 x3 min each way with PT present to discuss status ?Lat pull 25# 2x10 ?Seated:  chin tuck into ball, thoracic extension with ball behind back, shoulder ER with yellow tband, horizontal abduction with yellow tband.  2x10 each ?Shoulder flexion and shoulder abduction with yellow tband.  2x10 each bilat ?Manual therapy with soft tissue mobilization to right wrist flexors and extensors and thenar eminance for tissue elongation. ? ?05/05/2021: ?UBE level 1.0 x2 min each way with PT present to discuss status ?Supine:  chin tucks into pillow, shoulder press, serratus punch with 2#, shoulder flexion with 2#, chest press with 2#.  BUE 2x10 each ?Supine UE D2 with red tband 2x10 bilat ?Manual therapy with soft tissue mobilization and manual trigger point release to cervical paraspinals, R upper trap, R wrist extensors, R biceps/triceps. ? ?05/03/2021: ?UBE level 1.0 x2 min each way with PT present to discuss status ?Seated upper trap and levator stretch x15 sec bilat ?Seated with red tband:  shoulder ER, horizontal abduction, rows.  2x10 bilat ?3 way scapular stabilization on wall with green loop.  X5 bilat ?Standing cervical retraction with head to wall 2x10 ?Lat pull 25#  2x10 ?Wall push ups 2x10 ?Doorway pec stretch 2x15 sec, Deltoid stretch 2x15 sec bilat, 90 degree counter stretch at barre 2x20 sec ?DRY NEEDLING: ?Dry needling consent given? yes ?Educational handouts provided? yes ?Muscles treated: bilat cervical and thoracic multifidi, bilat upper trap, R delts, R triceps, R wrist extensors ?Response from dry needling: twitch response and muscle elongation. Utilized skilled palpation to identify trigger points ? ?04/29/21: ? ?Trigger Point Dry-Needling  ?Treatment instructions: Expect mild to moderate muscle soreness. S/S of pneumothorax if dry needled over a lung field, and  to seek immediate medical attention should they occur. Patient verbalized understanding of these instructions and education. ? ?Patient Consent Given: Yes ?Education handout provided: Previously provided ?Muscles treated: bil upper traps, Rt mutlifidi C7-T2, Rt infraspinatus, Rt teres; right wrist extensors ?Electrical stimulation performed: No ?Parameters: N/A ?Treatment response/outcome: twitch/elongation, improved cervical SB and Rot with less pain, improved ROM end of session ? ? STM to bil upper traps, cervical paraspinals, periscapular muscles and right forearm extensors ?  ?Self care regarding management of fibromyalgia discussion of basic nutrition implications, self prioritization, graded exercise; value of isometrics ?Cervical isometrics and shoulder extension isometrics per HEP ? ?Verbal review of previous HEP:  Supine chin nods with extension isometric into pillow 10x5" holds ?Doorway chest stretch 5x5" holds ?Standing levator stretch with light overpressue x 10" each to tolerance ? ? ?  ?PATIENT EDUCATION:  ?Education details: Issued HEP ?Person educated: Patient ?Education method: Explanation, Demonstration, and Handouts ?Education comprehension: verbalized understanding and returned demonstration ?  ?  ?HOME EXERCISE PROGRAM: ? ?Access Code: [email or web address] ?URL:  [email or web address] ?Date: 04/29/2021 ?Prepared by: Lavinia Sharps ? ?Exercises ?- Seated Upper Trapezius Stretch  - 1 x daily - 7 x weekly - 1 sets - 2 reps - 20 sec hold ?- Seated Cervical Retraction  - 2 x daily - 7 x weekly - 2

## 2021-05-16 ENCOUNTER — Encounter: Payer: Self-pay | Admitting: Specialist

## 2021-05-16 ENCOUNTER — Ambulatory Visit (INDEPENDENT_AMBULATORY_CARE_PROVIDER_SITE_OTHER): Payer: BC Managed Care – PPO | Admitting: Specialist

## 2021-05-16 ENCOUNTER — Ambulatory Visit: Payer: Self-pay

## 2021-05-16 VITALS — BP 120/85 | HR 86 | Ht 65.5 in | Wt 132.0 lb

## 2021-05-16 DIAGNOSIS — F331 Major depressive disorder, recurrent, moderate: Secondary | ICD-10-CM | POA: Diagnosis not present

## 2021-05-16 DIAGNOSIS — M7542 Impingement syndrome of left shoulder: Secondary | ICD-10-CM

## 2021-05-16 DIAGNOSIS — F411 Generalized anxiety disorder: Secondary | ICD-10-CM | POA: Diagnosis not present

## 2021-05-16 DIAGNOSIS — M4722 Other spondylosis with radiculopathy, cervical region: Secondary | ICD-10-CM

## 2021-05-16 DIAGNOSIS — M79641 Pain in right hand: Secondary | ICD-10-CM

## 2021-05-16 DIAGNOSIS — G5620 Lesion of ulnar nerve, unspecified upper limb: Secondary | ICD-10-CM | POA: Diagnosis not present

## 2021-05-16 DIAGNOSIS — R200 Anesthesia of skin: Secondary | ICD-10-CM | POA: Diagnosis not present

## 2021-05-16 DIAGNOSIS — M542 Cervicalgia: Secondary | ICD-10-CM

## 2021-05-16 DIAGNOSIS — M7541 Impingement syndrome of right shoulder: Secondary | ICD-10-CM

## 2021-05-16 DIAGNOSIS — R202 Paresthesia of skin: Secondary | ICD-10-CM

## 2021-05-16 DIAGNOSIS — R258 Other abnormal involuntary movements: Secondary | ICD-10-CM

## 2021-05-16 DIAGNOSIS — M4802 Spinal stenosis, cervical region: Secondary | ICD-10-CM

## 2021-05-16 DIAGNOSIS — M5030 Other cervical disc degeneration, unspecified cervical region: Secondary | ICD-10-CM

## 2021-05-16 DIAGNOSIS — R292 Abnormal reflex: Secondary | ICD-10-CM

## 2021-05-16 MED ORDER — GABAPENTIN 100 MG PO CAPS: 200.0000 mg | ORAL_CAPSULE | Freq: Two times a day (BID) | ORAL | 1 refills | Status: DC

## 2021-05-16 NOTE — Patient Instructions (Signed)
Avoid overhead lifting and overhead use of the arms. ?Do not lift greater than 5 lbs. ?Adjust head rest in vehicle to prevent hyperextension if rear ended. ?Take extra precautions to avoid falling. ?See Neurology for assessment and EMG/NCV of the right arm to assess for cervical radiculopathy vs peripheral nerve compressive  ?Neuropathy. ?MRI of the cervical spine to assess for cord compression or cord signal changes ?Increase gabapentin to 200 mg BID.  ?

## 2021-05-16 NOTE — Progress Notes (Signed)
? ?Office Visit Note ?  ?Patient: Henry Jennings           ?Date of Birth: 22-Feb-1970           ?MRN: [id number] ?Visit Date: 05/16/2021 ?             ?Requested by: No referring provider defined for this encounter. ?PCP: No primary care provider on file. ? ? ?Assessment & Plan: ?Visit Diagnoses:  ?1. Pain of right hand   ?2. Bilateral arm numbness and tingling while sleeping   ?3. Cervicalgia   ?4. Degenerative disc disease, cervical   ?5. Cubital tunnel syndrome, unspecified laterality   ?6. Impingement syndrome of right shoulder   ?7. Impingement syndrome of left shoulder   ?8. Hyperreflexia of lower extremity   ?9. Clonus   ? ? ?Plan: Avoid overhead lifting and overhead use of the arms. ?Do not lift greater than 5 lbs. ?Adjust head rest in vehicle to prevent hyperextension if rear ended. ?Take extra precautions to avoid falling. ?See Neurology for assessment and EMG/NCV of the right arm to assess for cervical radiculopathy vs peripheral nerve compressive  ?Neuropathy. ?MRI of the cervical spine to assess for cord compression or cord signal changes ?Increase gabapentin to 200 mg BID.  ? ?Follow-Up Instructions: Return in about 3 weeks (around 06/06/2021).  ? ?Orders:  ?Orders Placed This Encounter  ?Procedures  ? XR Hand Complete Right  ? ?No orders of the defined types were placed in this encounter. ? ? ? ? Procedures: ?No procedures performed ? ? ?Clinical Data: ?No additional findings. ? ? ?Subjective: ?Chief Complaint  ?Patient presents with  ? Neck - Follow-up  ? Right Hand - Pain  ? ? ?51 year old male right hand dominant with pain into the shoulder right arm greater than left with pain into both thighs. He is going to PT and is taking gabapentin and it seems to help. Dry needling of the right arm is helping as well, tiny bit of occasional nausea and dizziness and some times a feeling of crawling or warm sensation between his ears and down his neck. ?CBD is not of help. He is helping his mother post extension  of cervical fusion superiorly and inferiorly for central cervical stenosis with cord changes.  ? ? ?Review of Systems  ?Constitutional: Negative.  Negative for activity change, appetite change, chills, diaphoresis, fatigue, fever and unexpected weight change.  ?HENT: Negative.    ?Eyes: Negative.   ?Respiratory: Negative.    ?Cardiovascular: Negative.   ?Gastrointestinal: Negative.   ?Endocrine: Negative.   ?Genitourinary: Negative.   ?Musculoskeletal: Negative.   ?Skin: Negative.   ?Allergic/Immunologic: Negative.   ?Neurological: Negative.   ?Hematological: Negative.   ?Psychiatric/Behavioral: Negative.    ? ? ?Objective: ?Vital Signs: BP 120/85 (BP Location: Left Arm, Patient Position: Sitting)   Pulse 86   Ht 5' 5.5" (1.664 m)   Wt 132 lb (59.9 kg)   BMI 21.63 kg/m?  ? ?Physical Exam ? ?Ortho Exam ? ?Specialty Comments:  ?No specialty comments available. ? ?Imaging: ?No results found. ? ? ?PMFS History: ?There are no problems to display for this patient. ? ?Past Medical History:  ?Diagnosis Date  ? Depression   ?  ?No family history on file.  ?No past surgical history on file. ?Social History  ? ?Occupational History  ? Not on file  ?Tobacco Use  ? Smoking status: Never  ? Smokeless tobacco: Never  ?Substance and Sexual Activity  ? Alcohol use: Not  on file  ? Drug use: Not on file  ? Sexual activity: Not on file  ? ? ? ? ? ? ?

## 2021-05-17 ENCOUNTER — Ambulatory Visit: Payer: BC Managed Care – PPO | Admitting: Rehabilitative and Restorative Service Providers"

## 2021-05-17 ENCOUNTER — Encounter: Payer: Self-pay | Admitting: Rehabilitative and Restorative Service Providers"

## 2021-05-17 DIAGNOSIS — M6281 Muscle weakness (generalized): Secondary | ICD-10-CM

## 2021-05-17 DIAGNOSIS — R252 Cramp and spasm: Secondary | ICD-10-CM | POA: Diagnosis not present

## 2021-05-17 DIAGNOSIS — M542 Cervicalgia: Secondary | ICD-10-CM | POA: Diagnosis not present

## 2021-05-17 NOTE — Therapy (Signed)
?OUTPATIENT PHYSICAL THERAPY TREATMENT NOTE ? ? ?Patient Name: Henry Jennings ?MRN: [id number] ?DOB:Mar 29, 1970, 51 y.o., male ?Today's Date: 05/17/2021 ? ?PCP: No primary care provider on file. ?REFERRING PROVIDER: Kerrin Champagne, MD ? ?Progress Note ?Reporting Period 03/31/2021 to 05/12/2021 ? ?See note below for Objective Data and Assessment of Progress/Goals.  ? ?  ? ? PT End of Session - 05/17/21 1114   ? ? Visit Number 11   ? Date for PT Re-Evaluation 05/27/21   ? Authorization Type BC/BS   ? PT Start Time 1106   ? PT Stop Time 1145   ? PT Time Calculation (min) 39 min   ? Activity Tolerance Patient tolerated treatment well   ? Behavior During Therapy [facility] for tasks assessed/performed   ? ?  ?  ? ?  ? ? ?Past Medical History:  ?Diagnosis Date  ? Depression   ? ?History reviewed. No pertinent surgical history. ?There are no problems to display for this patient. ? ? ?REFERRING DIAG: M54.2 (ICD-10-CM) - Cervicalgia M47.812 (ICD-10-CM) - Spondylosis without myelopathy or radiculopathy, cervical region M75.41 (ICD-10-CM) - Impingement syndrome of right shoulder M75.42 (ICD-10-CM) - Impingement syndrome of left shoulder   ? ?THERAPY DIAG:  ?Cervicalgia ? ?Cramp and spasm ? ?Muscle weakness (generalized) ? ?PERTINENT HISTORY: Fibromyalgia, ongoing dizziness going to be worked up at neurologist ? ?PRECAUTIONS: n/a ? ?ONSET DATE: 03/25/2021   ? ?SUBJECTIVE: Pt reports that he is having some stress and increased tightness today, but reports that he has been doing well since last visit and has had multiple days at only a 4/10 pain. ? ?PAIN:  ?Are you having pain? Yes: NPRS scale: 5/10 ?Pain location: cervical into bilat shoulders ?Pain description: radiating down arms ?Aggravating factors: worse in the AM and with activity ?Relieving factors: CBD oil ? ?OBJECTIVE:  ?  ?DIAGNOSTIC FINDINGS:  ?03/25/2021 Cervical Radiograph:  AP and lateral radiographs in flexion and extension of the cervical spine demonstrate disc narrowing C4-5   ?C5-6, C6-7 and C7-T1. Spondylosis changes bilateral uncovertebral joints  ?C5-6, C6-7and C7-T1. Loss of normal Cervical lordotic curve due to anterior column shortening.  ?  ?PATIENT SURVEYS:  ?FOTO 38% at initial evaluation (projected of 55% by visit 13)  ?05/12/2021:  FOTO 37% ?  ?SENSATION: ?03/31/21: Light touch: Impaired Pt reports impaired sensation on the right side ?  ?POSTURE:  ?03/31/21: ?Forward head, rounded shoulders ?  ?PALPATION: ?03/31/21: ?Tender to palpation along cervical paraspinals.  Muscle spasms noted upper trap and cervical multifidi.    ?  ?CERVICAL ROM:  ?  ?Active ROM A/PROM (deg) ?03/31/2021 A/ROM ?(Deg) ?05/03/2021  ?Flexion 25 50  ?Extension 5 10  ?Right lateral flexion 25 25  ?Left lateral flexion 5 20  ?Right rotation 28 40  ?Left rotation 22 30  ? (Blank rows = not tested) ?  ?UE ROM: ?  ?Active ROM Right ?03/31/2021 Right ?05/12/2021 Left ?03/31/2021  ?Shoulder flexion 99 136 155  ?Shoulder extension       ?Shoulder abduction 112 125 145  ?Shoulder adduction       ?Shoulder extension       ?Shoulder internal rotation       ?Shoulder external rotation       ?Elbow flexion       ?Elbow extension       ?Wrist flexion       ?Wrist extension       ?Wrist ulnar deviation       ?Wrist radial deviation       ?  Wrist pronation       ?Wrist supination       ? (Blank rows = not tested) ?  ?UE MMT: ?  ?MMT Right ?03/31/2021 Left ?03/31/2021  ?Shoulder flexion 3 4  ?Shoulder extension      ?Shoulder abduction 3 4  ?Shoulder adduction      ?Shoulder extension      ?Shoulder internal rotation      ?Shoulder external rotation      ?Middle trapezius      ?Lower trapezius      ?Elbow flexion      ?Elbow extension      ?Wrist flexion      ?Wrist extension      ?Wrist ulnar deviation      ?Wrist radial deviation      ?Wrist pronation      ?Wrist supination      ?Grip strength 45 60  ? (Blank rows = not tested) ?  ?FUNCTIONAL TESTS:  ?03/31/21: 5 times sit to stand: 22 sec without UE.  However, pt states that he is  going slow secondary dizziness ?05/12/2021:  5 times sit to stand: 16.4 sec without UE ?  ?  ?TODAY'S TREATMENT:   ? ?05/17/2021: ?UBE level 1.0 x3 min each way with PT present to discuss status ?Cat cow x10 ?Seated:  chin tuck, thoracic extension with ball behind back, shoulder ER with yellow tband, horizontal abduction with yellow tband.  2x10 each ?Shoulder flexion and shoulder abduction with yellow tband.  2x10 each bilat ?Radian nerve glide 2x10 ?Wall angel 2x10 ?Standing rows with 5# 2x10 ? ?05/12/2021: ?UBE level 1.0 x3 min each way with PT present to discuss status ?Bilat median nerve stretch 5 x15 sec ?Seated:  chin tuck, thoracic extension with ball behind back, shoulder ER with yellow tband, horizontal abduction with yellow tband.  2x10 each ?Cat cow x15 ?Shoulder flexion and shoulder abduction with yellow tband.  x10 each bilat ?DRY NEEDLING: ?Dry needling consent given? yes ?Educational handouts provided? yes ?Muscles treated: right cervical and thoracic multifidi, right upper trap ?Response from dry needling: twitch response and muscle elongation. Utilized skilled palpation to identify trigger points ? ?05/10/2021: ?Prayer stretch 5 x 15 sec ?Bilat median nerve stretch 5 x15 sec ?UBE level 1.0 x3 min each way with PT present to discuss status ?Lat pull 25# 2x10 ?Seated:  chin tuck into ball, thoracic extension with ball behind back, shoulder ER with yellow tband, horizontal abduction with yellow tband.  2x10 each ?Shoulder flexion and shoulder abduction with yellow tband.  2x10 each bilat ?Manual therapy with soft tissue mobilization to right wrist flexors and extensors and thenar eminance for tissue elongation. ? ? ?  ?PATIENT EDUCATION:  ?Education details: Issued HEP ?Person educated: Patient ?Education method: Explanation, Demonstration, and Handouts ?Education comprehension: verbalized understanding and returned demonstration ?  ?  ?HOME EXERCISE PROGRAM: ?Access Code: [email or web address] ?URL:  [email or web address] ?Date: 05/17/2021 ?Prepared by: Reather Laurence ? ?Exercises ?- Seated Upper Trapezius Stretch  - 1 x daily - 7 x weekly - 1 sets - 2 reps - 20 sec hold ?- Seated Cervical Retraction  - 2 x daily - 7 x weekly - 2 sets - 10 reps ?- Seated Scapular Retraction  - 1 x daily - 7 x weekly - 2 sets - 10 reps ?- Seated Shoulder Flexion Towel Slide at Table Top  - 1 x daily - 7 x weekly - 2 sets - 10 reps ?- Standing Anatomical Position with Scapular Retraction  and Depression at Wall  - 1 x daily - 7 x weekly - 1 sets - 5 reps - 8 hold ?- Seated Isometric Cervical Rotation  - 1 x daily - 7 x weekly - 1 sets - 5 reps - 8 hold ?- Seated Isometric Cervical Sidebending  - 1 x daily - 7 x weekly - 1 sets - 5 reps - 8 hold ?- Seated Isometric Cervical Flexion  - 1 x daily - 7 x weekly - 1 sets - 5 reps - 8 hold ?- Seated Isometric Cervical Extension  - 1 x daily - 7 x weekly - 1 sets - 5 reps - 8 hold ?- Radial Nerve Iritable Nerve Glide  - 1 x daily - 7 x weekly - 2 sets - 10 reps ?- Radial Nerve Flossing  - 1 x daily - 7 x weekly - 2 sets - 10 reps ?- Shoulder External Rotation and Scapular Retraction with Resistance  - 1 x daily - 7 x weekly - 2 sets - 10 reps ?- Standing Shoulder Horizontal Abduction with Resistance  - 1 x daily - 7 x weekly - 2 sets - 10 reps ?- Wall Angels  - 1 x daily - 7 x weekly - 2 sets - 10 reps ? ?  ?ASSESSMENT: ?  ?CLINICAL IMPRESSION: ?Mr Hovan presents reporting overall improvements and decreased pain since last session.  Pt reporting at this time that he can feel that the exercises are working and he is getting stronger.  Pt with decreased pain and reports feeling looser following session.  HEP updated to include radial nerve stretches, as pt reports that Dr Otelia Sergeant believes that he may be having some radial nerve tightness.  Pt continues to require skilled PT to progress towards goal related activities. ?  ?  ?OBJECTIVE IMPAIRMENTS decreased ROM, decreased  strength, dizziness, increased muscle spasms, impaired UE functional use, and pain.  ?  ?ACTIVITY LIMITATIONS cleaning and community activity.  ?  ?PERSONAL FACTORS 1 comorbidity: Hx of cervicalgia with radiculopathy  are al

## 2021-05-19 ENCOUNTER — Ambulatory Visit: Payer: BC Managed Care – PPO | Admitting: Physical Therapy

## 2021-05-19 ENCOUNTER — Encounter: Payer: Self-pay | Admitting: Physical Therapy

## 2021-05-19 DIAGNOSIS — M542 Cervicalgia: Secondary | ICD-10-CM | POA: Diagnosis not present

## 2021-05-19 DIAGNOSIS — M6281 Muscle weakness (generalized): Secondary | ICD-10-CM | POA: Diagnosis not present

## 2021-05-19 DIAGNOSIS — R252 Cramp and spasm: Secondary | ICD-10-CM | POA: Diagnosis not present

## 2021-05-19 NOTE — Therapy (Signed)
OUTPATIENT PHYSICAL THERAPY TREATMENT NOTE   Patient Name: Henry Jennings MRN: [id number] DOB:Jul 03, 1970, 51 y.o., male Today's Date: 05/19/2021  PCP: No primary care provider on file. REFERRING PROVIDER: Kerrin Champagne, MD        PT End of Session - 05/19/21 1150     Visit Number 12    Date for PT Re-Evaluation 05/27/21    Authorization Type BC/BS    Authorization - Visit Number 12    Authorization - Number of Visits 30    PT Start Time 1148    PT Stop Time 1230    PT Time Calculation (min) 42 min    Activity Tolerance Patient tolerated treatment well    Behavior During Therapy WFL for tasks assessed/performed              Past Medical History:  Diagnosis Date   Depression    History reviewed. No pertinent surgical history. There are no problems to display for this patient.   REFERRING DIAG: M54.2 (ICD-10-CM) - Cervicalgia M47.812 (ICD-10-CM) - Spondylosis without myelopathy or radiculopathy, cervical region M75.41 (ICD-10-CM) - Impingement syndrome of right shoulder M75.42 (ICD-10-CM) - Impingement syndrome of left shoulder    THERAPY DIAG:  Cervicalgia  Cramp and spasm  Muscle weakness (generalized)  PERTINENT HISTORY: Fibromyalgia, ongoing dizziness going to be worked up at neurologist  PRECAUTIONS: n/a  ONSET DATE: 03/25/2021    SUBJECTIVE: Overall 20% improvement since starting this round of PT.  I have more ROM of my arms and some improvement in my neck.  Today isn't a great day - I think I've been overdoing activity given I've felt improved.   PAIN:  Are you having pain? Yes: NPRS scale: 7/10 Pain location: Rt arm radiating pain from shoulder to wrist and in Lt neck Pain description: radiating down arms Aggravating factors: worse in the AM and with activity Relieving factors: CBD oil  OBJECTIVE:    DIAGNOSTIC FINDINGS:  03/25/2021 Cervical Radiograph:  AP and lateral radiographs in flexion and extension of the cervical spine demonstrate disc  narrowing C4-5  C5-6, C6-7 and C7-T1. Spondylosis changes bilateral uncovertebral joints  C5-6, C6-7and C7-T1. Loss of normal Cervical lordotic curve due to anterior column shortening.    PATIENT SURVEYS:  FOTO 38% at initial evaluation (projected of 55% by visit 13)  05/12/2021:  FOTO 37%   SENSATION: 03/31/21: Light touch: Impaired Pt reports impaired sensation on the right side   POSTURE:  03/31/21: Forward head, rounded shoulders   PALPATION: 03/31/21: Tender to palpation along cervical paraspinals.  Muscle spasms noted upper trap and cervical multifidi.      CERVICAL ROM:    Active ROM A/PROM (deg) 03/31/2021 A/ROM (Deg) 05/03/2021 A/ROM (Deg) 05/19/21  Flexion 25 50 30, pulling pain  Extension 5 10 22   Right lateral flexion 25 25   Left lateral flexion 5 20   Right rotation 28 40 60  Left rotation 22 30 50   (Blank rows = not tested)   UE ROM:   Active ROM Right 03/31/2021 Right 05/12/2021 Right  05/19/21 Left 03/31/2021  Shoulder flexion 99 136 150 155  Shoulder extension        Shoulder abduction 112 125 104 145  Shoulder adduction        Shoulder extension        Shoulder internal rotation        Shoulder external rotation        Elbow flexion        Elbow  extension        Wrist flexion        Wrist extension        Wrist ulnar deviation        Wrist radial deviation        Wrist pronation        Wrist supination         (Blank rows = not tested)   UE MMT:   MMT Right 03/31/2021 Right 05/19/21 Left 03/31/2021 Left  05/19/21  Shoulder flexion 3 5 4    Shoulder extension   5     Shoulder abduction 3 5 4    Shoulder adduction   4     Shoulder extension   5     Shoulder internal rotation   4     Shoulder external rotation   5     Middle trapezius        Lower trapezius        Elbow flexion        Elbow extension        Wrist flexion        Wrist extension        Wrist ulnar deviation        Wrist radial deviation        Wrist pronation        Wrist  supination        Grip strength 45 45 60 53   (Blank rows = not tested)   FUNCTIONAL TESTS:  03/31/21: 5 times sit to stand: 22 sec without UE.  However, pt states that he is going slow secondary dizziness 05/12/2021:  5 times sit to stand: 16.4 sec without UE     TODAY'S TREATMENT:   05/19/21: UBE 2' each way PT present to update goals Shoulder yellow band 1x10 each Rt: flexion, abduction with yellow band 1x10 each Rt  Standing bil shoulder ER and horiz abd 1x10 Trigger Point Dry-Needling  Treatment instructions: Expect mild to moderate muscle soreness. S/S of pneumothorax if dry needled over a lung field, and to seek immediate medical attention should they occur. Patient verbalized understanding of these instructions and education.  Patient Consent Given: Yes Education handout provided: Previously provided Muscles treated: Rt infraspinatus, Rt upper trap Electrical stimulation performed: No Parameters: N/A Treatment response/outcome: signif twitch/release of both muscle groups  05/17/2021: UBE level 1.0 x3 min each way with PT present to discuss status Cat cow x10 Seated:  chin tuck, thoracic extension with ball behind back, shoulder ER with yellow tband, horizontal abduction with yellow tband.  2x10 each Shoulder flexion and shoulder abduction with yellow tband.  2x10 each bilat Radian nerve glide 2x10 Wall angel 2x10 Standing rows with 5# 2x10  05/12/2021: UBE level 1.0 x3 min each way with PT present to discuss status Bilat median nerve stretch 5 x15 sec Seated:  chin tuck, thoracic extension with ball behind back, shoulder ER with yellow tband, horizontal abduction with yellow tband.  2x10 each Cat cow x15 Shoulder flexion and shoulder abduction with yellow tband.  x10 each bilat DRY NEEDLING: Dry needling consent given? yes Educational handouts provided? yes Muscles treated: right cervical and thoracic multifidi, right upper trap Response from dry needling: twitch response  and muscle elongation. Utilized skilled palpation to identify trigger points     PATIENT EDUCATION:  Education details: Issued HEP Person educated: Patient Education method: Programmer, multimedia, Facilities manager, and Handouts Education comprehension: verbalized understanding and returned demonstration     HOME EXERCISE PROGRAM: Access  Code: [email or web address] URL: [email or web address] Date: 05/17/2021 Prepared by: Clydie Braun Menke  Exercises - Seated Upper Trapezius Stretch  - 1 x daily - 7 x weekly - 1 sets - 2 reps - 20 sec hold - Seated Cervical Retraction  - 2 x daily - 7 x weekly - 2 sets - 10 reps - Seated Scapular Retraction  - 1 x daily - 7 x weekly - 2 sets - 10 reps - Seated Shoulder Flexion Towel Slide at Table Top  - 1 x daily - 7 x weekly - 2 sets - 10 reps - Standing Anatomical Position with Scapular Retraction and Depression at Wall  - 1 x daily - 7 x weekly - 1 sets - 5 reps - 8 hold - Seated Isometric Cervical Rotation  - 1 x daily - 7 x weekly - 1 sets - 5 reps - 8 hold - Seated Isometric Cervical Sidebending  - 1 x daily - 7 x weekly - 1 sets - 5 reps - 8 hold - Seated Isometric Cervical Flexion  - 1 x daily - 7 x weekly - 1 sets - 5 reps - 8 hold - Seated Isometric Cervical Extension  - 1 x daily - 7 x weekly - 1 sets - 5 reps - 8 hold - Radial Nerve Iritable Nerve Glide  - 1 x daily - 7 x weekly - 2 sets - 10 reps - Radial Nerve Flossing  - 1 x daily - 7 x weekly - 2 sets - 10 reps - Shoulder External Rotation and Scapular Retraction with Resistance  - 1 x daily - 7 x weekly - 2 sets - 10 reps - Standing Shoulder Horizontal Abduction with Resistance  - 1 x daily - 7 x weekly - 2 sets - 10 reps - Wall Angels  - 1 x daily - 7 x weekly - 2 sets - 10 reps    ASSESSMENT:   CLINICAL IMPRESSION: Mr Suppes is making progress towards all goals.  He demos much improved cervical ROM and Rt shoulder ROM.  Rt shoulder strength has improved to 4+/5 to 5/5.  He reports 20%  improvement overall since starting PT. FOTO score has not improved to date despite objective measures improving.  Pt has ongoing radiating pain into Rt UE which at times is recreated with DN to posterior shoulder, suggesting possible contribution of pain from proximal muscular source.  He also has mild radial nerve tension.  Pt will continue to benefit from skilled PT to continue manual techniques and progress therex to further progress toward goals.       OBJECTIVE IMPAIRMENTS decreased ROM, decreased strength, dizziness, increased muscle spasms, impaired UE functional use, and pain.    ACTIVITY LIMITATIONS cleaning and community activity.    PERSONAL FACTORS 1 comorbidity: Hx of cervicalgia with radiculopathy  are also affecting patient's functional outcome.      REHAB POTENTIAL: Good   CLINICAL DECISION MAKING: Evolving/moderate complexity   EVALUATION COMPLEXITY: Moderate     GOALS: Goals reviewed with patient? Yes   SHORT TERM GOALS: Target date: 04/21/2021   Pt will be independent with initial HEP. Baseline:  Goal status: GOAL MET   2.  Pt will report a 40% improvement in symptoms since initial evaluation. Baseline: 20% Goal status: PARTIALLY MET     LONG TERM GOALS: Target date: 06/30/21   Pt will be independent with advanced HEP. Baseline:  Goal status: Ongoing   2.  Pt will have FOTO of at least 55% to demonstrate  increased functional mobility. Baseline: 38%, 37% 05/12/21 Goal status: Ongoing   3.  Pt will increase cervical A/ROM by at least 10 degrees to allow him to observe his surroundings with hiking. Baseline: see chart Goal status: GOAL MET   4.  Pt will increase R shoulder flexion and abduction A/ROM to at least 120 degrees to allow him to reach into overhead cabinets. Baseline: R shoulder flexion 150 degrees, R shoulder abduction 104 degrees. Goal status: MET FOR FLEXION, ONGOING FOR ABDUCTION   5.  Pt will increase R shoulder strength to at least 4+/5  to allow him to perform activities around home. Baseline: 4+ - 5/5 Goal status: MET     PLAN: PT FREQUENCY: 2x/week   PT DURATION: 6 weeks   PLANNED INTERVENTIONS: Therapeutic exercises, Therapeutic activity, Neuromuscular re-education, Balance training, Gait training, Patient/Family education, Joint manipulation, Joint mobilization, Vestibular training, Canalith repositioning, Aquatic Therapy, Dry Needling, Electrical stimulation, Cryotherapy, Moist heat, Traction, Ultrasound, Ionotophoresis 4mg /ml Dexamethasone, and Manual therapy   PLAN FOR NEXT SESSION: assess and progress HEP, strengthening, ROM, dry needling and/or manual therapy as indicated  Morton Peters, PT 05/19/21 1:24 PM   [facility] [address] [place], [place] [address] Phone # [phone] Fax [phone]

## 2021-05-23 DIAGNOSIS — F411 Generalized anxiety disorder: Secondary | ICD-10-CM | POA: Diagnosis not present

## 2021-05-23 DIAGNOSIS — F331 Major depressive disorder, recurrent, moderate: Secondary | ICD-10-CM | POA: Diagnosis not present

## 2021-05-24 ENCOUNTER — Ambulatory Visit: Payer: BC Managed Care – PPO | Admitting: Physical Therapy

## 2021-05-24 ENCOUNTER — Encounter: Payer: Self-pay | Admitting: Physical Therapy

## 2021-05-24 DIAGNOSIS — R252 Cramp and spasm: Secondary | ICD-10-CM | POA: Diagnosis not present

## 2021-05-24 DIAGNOSIS — M542 Cervicalgia: Secondary | ICD-10-CM

## 2021-05-24 DIAGNOSIS — M6281 Muscle weakness (generalized): Secondary | ICD-10-CM

## 2021-05-24 NOTE — Therapy (Signed)
OUTPATIENT PHYSICAL THERAPY TREATMENT NOTE   Patient Name: Henry Jennings MRN: [id number] DOB:1970-09-07, 51 y.o., male Today's Date: 05/24/2021  PCP: No primary care provider on file. REFERRING PROVIDER: Kerrin Champagne, MD        PT End of Session - 05/24/21 1147     Visit Number 13    Date for PT Re-Evaluation 06/30/21    Authorization Type BC/BS    Authorization - Visit Number 13    Authorization - Number of Visits 30    PT Start Time 1147    PT Stop Time 1230    PT Time Calculation (min) 43 min    Activity Tolerance Patient tolerated treatment well    Behavior During Therapy WFL for tasks assessed/performed               Past Medical History:  Diagnosis Date   Depression    History reviewed. No pertinent surgical history. There are no problems to display for this patient.   REFERRING DIAG: M54.2 (ICD-10-CM) - Cervicalgia M47.812 (ICD-10-CM) - Spondylosis without myelopathy or radiculopathy, cervical region M75.41 (ICD-10-CM) - Impingement syndrome of right shoulder M75.42 (ICD-10-CM) - Impingement syndrome of left shoulder    THERAPY DIAG:  Cervicalgia  Cramp and spasm  Muscle weakness (generalized)  PERTINENT HISTORY: Fibromyalgia, ongoing dizziness going to be worked up at neurologist  PRECAUTIONS: n/a  ONSET DATE: 03/25/2021    SUBJECTIVE: I had some very good days after last session but pain has increased across neck and shoulders today.  I got a lot of gardening done and was able to return to journaling with the relief into Rt UE with the DN last time.    PAIN:  Are you having pain? Yes: NPRS scale: 7/10 Pain location: Rt arm radiating pain from shoulder to wrist and in Lt neck Pain description: radiating down arms Aggravating factors: worse in the AM and with activity Relieving factors: CBD oil  OBJECTIVE:    DIAGNOSTIC FINDINGS:  03/25/2021 Cervical Radiograph:  AP and lateral radiographs in flexion and extension of the cervical spine  demonstrate disc narrowing C4-5  C5-6, C6-7 and C7-T1. Spondylosis changes bilateral uncovertebral joints  C5-6, C6-7and C7-T1. Loss of normal Cervical lordotic curve due to anterior column shortening.    PATIENT SURVEYS:  FOTO 38% at initial evaluation (projected of 55% by visit 13)  05/12/2021:  FOTO 37%   SENSATION: 03/31/21: Light touch: Impaired Pt reports impaired sensation on the right side   POSTURE:  03/31/21: Forward head, rounded shoulders   PALPATION: 03/31/21: Tender to palpation along cervical paraspinals.  Muscle spasms noted upper trap and cervical multifidi.      CERVICAL ROM:    Active ROM A/PROM (deg) 03/31/2021 A/ROM (Deg) 05/03/2021 A/ROM (Deg) 05/19/21  Flexion 25 50 30, pulling pain  Extension 5 10 22   Right lateral flexion 25 25   Left lateral flexion 5 20   Right rotation 28 40 60  Left rotation 22 30 50   (Blank rows = not tested)   UE ROM:   Active ROM Right 03/31/2021 Right 05/12/2021 Right  05/19/21 Left 03/31/2021  Shoulder flexion 99 136 150 155  Shoulder extension        Shoulder abduction 112 125 104 145  Shoulder adduction        Shoulder extension        Shoulder internal rotation        Shoulder external rotation        Elbow flexion  Elbow extension        Wrist flexion        Wrist extension        Wrist ulnar deviation        Wrist radial deviation        Wrist pronation        Wrist supination         (Blank rows = not tested)   UE MMT:   MMT Right 03/31/2021 Right 05/19/21 Left 03/31/2021 Left  05/19/21  Shoulder flexion 3 5 4    Shoulder extension   5     Shoulder abduction 3 5 4    Shoulder adduction   4     Shoulder extension   5     Shoulder internal rotation   4     Shoulder external rotation   5     Middle trapezius        Lower trapezius        Elbow flexion        Elbow extension        Wrist flexion        Wrist extension        Wrist ulnar deviation        Wrist radial deviation        Wrist  pronation        Wrist supination        Grip strength 45 45 60 53   (Blank rows = not tested)   FUNCTIONAL TESTS:  03/31/21: 5 times sit to stand: 22 sec without UE.  However, pt states that he is going slow secondary dizziness 05/12/2021:  5 times sit to stand: 16.4 sec without UE     TODAY'S TREATMENT:   05/24/21: UBE 2' each way PT present to discuss plan for session today Wall angel 2x10 Standing neck retraction into ball on wall 10x5" Standing neck retraction with resistance into towel 10x5" Seated thoracic extension over ball hands behind neck x 10 reps Yellow tband standing horiz abd and ER 2x10 Trigger Point Dry-Needling  Treatment instructions: Expect mild to moderate muscle soreness. S/S of pneumothorax if dry needled over a lung field, and to seek immediate medical attention should they occur. Patient verbalized understanding of these instructions and education.  Patient Consent Given: Yes Education handout provided: Previously provided Muscles treated: cervical multifidi bil C5-T1, bil upper trap, Rt teres minor and posterior deltoid Electrical stimulation performed: No Parameters: N/A Treatment response/outcome: twitch and release with elongation of muscles, reduced pain   05/19/21: UBE 2' each way PT present to update goals Shoulder yellow band 1x10 each Rt: flexion, abduction with yellow band 1x10 each Rt  Standing bil shoulder ER and horiz abd 1x10 Trigger Point Dry-Needling  Treatment instructions: Expect mild to moderate muscle soreness. S/S of pneumothorax if dry needled over a lung field, and to seek immediate medical attention should they occur. Patient verbalized understanding of these instructions and education.  Patient Consent Given: Yes Education handout provided: Previously provided Muscles treated: Rt infraspinatus, Rt upper trap Electrical stimulation performed: No Parameters: N/A Treatment response/outcome: signif twitch/release of both muscle  groups  05/17/2021: UBE level 1.0 x3 min each way with PT present to discuss status Cat cow x10 Seated:  chin tuck, thoracic extension with ball behind back, shoulder ER with yellow tband, horizontal abduction with yellow tband.  2x10 each Shoulder flexion and shoulder abduction with yellow tband.  2x10 each bilat Radian nerve glide 2x10 Wall angel 2x10 Standing rows  with 5# 2x10  05/12/2021: UBE level 1.0 x3 min each way with PT present to discuss status Bilat median nerve stretch 5 x15 sec Seated:  chin tuck, thoracic extension with ball behind back, shoulder ER with yellow tband, horizontal abduction with yellow tband.  2x10 each Cat cow x15 Shoulder flexion and shoulder abduction with yellow tband.  x10 each bilat DRY NEEDLING: Dry needling consent given? yes Educational handouts provided? yes Muscles treated: right cervical and thoracic multifidi, right upper trap Response from dry needling: twitch response and muscle elongation. Utilized skilled palpation to identify trigger points     PATIENT EDUCATION:  Education details: Issued HEP Person educated: Patient Education method: Programmer, multimedia, Facilities manager, and Handouts Education comprehension: verbalized understanding and returned demonstration     HOME EXERCISE PROGRAM: Access Code: [email or web address] URL: [email or web address] Date: 05/17/2021 Prepared by: Clydie Braun Menke  Exercises - Seated Upper Trapezius Stretch  - 1 x daily - 7 x weekly - 1 sets - 2 reps - 20 sec hold - Seated Cervical Retraction  - 2 x daily - 7 x weekly - 2 sets - 10 reps - Seated Scapular Retraction  - 1 x daily - 7 x weekly - 2 sets - 10 reps - Seated Shoulder Flexion Towel Slide at Table Top  - 1 x daily - 7 x weekly - 2 sets - 10 reps - Standing Anatomical Position with Scapular Retraction and Depression at Wall  - 1 x daily - 7 x weekly - 1 sets - 5 reps - 8 hold - Seated Isometric Cervical Rotation  - 1 x daily - 7 x weekly - 1 sets - 5  reps - 8 hold - Seated Isometric Cervical Sidebending  - 1 x daily - 7 x weekly - 1 sets - 5 reps - 8 hold - Seated Isometric Cervical Flexion  - 1 x daily - 7 x weekly - 1 sets - 5 reps - 8 hold - Seated Isometric Cervical Extension  - 1 x daily - 7 x weekly - 1 sets - 5 reps - 8 hold - Radial Nerve Iritable Nerve Glide  - 1 x daily - 7 x weekly - 2 sets - 10 reps - Radial Nerve Flossing  - 1 x daily - 7 x weekly - 2 sets - 10 reps - Shoulder External Rotation and Scapular Retraction with Resistance  - 1 x daily - 7 x weekly - 2 sets - 10 reps - Standing Shoulder Horizontal Abduction with Resistance  - 1 x daily - 7 x weekly - 2 sets - 10 reps - Wall Angels  - 1 x daily - 7 x weekly - 2 sets - 10 reps    ASSESSMENT:   CLINICAL IMPRESSION: Pt continues to report several days of relief following PT sessions.  He was able to garden and journal after last visit.  He reported return of 7/10 pain this morning.  He needs ongoing cueing for trunk alignment and correct performance of neck retraction within therex.  PT noted smaller and more localized TP with DN today in Rt upper trap, and no TP present today in Lt upper trap or infraspinatus on Rt.  Continue along POC with blend of therex, postural re-ed and manual techniques.     OBJECTIVE IMPAIRMENTS decreased ROM, decreased strength, dizziness, increased muscle spasms, impaired UE functional use, and pain.    ACTIVITY LIMITATIONS cleaning and community activity.    PERSONAL FACTORS 1 comorbidity: Hx of cervicalgia with radiculopathy  are also affecting  patient's functional outcome.      REHAB POTENTIAL: Good   CLINICAL DECISION MAKING: Evolving/moderate complexity   EVALUATION COMPLEXITY: Moderate     GOALS: Goals reviewed with patient? Yes   SHORT TERM GOALS: Target date: 04/21/2021   Pt will be independent with initial HEP. Baseline:  Goal status: GOAL MET   2.  Pt will report a 40% improvement in symptoms since initial  evaluation. Baseline: 20% Goal status: PARTIALLY MET     LONG TERM GOALS: Target date: 06/30/21   Pt will be independent with advanced HEP. Baseline:  Goal status: Ongoing   2.  Pt will have FOTO of at least 55% to demonstrate increased functional mobility. Baseline: 38%, 37% 05/12/21 Goal status: Ongoing   3.  Pt will increase cervical A/ROM by at least 10 degrees to allow him to observe his surroundings with hiking. Baseline: see chart Goal status: GOAL MET   4.  Pt will increase R shoulder flexion and abduction A/ROM to at least 120 degrees to allow him to reach into overhead cabinets. Baseline: R shoulder flexion 150 degrees, R shoulder abduction 104 degrees. Goal status: MET FOR FLEXION, ONGOING FOR ABDUCTION   5.  Pt will increase R shoulder strength to at least 4+/5 to allow him to perform activities around home. Baseline: 4+ - 5/5 Goal status: MET     PLAN: PT FREQUENCY: 2x/week   PT DURATION: 6 weeks   PLANNED INTERVENTIONS: Therapeutic exercises, Therapeutic activity, Neuromuscular re-education, Balance training, Gait training, Patient/Family education, Joint manipulation, Joint mobilization, Vestibular training, Canalith repositioning, Aquatic Therapy, Dry Needling, Electrical stimulation, Cryotherapy, Moist heat, Traction, Ultrasound, Ionotophoresis 4mg /ml Dexamethasone, and Manual therapy   PLAN FOR NEXT SESSION: assess and progress HEP, strengthening, ROM, dry needling and/or manual therapy as indicated  Morton Peters, PT 05/24/21 12:31 PM   [facility] [address] [place], [place] [address] Phone # [phone] Fax [phone]

## 2021-05-26 ENCOUNTER — Encounter: Payer: Self-pay | Admitting: Rehabilitative and Restorative Service Providers"

## 2021-05-26 ENCOUNTER — Ambulatory Visit: Payer: BC Managed Care – PPO | Admitting: Rehabilitative and Restorative Service Providers"

## 2021-05-26 DIAGNOSIS — R252 Cramp and spasm: Secondary | ICD-10-CM | POA: Diagnosis not present

## 2021-05-26 DIAGNOSIS — M6281 Muscle weakness (generalized): Secondary | ICD-10-CM | POA: Diagnosis not present

## 2021-05-26 DIAGNOSIS — M542 Cervicalgia: Secondary | ICD-10-CM

## 2021-05-30 DIAGNOSIS — F411 Generalized anxiety disorder: Secondary | ICD-10-CM | POA: Diagnosis not present

## 2021-05-30 DIAGNOSIS — F331 Major depressive disorder, recurrent, moderate: Secondary | ICD-10-CM | POA: Diagnosis not present

## 2021-06-01 ENCOUNTER — Encounter: Payer: Self-pay | Admitting: Physical Therapy

## 2021-06-01 ENCOUNTER — Ambulatory Visit: Payer: BC Managed Care – PPO | Admitting: Physical Therapy

## 2021-06-01 DIAGNOSIS — M542 Cervicalgia: Secondary | ICD-10-CM

## 2021-06-01 DIAGNOSIS — R252 Cramp and spasm: Secondary | ICD-10-CM

## 2021-06-01 DIAGNOSIS — M6281 Muscle weakness (generalized): Secondary | ICD-10-CM

## 2021-06-01 NOTE — Therapy (Signed)
OUTPATIENT PHYSICAL THERAPY TREATMENT NOTE   Patient Name: Henry Jennings MRN: [id number] DOB:02/19/1970, 51 y.o., male Today's Date: 06/01/2021  PCP: Pcp, No REFERRING PROVIDER: Nitka, James E, MD        PT End of Session - 06/01/21 1202     Visit Number 15    Date for PT Re-Evaluation 06/30/21    Authorization Type BC/BS    Authorization - Visit Number 15    Authorization - Number of Visits 30    PT Start Time 1200   Pt late   PT Stop Time 1235    PT Time Calculation (min) 35 min    Activity Tolerance Patient tolerated treatment well    Behavior During Therapy WFL for tasks assessed/performed                Past Medical History:  Diagnosis Date   Depression    History reviewed. No pertinent surgical history. There are no problems to display for this patient.   REFERRING DIAG: M54.2 (ICD-10-CM) - Cervicalgia M47.812 (ICD-10-CM) - Spondylosis without myelopathy or radiculopathy, cervical region M75.41 (ICD-10-CM) - Impingement syndrome of right shoulder M75.42 (ICD-10-CM) - Impingement syndrome of left shoulder    THERAPY DIAG:  Cervicalgia  Cramp and spasm  Muscle weakness (generalized)  PERTINENT HISTORY: Fibromyalgia, ongoing dizziness going to be worked up at neurologist  PRECAUTIONS: n/a  ONSET DATE: 03/25/2021    SUBJECTIVE: I had increased pain with rainy days but today is better.  I had pins/needles into Rt>Lt arm by Monday night after 3 days of building pain with rain.  PAIN:  Are you having pain? Yes: NPRS scale: 4/10 Pain location: Rt arm radiating pain from shoulder to wrist and in Lt neck Pain description: radiating down arms Aggravating factors: worse in the AM and with activity Relieving factors: CBD oil  OBJECTIVE:    DIAGNOSTIC FINDINGS:  03/25/2021 Cervical Radiograph:  AP and lateral radiographs in flexion and extension of the cervical spine demonstrate disc narrowing C4-5  C5-6, C6-7 and C7-T1. Spondylosis changes bilateral  uncovertebral joints  C5-6, C6-7and C7-T1. Loss of normal Cervical lordotic curve due to anterior column shortening.    PATIENT SURVEYS:  FOTO 38% at initial evaluation (projected of 55% by visit 13)  05/12/2021:  FOTO 37%   SENSATION: 03/31/21: Light touch: Impaired Pt reports impaired sensation on the right side   POSTURE:  03/31/21: Forward head, rounded shoulders   PALPATION: 03/31/21: Tender to palpation along cervical paraspinals.  Muscle spasms noted upper trap and cervical multifidi.      CERVICAL ROM:    Active ROM A/PROM (deg) 03/31/2021 A/ROM (Deg) 05/03/2021 A/ROM (Deg) 05/19/21  Flexion 25 50 30, pulling pain  Extension 5 10 22  Right lateral flexion 25 25   Left lateral flexion 5 20   Right rotation 28 40 60  Left rotation 22 30 50   (Blank rows = not tested)   UE ROM:   Active ROM Right 03/31/2021 Right 05/12/2021 Right  05/19/21 Left 03/31/2021  Shoulder flexion 99 136 150 155  Shoulder extension        Shoulder abduction 112 125 104 145  Shoulder adduction        Shoulder extension        Shoulder internal rotation        Shoulder external rotation        Elbow flexion        Elbow extension        Wrist flexion          OUTPATIENT PHYSICAL THERAPY TREATMENT NOTE   Patient Name: Henry Jennings MRN: [id number] DOB:04-10-1970, 51 y.o., male Today's Date: 06/01/2021  PCP: Merryl Hacker, No REFERRING PROVIDER: Jessy Oto, MD        PT End of Session - 06/01/21 1202     Visit Number 15    Date for PT Re-Evaluation 06/30/21    Authorization Type BC/BS    Authorization - Visit Number 15    Authorization - Number of Visits 30    PT Start Time 1200   Pt late   PT Stop Time 3903    PT Time Calculation (min) 35 min    Activity Tolerance Patient tolerated treatment well    Behavior During Therapy WFL for tasks assessed/performed                Past Medical History:  Diagnosis Date   Depression    History reviewed. No pertinent surgical history. There are no problems to display for this patient.   REFERRING DIAG: M54.2 (ICD-10-CM) - Cervicalgia M47.812 (ICD-10-CM) - Spondylosis without myelopathy or radiculopathy, cervical region M75.41 (ICD-10-CM) - Impingement syndrome of right shoulder M75.42 (ICD-10-CM) - Impingement syndrome of left shoulder    THERAPY DIAG:  Cervicalgia  Cramp and spasm  Muscle weakness (generalized)  PERTINENT HISTORY: Fibromyalgia, ongoing dizziness going to be worked up at neurologist  PRECAUTIONS: Robinette: 03/25/2021    SUBJECTIVE: I had increased pain with rainy days but today is better.  I had pins/needles into Rt>Lt arm by Monday night after 3 days of building pain with rain.  PAIN:  Are you having pain? Yes: NPRS scale: 4/10 Pain location: Rt arm radiating pain from shoulder to wrist and in Lt neck Pain description: radiating down arms Aggravating factors: worse in the AM and with activity Relieving factors: CBD oil  OBJECTIVE:    DIAGNOSTIC FINDINGS:  03/25/2021 Cervical Radiograph:  AP and lateral radiographs in flexion and extension of the cervical spine demonstrate disc narrowing C4-5  C5-6, C6-7 and C7-T1. Spondylosis changes bilateral  uncovertebral joints  C5-6, C6-7and C7-T1. Loss of normal Cervical lordotic curve due to anterior column shortening.    PATIENT SURVEYS:  FOTO 38% at initial evaluation (projected of 55% by visit 13)  05/12/2021:  FOTO 37%   SENSATION: 03/31/21: Light touch: Impaired Pt reports impaired sensation on the right side   POSTURE:  03/31/21: Forward head, rounded shoulders   PALPATION: 03/31/21: Tender to palpation along cervical paraspinals.  Muscle spasms noted upper trap and cervical multifidi.      CERVICAL ROM:    Active ROM A/PROM (deg) 03/31/2021 A/ROM (Deg) 05/03/2021 A/ROM (Deg) 05/19/21  Flexion 25 50 30, pulling pain  Extension _0 Right lateral flexion 25 25   Left lateral flexion 5 20   Right rotation 28 40 60  Left rotation 22 30 50   (Blank rows = not tested)   UE ROM:   Active ROM Right 03/31/2021 Right 05/12/2021 Right  05/19/21 Left 03/31/2021  Shoulder flexion 99 136 150 155  Shoulder extension        Shoulder abduction 112 125 104 145  Shoulder adduction        Shoulder extension        Shoulder internal rotation        Shoulder external rotation        Elbow flexion        Elbow extension        Wrist flexion  OUTPATIENT PHYSICAL THERAPY TREATMENT NOTE   Patient Name: Henry Jennings MRN: [id number] DOB:04-10-1970, 51 y.o., male Today's Date: 06/01/2021  PCP: Merryl Hacker, No REFERRING PROVIDER: Jessy Oto, MD        PT End of Session - 06/01/21 1202     Visit Number 15    Date for PT Re-Evaluation 06/30/21    Authorization Type BC/BS    Authorization - Visit Number 15    Authorization - Number of Visits 30    PT Start Time 1200   Pt late   PT Stop Time 3903    PT Time Calculation (min) 35 min    Activity Tolerance Patient tolerated treatment well    Behavior During Therapy WFL for tasks assessed/performed                Past Medical History:  Diagnosis Date   Depression    History reviewed. No pertinent surgical history. There are no problems to display for this patient.   REFERRING DIAG: M54.2 (ICD-10-CM) - Cervicalgia M47.812 (ICD-10-CM) - Spondylosis without myelopathy or radiculopathy, cervical region M75.41 (ICD-10-CM) - Impingement syndrome of right shoulder M75.42 (ICD-10-CM) - Impingement syndrome of left shoulder    THERAPY DIAG:  Cervicalgia  Cramp and spasm  Muscle weakness (generalized)  PERTINENT HISTORY: Fibromyalgia, ongoing dizziness going to be worked up at neurologist  PRECAUTIONS: Robinette: 03/25/2021    SUBJECTIVE: I had increased pain with rainy days but today is better.  I had pins/needles into Rt>Lt arm by Monday night after 3 days of building pain with rain.  PAIN:  Are you having pain? Yes: NPRS scale: 4/10 Pain location: Rt arm radiating pain from shoulder to wrist and in Lt neck Pain description: radiating down arms Aggravating factors: worse in the AM and with activity Relieving factors: CBD oil  OBJECTIVE:    DIAGNOSTIC FINDINGS:  03/25/2021 Cervical Radiograph:  AP and lateral radiographs in flexion and extension of the cervical spine demonstrate disc narrowing C4-5  C5-6, C6-7 and C7-T1. Spondylosis changes bilateral  uncovertebral joints  C5-6, C6-7and C7-T1. Loss of normal Cervical lordotic curve due to anterior column shortening.    PATIENT SURVEYS:  FOTO 38% at initial evaluation (projected of 55% by visit 13)  05/12/2021:  FOTO 37%   SENSATION: 03/31/21: Light touch: Impaired Pt reports impaired sensation on the right side   POSTURE:  03/31/21: Forward head, rounded shoulders   PALPATION: 03/31/21: Tender to palpation along cervical paraspinals.  Muscle spasms noted upper trap and cervical multifidi.      CERVICAL ROM:    Active ROM A/PROM (deg) 03/31/2021 A/ROM (Deg) 05/03/2021 A/ROM (Deg) 05/19/21  Flexion 25 50 30, pulling pain  Extension _0 Right lateral flexion 25 25   Left lateral flexion 5 20   Right rotation 28 40 60  Left rotation 22 30 50   (Blank rows = not tested)   UE ROM:   Active ROM Right 03/31/2021 Right 05/12/2021 Right  05/19/21 Left 03/31/2021  Shoulder flexion 99 136 150 155  Shoulder extension        Shoulder abduction 112 125 104 145  Shoulder adduction        Shoulder extension        Shoulder internal rotation        Shoulder external rotation        Elbow flexion        Elbow extension        Wrist flexion  OUTPATIENT PHYSICAL THERAPY TREATMENT NOTE   Patient Name: Henry Jennings MRN: [id number] DOB:02/19/1970, 51 y.o., male Today's Date: 06/01/2021  PCP: Pcp, No REFERRING PROVIDER: Nitka, James E, MD        PT End of Session - 06/01/21 1202     Visit Number 15    Date for PT Re-Evaluation 06/30/21    Authorization Type BC/BS    Authorization - Visit Number 15    Authorization - Number of Visits 30    PT Start Time 1200   Pt late   PT Stop Time 1235    PT Time Calculation (min) 35 min    Activity Tolerance Patient tolerated treatment well    Behavior During Therapy WFL for tasks assessed/performed                Past Medical History:  Diagnosis Date   Depression    History reviewed. No pertinent surgical history. There are no problems to display for this patient.   REFERRING DIAG: M54.2 (ICD-10-CM) - Cervicalgia M47.812 (ICD-10-CM) - Spondylosis without myelopathy or radiculopathy, cervical region M75.41 (ICD-10-CM) - Impingement syndrome of right shoulder M75.42 (ICD-10-CM) - Impingement syndrome of left shoulder    THERAPY DIAG:  Cervicalgia  Cramp and spasm  Muscle weakness (generalized)  PERTINENT HISTORY: Fibromyalgia, ongoing dizziness going to be worked up at neurologist  PRECAUTIONS: n/a  ONSET DATE: 03/25/2021    SUBJECTIVE: I had increased pain with rainy days but today is better.  I had pins/needles into Rt>Lt arm by Monday night after 3 days of building pain with rain.  PAIN:  Are you having pain? Yes: NPRS scale: 4/10 Pain location: Rt arm radiating pain from shoulder to wrist and in Lt neck Pain description: radiating down arms Aggravating factors: worse in the AM and with activity Relieving factors: CBD oil  OBJECTIVE:    DIAGNOSTIC FINDINGS:  03/25/2021 Cervical Radiograph:  AP and lateral radiographs in flexion and extension of the cervical spine demonstrate disc narrowing C4-5  C5-6, C6-7 and C7-T1. Spondylosis changes bilateral  uncovertebral joints  C5-6, C6-7and C7-T1. Loss of normal Cervical lordotic curve due to anterior column shortening.    PATIENT SURVEYS:  FOTO 38% at initial evaluation (projected of 55% by visit 13)  05/12/2021:  FOTO 37%   SENSATION: 03/31/21: Light touch: Impaired Pt reports impaired sensation on the right side   POSTURE:  03/31/21: Forward head, rounded shoulders   PALPATION: 03/31/21: Tender to palpation along cervical paraspinals.  Muscle spasms noted upper trap and cervical multifidi.      CERVICAL ROM:    Active ROM A/PROM (deg) 03/31/2021 A/ROM (Deg) 05/03/2021 A/ROM (Deg) 05/19/21  Flexion 25 50 30, pulling pain  Extension 5 10 22  Right lateral flexion 25 25   Left lateral flexion 5 20   Right rotation 28 40 60  Left rotation 22 30 50   (Blank rows = not tested)   UE ROM:   Active ROM Right 03/31/2021 Right 05/12/2021 Right  05/19/21 Left 03/31/2021  Shoulder flexion 99 136 150 155  Shoulder extension        Shoulder abduction 112 125 104 145  Shoulder adduction        Shoulder extension        Shoulder internal rotation        Shoulder external rotation        Elbow flexion        Elbow extension        Wrist flexion        

## 2021-06-06 ENCOUNTER — Encounter: Payer: Self-pay | Admitting: Neurology

## 2021-06-06 ENCOUNTER — Ambulatory Visit: Payer: BC Managed Care – PPO | Admitting: Neurology

## 2021-06-06 VITALS — BP 122/92 | HR 102 | Ht 66.0 in | Wt 134.5 lb

## 2021-06-06 DIAGNOSIS — R269 Unspecified abnormalities of gait and mobility: Secondary | ICD-10-CM

## 2021-06-06 DIAGNOSIS — M797 Fibromyalgia: Secondary | ICD-10-CM | POA: Insufficient documentation

## 2021-06-06 DIAGNOSIS — F419 Anxiety disorder, unspecified: Secondary | ICD-10-CM | POA: Insufficient documentation

## 2021-06-06 DIAGNOSIS — K5900 Constipation, unspecified: Secondary | ICD-10-CM | POA: Insufficient documentation

## 2021-06-06 DIAGNOSIS — G9332 Myalgic encephalomyelitis/chronic fatigue syndrome: Secondary | ICD-10-CM | POA: Insufficient documentation

## 2021-06-06 DIAGNOSIS — M542 Cervicalgia: Secondary | ICD-10-CM

## 2021-06-06 DIAGNOSIS — F319 Bipolar disorder, unspecified: Secondary | ICD-10-CM | POA: Insufficient documentation

## 2021-06-06 DIAGNOSIS — F410 Panic disorder [episodic paroxysmal anxiety] without agoraphobia: Secondary | ICD-10-CM | POA: Insufficient documentation

## 2021-06-06 DIAGNOSIS — G4700 Insomnia, unspecified: Secondary | ICD-10-CM | POA: Insufficient documentation

## 2021-06-06 NOTE — Progress Notes (Signed)
Chief Complaint  Patient presents with   New Patient (Initial Visit)    Rm 17. Alone. NP internal referral for Bilateral LE hyperreflexia, neck pain, numbness and paresthesia assess for cervical radiculopathy.      ASSESSMENT AND PLAN  Henry Jennings is a 51 y.o. male   Known history of cervical degenerative changes, Recurrent neck pain, radiating pain to bilateral shoulder, intermittent upper extremity paresthesia  Hyperreflexia of bilateral lower extremity on examination  MRI of cervical is pending for June 10, 2021, ordered by orthopedic surgeon Dr. Vira Browns  History and examination are most suggestive of cervical pathology, will follow-up MRI of cervical spine as well  Return to clinic with nurse practitioner in 2 months  Laboratory result from primary care physician at [facility] family practice  DIAGNOSTIC DATA (LABS, IMAGING, TESTING) - I reviewed patient records, labs, notes, testing and imaging myself where available.   MEDICAL HISTORY:  Henry Jennings is a 51 year old male, seen in request by orthopedic surgeon Dr. Kerrin Champagne, for evaluation of neck pain, radiating pain along upper spine, bilateral shoulder, upper extremity, his primary care physician is Dr. Lenise Arena, [facility], and initial evaluation was on June 06, 2021  I reviewed and summarized the referring note. PMHX. 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 Newell Coral, had a flareup in 2017 of neck pain, radiating pain to upper extremity, quickly improved after NSAIDs and physical therapy  In January 2023, he had recurrent neck pain, limited range of motion of bilateral upper extremity, difficulty  lifting overhead, intermittent radiating paresthesia from neck to bilateral shoulder, upper extremity, also normal upper back, he has tried physical therapy for few weeks with no significant improvement  He is taking gabapentin 100 mg 2 tablets twice a day with some help, also taking CBD up to 300 mg daily, only helping temporarily, post high financial burden  He also complains of mildly unsteady gait, denies bowel and bladder incontinence.  PHYSICAL EXAM:   Vitals:   06/06/21 1415  BP: (!) 122/92  Pulse: (!) 102  Weight: 134 lb 8 oz (61 kg)  Height: 5\' 6"  (1.676 m)   Not recorded     Body mass index is 21.71 kg/m.  PHYSICAL EXAMNIATION:  Gen: NAD, conversant, well nourised, well groomed                     Cardiovascular: Regular rate rhythm, no peripheral edema, warm, nontender. Eyes: Conjunctivae clear without exudates or hemorrhage Neck: Supple, no carotid bruits. Pulmonary: Clear to auscultation bilaterally   NEUROLOGICAL EXAM:  MENTAL STATUS: Speech/cognition: Awake, alert, oriented to history taking and casual conversation CRANIAL NERVES: CN II: Visual fields are full to confrontation. Pupils are round equal and briskly reactive to light. CN III, IV, VI: extraocular movement are normal. No ptosis. CN V: Facial sensation is intact to light touch CN VII: Face is symmetric with normal eye closure  CN VIII: Hearing is normal to causal conversation. CN IX, X: Phonation is normal. CN XI: Head turning and shoulder shrug are intact  MOTOR: There is no pronator drift of out-stretched arms. Muscle bulk and tone are normal. Muscle strength is normal.  REFLEXES: Reflexes  are 2+ and symmetric at the biceps, triceps, 3/3 knees, and ankles. Plantar responses are flexor.  SENSORY: Intact to light touch, pinprick and vibratory sensation are intact in fingers and toes.  COORDINATION: There is no trunk or limb dysmetria noted.  GAIT/STANCE: Posture is normal. Gait is  steady with normal steps, base, arm swing, and turning.  Mild difficulty with tandem walking,  REVIEW OF SYSTEMS:  Full 14 system review of systems performed and notable only for as above All other review of systems were negative.   ALLERGIES: Allergies  Allergen Reactions   Levofloxacin     Other reaction(s): rash,muscle ach   Metronidazole     Other reaction(s): rash, muscle ache   Penicillins Rash    Other reaction(s): Other (See Comments) Known; from childhood     HOME MEDICATIONS: Current Outpatient Medications  Medication Sig Dispense Refill   ALPRAZolam (XANAX XR) 0.5 MG 24 hr tablet Take 0.5 mg by mouth daily.     atomoxetine (STRATTERA) 40 MG capsule Take 40 mg by mouth daily.     diclofenac (VOLTAREN) 75 MG EC tablet Take 75 mg by mouth 2 (two) times daily.     DULoxetine (CYMBALTA) 60 MG capsule Take 60 mg by mouth 2 (two) times daily.     gabapentin (NEURONTIN) 100 MG capsule Take 2 capsules (200 mg total) by mouth 2 (two) times daily. 120 capsule 1   traZODone (DESYREL) 50 MG tablet Take 50 mg by mouth at bedtime.     Turmeric (QC TUMERIC COMPLEX PO) Take 800 mg by mouth daily.     UNABLE TO FIND Take 100 mg by mouth 3 (three) times daily. Med Name: CBD 300 mg     vitamin B-12 (CYANOCOBALAMIN) 100 MCG tablet Take 100 mcg by mouth daily.     No current facility-administered medications for this visit.    PAST MEDICAL HISTORY: Past Medical History:  Diagnosis Date   Depression     PAST SURGICAL HISTORY: History reviewed. No pertinent surgical history.  FAMILY HISTORY: History reviewed. No pertinent family history.  SOCIAL HISTORY: Social History   Socioeconomic History   Marital status: Single    Spouse name: Not on file   Number of children: Not on file   Years of education: Not on file   Highest education level: Not on file  Occupational History   Not on file  Tobacco Use   Smoking status: Never   Smokeless tobacco: Never  Substance and  Sexual Activity   Alcohol use: Not on file   Drug use: Not on file   Sexual activity: Not on file  Other Topics Concern   Not on file  Social History Narrative   Not on file   Social Determinants of Health   Financial Resource Strain: Not on file  Food Insecurity: Not on file  Transportation Needs: Not on file  Physical Activity: Not on file  Stress: Not on file  Social Connections: Not on file  Intimate Partner Violence: Not on file      Levert Feinstein, M.D. Ph.D.  [facility] [address], [address] [place], [place] [address] Ph: [phone] Fax: [phone]  CC:  Kerrin Champagne, MD [address] [place],  [place] [address]  Joycelyn Rua, MD

## 2021-06-07 ENCOUNTER — Ambulatory Visit: Payer: BC Managed Care – PPO | Attending: Specialist | Admitting: Rehabilitative and Restorative Service Providers"

## 2021-06-07 ENCOUNTER — Encounter: Payer: Self-pay | Admitting: Rehabilitative and Restorative Service Providers"

## 2021-06-07 DIAGNOSIS — M6281 Muscle weakness (generalized): Secondary | ICD-10-CM | POA: Diagnosis not present

## 2021-06-07 DIAGNOSIS — R252 Cramp and spasm: Secondary | ICD-10-CM | POA: Diagnosis not present

## 2021-06-07 DIAGNOSIS — M542 Cervicalgia: Secondary | ICD-10-CM | POA: Insufficient documentation

## 2021-06-07 NOTE — Therapy (Signed)
OUTPATIENT PHYSICAL THERAPY TREATMENT NOTE   Patient Name: Henry Jennings MRN: [id number] DOB:06/29/1970, 51 y.o., male Today's Date: 06/07/2021  PCP: Joycelyn Rua, MD REFERRING PROVIDER: Kerrin Champagne, MD        PT End of Session - 06/07/21 1240     Visit Number 16    Date for PT Re-Evaluation 06/30/21    Authorization Type BC/BS    Authorization - Visit Number 16    Authorization - Number of Visits 30    PT Start Time 1235    PT Stop Time 1315    PT Time Calculation (min) 40 min    Activity Tolerance Patient tolerated treatment well    Behavior During Therapy [facility] for tasks assessed/performed                Past Medical History:  Diagnosis Date   Depression    History reviewed. No pertinent surgical history. Patient Active Problem List   Diagnosis Date Noted   Anxiety 06/06/2021   Bipolar disorder (HCC) 06/06/2021   Chronic fatigue syndrome 06/06/2021   Fibromyalgia 06/06/2021   Constipation 06/06/2021   Insomnia 06/06/2021   Panic disorder 06/06/2021   Gait abnormality 06/06/2021   Neck pain 06/06/2021    REFERRING DIAG: M54.2 (ICD-10-CM) - Cervicalgia M47.812 (ICD-10-CM) - Spondylosis without myelopathy or radiculopathy, cervical region M75.41 (ICD-10-CM) - Impingement syndrome of right shoulder M75.42 (ICD-10-CM) - Impingement syndrome of left shoulder    THERAPY DIAG:  Cervicalgia  Cramp and spasm  Muscle weakness (generalized)  PERTINENT HISTORY: Fibromyalgia, ongoing dizziness going to be worked up at neurologist  PRECAUTIONS: n/a  ONSET DATE: 03/25/2021    SUBJECTIVE: Pt reports that he was having a lot of pain this morning, but after his shower and stretches/yoga, he has had a decrease in his pain.  Pt reports that he has a MRI scheduled on Friday.  PAIN:  Are you having pain? Yes: NPRS scale: 4/10 Pain location: Rt arm radiating pain from shoulder to wrist and in Lt neck Pain description: radiating down arms Aggravating factors:  worse in the AM and with activity Relieving factors: CBD oil  OBJECTIVE:    DIAGNOSTIC FINDINGS:  03/25/2021 Cervical Radiograph:  AP and lateral radiographs in flexion and extension of the cervical spine demonstrate disc narrowing C4-5  C5-6, C6-7 and C7-T1. Spondylosis changes bilateral uncovertebral joints  C5-6, C6-7and C7-T1. Loss of normal Cervical lordotic curve due to anterior column shortening.    PATIENT SURVEYS:  FOTO 38% at initial evaluation (projected of 55% by visit 13)  05/12/2021:  FOTO 37%   SENSATION: 03/31/21: Light touch: Impaired Pt reports impaired sensation on the right side   POSTURE:  03/31/21: Forward head, rounded shoulders   PALPATION: 03/31/21: Tender to palpation along cervical paraspinals.  Muscle spasms noted upper trap and cervical multifidi.      CERVICAL ROM:    Active ROM A/PROM (deg) 03/31/2021 A/ROM (Deg) 05/03/2021 A/ROM (Deg) 05/19/21  Flexion 25 50 30, pulling pain  Extension 5 10 22   Right lateral flexion 25 25   Left lateral flexion 5 20   Right rotation 28 40 60  Left rotation 22 30 50   (Blank rows = not tested)   UE ROM:   Active ROM Right 03/31/2021 Right 05/12/2021 Right  05/19/21 Left 03/31/2021  Shoulder flexion 99 136 150 155  Shoulder extension        Shoulder abduction 112 125 104 145  Shoulder adduction  Shoulder extension        Shoulder internal rotation        Shoulder external rotation        Elbow flexion        Elbow extension        Wrist flexion        Wrist extension        Wrist ulnar deviation        Wrist radial deviation        Wrist pronation        Wrist supination         (Blank rows = not tested)   UE MMT:   MMT Right 03/31/2021 Right 05/19/21 Left 03/31/2021 Left  05/19/21  Shoulder flexion 3 5 4    Shoulder extension   5     Shoulder abduction 3 5 4    Shoulder adduction   4     Shoulder extension   5     Shoulder internal rotation   4     Shoulder external rotation   5      Middle trapezius        Lower trapezius        Elbow flexion        Elbow extension        Wrist flexion        Wrist extension        Wrist ulnar deviation        Wrist radial deviation        Wrist pronation        Wrist supination        Grip strength 45 45 60 53   (Blank rows = not tested)   FUNCTIONAL TESTS:  03/31/21: 5 times sit to stand: 22 sec without UE.  However, pt states that he is going slow secondary dizziness 05/12/2021:  5 times sit to stand: 16.4 sec without UE     TODAY'S TREATMENT:    06/07/2021: UBE level 2.0 x3 min each way with PT present to discuss status Ball on wall x 20 circles each way, bilat with pink ball Wall angels 2x10 3 way scapular stabilization on wall with blue loop x5 bilat Isometric UE push into green pball x10 flexion, x10 abduction bilat Wall push ups 2x10 Bent over shoulder row 5lb 2x9 bilat Supine serratus punch with 5# x10 bilat Upper Trap stretch 2x20 sec bilat  06/01/21: UBE 2x2 L2 Ball on wall Rt UE x 20 circles each way Wall push ups x 12 Wall angels x 10 Supine serratus punch Rt UE 5lb x 15 Bent over Rt shoulder row 5lb 1x10 Supine serratus punch 5lb Rt x 10 Supine 2lb Rt shoulder flexion to 90, then horiz abd, back to 90 flexion, overhead, return to start - x 5 rounds - very shaky with this 3lb digiflex Rt x 1' Manual therapy: DN to Rt posterior shoulder with very little response today Trigger Point Dry-Needling  Treatment instructions: Expect mild to moderate muscle soreness. S/S of pneumothorax if dry needled over a lung field, and to seek immediate medical attention should they occur. Patient verbalized understanding of these instructions and education. Patient Consent Given: Yes Education handout provided: Previously provided Muscles treated: Rt posterior deltoid, teres minor, infrapinatus Electrical stimulation performed: No Parameters: N/A Treatment response/outcome: very little response today due to smaller size or  absence of TPs  05/26/2021: UBE level 1.0 x3 min each way with PT present to discuss status Facing wall:  wall  angels 2x10 Wall push ups 2x10 Standing chin tuck into ball 2x10 Yellow tband standing horiz abd and ER 2x10 Marjo Bicker pose 3 x 10 sec Manual therapy:  soft tissue mobilization and suboccipital release to cervical and upper trap region.  Manual traction 3 x 10 sec hold.     PATIENT EDUCATION:  Education details: Issued HEP Person educated: Patient Education method: Explanation, Facilities manager, and Handouts Education comprehension: verbalized understanding and returned demonstration     HOME EXERCISE PROGRAM: Access Code: [email or web address] URL: [email or web address] Date: 05/17/2021 Prepared by: Clydie Braun Cortland Crehan  Exercises - Seated Upper Trapezius Stretch  - 1 x daily - 7 x weekly - 1 sets - 2 reps - 20 sec hold - Seated Cervical Retraction  - 2 x daily - 7 x weekly - 2 sets - 10 reps - Seated Scapular Retraction  - 1 x daily - 7 x weekly - 2 sets - 10 reps - Seated Shoulder Flexion Towel Slide at Table Top  - 1 x daily - 7 x weekly - 2 sets - 10 reps - Standing Anatomical Position with Scapular Retraction and Depression at Wall  - 1 x daily - 7 x weekly - 1 sets - 5 reps - 8 hold - Seated Isometric Cervical Rotation  - 1 x daily - 7 x weekly - 1 sets - 5 reps - 8 hold - Seated Isometric Cervical Sidebending  - 1 x daily - 7 x weekly - 1 sets - 5 reps - 8 hold - Seated Isometric Cervical Flexion  - 1 x daily - 7 x weekly - 1 sets - 5 reps - 8 hold - Seated Isometric Cervical Extension  - 1 x daily - 7 x weekly - 1 sets - 5 reps - 8 hold - Radial Nerve Iritable Nerve Glide  - 1 x daily - 7 x weekly - 2 sets - 10 reps - Radial Nerve Flossing  - 1 x daily - 7 x weekly - 2 sets - 10 reps - Shoulder External Rotation and Scapular Retraction with Resistance  - 1 x daily - 7 x weekly - 2 sets - 10 reps - Standing Shoulder Horizontal Abduction with Resistance  - 1 x daily - 7 x  weekly - 2 sets - 10 reps - Wall Angels  - 1 x daily - 7 x weekly - 2 sets - 10 reps    ASSESSMENT:   CLINICAL IMPRESSION: Mr Hyacinthe presents to skilled rehabilitation reporting that he is feeling better overall and stating at least a 30% improvement since initial evaluation. Pt is progressing with scapular stabilization during his session today.  Pt requires tactile cuing throughout for improved body mechanics.  Pt to start to attempt to wean sessions down to once a week, as he is going to start working on a yoga class with a friend.  Pt continues to require skilled PT to progress towards goal related activities.     OBJECTIVE IMPAIRMENTS decreased ROM, decreased strength, dizziness, increased muscle spasms, impaired UE functional use, and pain.    ACTIVITY LIMITATIONS cleaning and community activity.    PERSONAL FACTORS 1 comorbidity: Hx of cervicalgia with radiculopathy  are also affecting patient's functional outcome.      REHAB POTENTIAL: Good   CLINICAL DECISION MAKING: Evolving/moderate complexity   EVALUATION COMPLEXITY: Moderate     GOALS: Goals reviewed with patient? Yes   SHORT TERM GOALS: Target date: 04/21/2021   Pt will be independent with initial HEP. Baseline:  Goal status: GOAL  MET   2.  Pt will report a 40% improvement in symptoms since initial evaluation. Baseline: 20% Goal status: PARTIALLY MET.  On 06/07/21 reports feeling 30% better.     LONG TERM GOALS: Target date: 06/30/21   Pt will be independent with advanced HEP. Baseline:  Goal status: Ongoing   2.  Pt will have FOTO of at least 55% to demonstrate increased functional mobility. Baseline: 38%, 37% 05/12/21 Goal status: Ongoing   3.  Pt will increase cervical A/ROM by at least 10 degrees to allow him to observe his surroundings with hiking. Baseline: see chart Goal status: GOAL MET   4.  Pt will increase R shoulder flexion and abduction A/ROM to at least 120 degrees to allow him to reach into  overhead cabinets. Baseline: R shoulder flexion 150 degrees, R shoulder abduction 104 degrees. Goal status: MET FOR FLEXION, ONGOING FOR ABDUCTION   5.  Pt will increase R shoulder strength to at least 4+/5 to allow him to perform activities around home. Baseline: 4+ - 5/5 Goal status: MET     PLAN: PT FREQUENCY: 2x/week   PT DURATION: 6 weeks   PLANNED INTERVENTIONS: Therapeutic exercises, Therapeutic activity, Neuromuscular re-education, Balance training, Gait training, Patient/Family education, Joint manipulation, Joint mobilization, Vestibular training, Canalith repositioning, Aquatic Therapy, Dry Needling, Electrical stimulation, Cryotherapy, Moist heat, Traction, Ultrasound, Ionotophoresis 4mg /ml Dexamethasone, and Manual therapy   PLAN FOR NEXT SESSION: assess and progress HEP, strengthening, ROM, dry needling and/or manual therapy as indicated, advance Rt UE strengthening  Reather Laurence, PT 06/07/21 1:23 PM    [facility] [address] [place], [place] [address] Phone # [phone] Fax [phone]

## 2021-06-09 ENCOUNTER — Ambulatory Visit: Payer: BC Managed Care – PPO | Admitting: Rehabilitative and Restorative Service Providers"

## 2021-06-10 ENCOUNTER — Ambulatory Visit
Admission: RE | Admit: 2021-06-10 | Discharge: 2021-06-10 | Disposition: A | Discharge status: Home / Self Care | Payer: BC Managed Care – PPO | Source: Ambulatory Visit | Attending: Specialist | Admitting: Specialist

## 2021-06-10 DIAGNOSIS — M542 Cervicalgia: Secondary | ICD-10-CM | POA: Diagnosis not present

## 2021-06-10 DIAGNOSIS — M4802 Spinal stenosis, cervical region: Secondary | ICD-10-CM | POA: Diagnosis not present

## 2021-06-10 DIAGNOSIS — M47812 Spondylosis without myelopathy or radiculopathy, cervical region: Secondary | ICD-10-CM | POA: Diagnosis not present

## 2021-06-13 ENCOUNTER — Telehealth: Payer: Self-pay

## 2021-06-13 ENCOUNTER — Ambulatory Visit: Payer: BC Managed Care – PPO

## 2021-06-13 DIAGNOSIS — F331 Major depressive disorder, recurrent, moderate: Secondary | ICD-10-CM | POA: Diagnosis not present

## 2021-06-13 DIAGNOSIS — F411 Generalized anxiety disorder: Secondary | ICD-10-CM | POA: Diagnosis not present

## 2021-06-13 NOTE — Telephone Encounter (Signed)
Left message due to no-show appt today.

## 2021-06-15 ENCOUNTER — Ambulatory Visit: Payer: BC Managed Care – PPO

## 2021-06-15 DIAGNOSIS — R252 Cramp and spasm: Secondary | ICD-10-CM

## 2021-06-15 DIAGNOSIS — M6281 Muscle weakness (generalized): Secondary | ICD-10-CM

## 2021-06-15 DIAGNOSIS — M542 Cervicalgia: Secondary | ICD-10-CM

## 2021-06-15 NOTE — Therapy (Signed)
OUTPATIENT PHYSICAL THERAPY TREATMENT NOTE   Patient Name: Henry Jennings MRN: [id number] DOB:1970-10-07, 51 y.o., male Today's Date: 06/15/2021  PCP: Joycelyn Rua, MD REFERRING PROVIDER: Kerrin Champagne, MD        PT End of Session - 06/15/21 1231     Visit Number 17    Date for PT Re-Evaluation 06/30/21    Authorization Type BC/BS    Authorization - Visit Number 17    Authorization - Number of Visits 30    PT Start Time 1148    PT Stop Time 1230    PT Time Calculation (min) 42 min    Activity Tolerance Patient tolerated treatment well    Behavior During Therapy [facility] for tasks assessed/performed                 Past Medical History:  Diagnosis Date   Depression    History reviewed. No pertinent surgical history. Patient Active Problem List   Diagnosis Date Noted   Anxiety 06/06/2021   Bipolar disorder (HCC) 06/06/2021   Chronic fatigue syndrome 06/06/2021   Fibromyalgia 06/06/2021   Constipation 06/06/2021   Insomnia 06/06/2021   Panic disorder 06/06/2021   Gait abnormality 06/06/2021   Neck pain 06/06/2021    REFERRING DIAG: M54.2 (ICD-10-CM) - Cervicalgia M47.812 (ICD-10-CM) - Spondylosis without myelopathy or radiculopathy, cervical region M75.41 (ICD-10-CM) - Impingement syndrome of right shoulder M75.42 (ICD-10-CM) - Impingement syndrome of left shoulder    THERAPY DIAG:  Cervicalgia  Cramp and spasm  Muscle weakness (generalized)  PERTINENT HISTORY: Fibromyalgia, ongoing dizziness going to be worked up at neurologist  PRECAUTIONS: n/a  ONSET DATE: 03/25/2021    SUBJECTIVE: Pt reports that he was having a lot of pain this morning, but after his shower and stretches/yoga, he has had a decrease in his pain.  Pt reports that he has a MRI scheduled on Friday.  PAIN:  Are you having pain? Yes: NPRS scale: 4/10 Pain location: Rt arm radiating pain from shoulder to wrist and in Lt neck Pain description: radiating down arms Aggravating  factors: worse in the AM and with activity Relieving factors: CBD oil  OBJECTIVE:    DIAGNOSTIC FINDINGS:  03/25/2021 Cervical Radiograph:  AP and lateral radiographs in flexion and extension of the cervical spine demonstrate disc narrowing C4-5  C5-6, C6-7 and C7-T1. Spondylosis changes bilateral uncovertebral joints  C5-6, C6-7and C7-T1. Loss of normal Cervical lordotic curve due to anterior column shortening.    PATIENT SURVEYS:  FOTO 38% at initial evaluation (projected of 55% by visit 13)  05/12/2021:  FOTO 37%   SENSATION: 03/31/21: Light touch: Impaired Pt reports impaired sensation on the right side   POSTURE:  03/31/21: Forward head, rounded shoulders   PALPATION: 03/31/21: Tender to palpation along cervical paraspinals.  Muscle spasms noted upper trap and cervical multifidi.      CERVICAL ROM:    Active ROM A/PROM (deg) 03/31/2021 A/ROM (Deg) 05/03/2021 A/ROM (Deg) 05/19/21  Flexion 25 50 30, pulling pain  Extension 5 10 22   Right lateral flexion 25 25   Left lateral flexion 5 20   Right rotation 28 40 60  Left rotation 22 30 50   (Blank rows = not tested)   UE ROM:   Active ROM Right 03/31/2021 Right 05/12/2021 Right  05/19/21 Left 03/31/2021  Shoulder flexion 99 136 150 155  Shoulder extension        Shoulder abduction 112 125 104 145  Shoulder adduction  Shoulder extension        Shoulder internal rotation        Shoulder external rotation        Elbow flexion        Elbow extension        Wrist flexion        Wrist extension        Wrist ulnar deviation        Wrist radial deviation        Wrist pronation        Wrist supination         (Blank rows = not tested)   UE MMT:   MMT Right 03/31/2021 Right 05/19/21 Left 03/31/2021 Left  05/19/21  Shoulder flexion 3 5 4    Shoulder extension   5     Shoulder abduction 3 5 4    Shoulder adduction   4     Shoulder extension   5     Shoulder internal rotation   4     Shoulder external rotation   5      Middle trapezius        Lower trapezius        Elbow flexion        Elbow extension        Wrist flexion        Wrist extension        Wrist ulnar deviation        Wrist radial deviation        Wrist pronation        Wrist supination        Grip strength 45 45 60 53   (Blank rows = not tested)   FUNCTIONAL TESTS:  03/31/21: 5 times sit to stand: 22 sec without UE.  However, pt states that he is going slow secondary dizziness 05/12/2021:  5 times sit to stand: 16.4 sec without UE     TODAY'S TREATMENT:   06/15/2021: UBE level 2.0 x2  min each way with PT present to discuss status Ball on wall x 20 circles each way, bilat with pink ball Wall angels 2x10 3 way scapular stabilization on wall with blue loop x5 bilat Isometric UE push into green pball x10 flexion, x10 abduction bilat Wall push ups 2x10 Bent over shoulder row 5lb 2x9 bilat Supine serratus punch with 5# x10 bilat Upper Trap stretch 2x20 sec bilat  06/07/2021: UBE level 2.0 x3 min each way with PT present to discuss status Ball on wall x 20 circles each way, bilat with pink ball Wall angels 2x10 3 way scapular stabilization on wall with blue loop x5 bilat Isometric UE push into green pball x10 flexion, x10 abduction bilat Wall push ups 2x10 Bent over shoulder row 5lb 2x9 bilat Supine serratus punch with 5# x10 bilat Upper Trap stretch 2x20 sec bilat  06/01/21: UBE 2x2 L2 Ball on wall Rt UE x 20 circles each way Wall push ups x 12 Wall angels x 10 Supine serratus punch Rt UE 5lb x 15 Bent over Rt shoulder row 5lb 1x10 Supine serratus punch 5lb Rt x 10 Supine 2lb Rt shoulder flexion to 90, then horiz abd, back to 90 flexion, overhead, return to start - x 5 rounds - very shaky with this 3lb digiflex Rt x 1' Manual therapy: DN to Rt posterior shoulder with very little response today Trigger Point Dry-Needling  Treatment instructions: Expect mild to moderate muscle soreness. S/S of pneumothorax if dry needled  over  a lung field, and to seek immediate medical attention should they occur. Patient verbalized understanding of these instructions and education. Patient Consent Given: Yes Education handout provided: Previously provided Muscles treated: Rt posterior deltoid, teres minor, infrapinatus Electrical stimulation performed: No Parameters: N/A Treatment response/outcome: very little response today due to smaller size or absence of TPs   PATIENT EDUCATION:  Education details: Issued HEP Person educated: Patient Education method: Explanation, Demonstration, and Handouts Education comprehension: verbalized understanding and returned demonstration     HOME EXERCISE PROGRAM: Access Code: [email or web address] URL: [email or web address] Date: 05/17/2021 Prepared by: Clydie Braun Menke  Exercises - Seated Upper Trapezius Stretch  - 1 x daily - 7 x weekly - 1 sets - 2 reps - 20 sec hold - Seated Cervical Retraction  - 2 x daily - 7 x weekly - 2 sets - 10 reps - Seated Scapular Retraction  - 1 x daily - 7 x weekly - 2 sets - 10 reps - Seated Shoulder Flexion Towel Slide at Table Top  - 1 x daily - 7 x weekly - 2 sets - 10 reps - Standing Anatomical Position with Scapular Retraction and Depression at Wall  - 1 x daily - 7 x weekly - 1 sets - 5 reps - 8 hold - Seated Isometric Cervical Rotation  - 1 x daily - 7 x weekly - 1 sets - 5 reps - 8 hold - Seated Isometric Cervical Sidebending  - 1 x daily - 7 x weekly - 1 sets - 5 reps - 8 hold - Seated Isometric Cervical Flexion  - 1 x daily - 7 x weekly - 1 sets - 5 reps - 8 hold - Seated Isometric Cervical Extension  - 1 x daily - 7 x weekly - 1 sets - 5 reps - 8 hold - Radial Nerve Iritable Nerve Glide  - 1 x daily - 7 x weekly - 2 sets - 10 reps - Radial Nerve Flossing  - 1 x daily - 7 x weekly - 2 sets - 10 reps - Shoulder External Rotation and Scapular Retraction with Resistance  - 1 x daily - 7 x weekly - 2 sets - 10 reps - Standing Shoulder  Horizontal Abduction with Resistance  - 1 x daily - 7 x weekly - 2 sets - 10 reps - Wall Angels  - 1 x daily - 7 x weekly - 2 sets - 10 reps    ASSESSMENT:   CLINICAL IMPRESSION: Pt reports 30% overall improvement since the start of care.  Pt is progressing with scapular stabilization during his session again today.  Pt had better control of scapula with bent rows today and required fewer tactile cues.  Pt will see Dr Otelia Sergeant to review MRI results tomorrow.  Pt will wean to 1x/wk for strength progression.  Pt continues to require skilled PT to progress towards goal related activities.     OBJECTIVE IMPAIRMENTS decreased ROM, decreased strength, dizziness, increased muscle spasms, impaired UE functional use, and pain.    ACTIVITY LIMITATIONS cleaning and community activity.    PERSONAL FACTORS 1 comorbidity: Hx of cervicalgia with radiculopathy  are also affecting patient's functional outcome.      REHAB POTENTIAL: Good   CLINICAL DECISION MAKING: Evolving/moderate complexity   EVALUATION COMPLEXITY: Moderate     GOALS: Goals reviewed with patient? Yes   SHORT TERM GOALS: Target date: 04/21/2021   Pt will be independent with initial HEP. Baseline:  Goal status: GOAL MET   2.  Pt will  report a 40% improvement in symptoms since initial evaluation. Baseline: 20% Goal status: PARTIALLY MET.  On 06/07/21 reports feeling 30% better.     LONG TERM GOALS: Target date: 06/30/21   Pt will be independent with advanced HEP. Baseline:  Goal status: Ongoing   2.  Pt will have FOTO of at least 55% to demonstrate increased functional mobility. Baseline: 38%, 37% 05/12/21 Goal status: Ongoing   3.  Pt will increase cervical A/ROM by at least 10 degrees to allow him to observe his surroundings with hiking. Baseline: see chart Goal status: GOAL MET   4.  Pt will increase R shoulder flexion and abduction A/ROM to at least 120 degrees to allow him to reach into overhead cabinets. Baseline: R  shoulder flexion 150 degrees, R shoulder abduction 104 degrees. Goal status: MET FOR FLEXION, ONGOING FOR ABDUCTION   5.  Pt will increase R shoulder strength to at least 4+/5 to allow him to perform activities around home. Baseline: 4+ - 5/5 Goal status: MET     PLAN: PT FREQUENCY: 2x/week   PT DURATION: 6 weeks   PLANNED INTERVENTIONS: Therapeutic exercises, Therapeutic activity, Neuromuscular re-education, Balance training, Gait training, Patient/Family education, Joint manipulation, Joint mobilization, Vestibular training, Canalith repositioning, Aquatic Therapy, Dry Needling, Electrical stimulation, Cryotherapy, Moist heat, Traction, Ultrasound, Ionotophoresis 4mg /ml Dexamethasone, and Manual therapy   PLAN FOR NEXT SESSION: Rt UE strength progression, scapular stabilization  [name], PT 06/15/21 12:31 PM     [facility] [address], [address] [place], [place] [address] Phone # [phone] Fax [phone]

## 2021-06-16 ENCOUNTER — Ambulatory Visit (INDEPENDENT_AMBULATORY_CARE_PROVIDER_SITE_OTHER): Payer: BC Managed Care – PPO | Admitting: Specialist

## 2021-06-16 ENCOUNTER — Encounter: Payer: Self-pay | Admitting: Specialist

## 2021-06-16 VITALS — BP 141/90 | HR 101 | Ht 66.0 in | Wt 134.5 lb

## 2021-06-16 DIAGNOSIS — M25511 Pain in right shoulder: Secondary | ICD-10-CM

## 2021-06-16 DIAGNOSIS — M79622 Pain in left upper arm: Secondary | ICD-10-CM

## 2021-06-16 DIAGNOSIS — R202 Paresthesia of skin: Secondary | ICD-10-CM

## 2021-06-16 DIAGNOSIS — M5030 Other cervical disc degeneration, unspecified cervical region: Secondary | ICD-10-CM

## 2021-06-16 DIAGNOSIS — M79621 Pain in right upper arm: Secondary | ICD-10-CM | POA: Diagnosis not present

## 2021-06-16 DIAGNOSIS — M542 Cervicalgia: Secondary | ICD-10-CM | POA: Diagnosis not present

## 2021-06-16 DIAGNOSIS — M25512 Pain in left shoulder: Secondary | ICD-10-CM | POA: Diagnosis not present

## 2021-06-16 DIAGNOSIS — M47812 Spondylosis without myelopathy or radiculopathy, cervical region: Secondary | ICD-10-CM | POA: Diagnosis not present

## 2021-06-16 DIAGNOSIS — R200 Anesthesia of skin: Secondary | ICD-10-CM

## 2021-06-16 DIAGNOSIS — M4802 Spinal stenosis, cervical region: Secondary | ICD-10-CM

## 2021-06-16 DIAGNOSIS — R292 Abnormal reflex: Secondary | ICD-10-CM

## 2021-06-16 NOTE — Patient Instructions (Addendum)
Avoid overhead lifting and overhead use of the arms. Do not lift greater than 5 lbs. Adjust head rest in vehicle to prevent hyperextension if rear ended. Take extra precautions to avoid falling, including use of a cane if you feel weak. Scheduling secretary Tivis Ringer will call you to arrange for surgery for your cervical spine. If you wish a second opinion please let us know and we can arrange for you. If you have worsening arm or leg numbness or weakness please call or go to an ER. We will contact your cardiologist and primary care physicians to seek clearance for your surgery. Surgery will be adecompression at the C4-5, C5-6, C6-7and C7-T1 level with bone grafting and plate and screws, Local bone graft as well and allograft bone graft and vivigen.  Risks of surgery include risks of infection, bleeding and risks to the spinal cord and  Risks of sore throat and difficulty swallowing which should  Improve over the next 4-6 weeks following surgery. Surgery is indicated due to upper extremity radiculopathy. In the future surgery at adjacent levels may be necessary but these levels do not appear to be related to your current symptoms or signs.

## 2021-06-16 NOTE — Progress Notes (Signed)
Office Visit Note   Patient: Henry Jennings           Date of Birth: 1970-12-05           MRN: [id number] Visit Date: 06/16/2021              Requested by: No referring provider defined for this encounter. PCP: Joycelyn Rua, MD   Assessment & Plan: Visit Diagnoses: No diagnosis found.  Plan: Avoid overhead lifting and overhead use of the arms. Do not lift greater than 5 lbs. Adjust head rest in vehicle to prevent hyperextension if rear ended. Take extra precautions to avoid falling, including use of a cane if you feel weak. Scheduling secretary Tivis Ringer will call you to arrange for surgery for your cervical spine. If you wish a second opinion please let us know and we can arrange for you. If you have worsening arm or leg numbness or weakness please call or go to an ER. We will contact your cardiologist and primary care physicians to seek clearance for your surgery. Surgery will be adecompression at the C4-5, C5-6, C6-7and C7-T1 level with bone grafting and plate and screws, Local bone graft as well and allograft bone graft and vivigen.  Risks of surgery include risks of infection, bleeding and risks to the spinal cord and  Risks of sore throat and difficulty swallowing which should  Improve over the next 4-6 weeks following surgery. Surgery is indicated due to upper extremity radiculopathy. In the future surgery at adjacent levels may be necessary but these levels do not appear to be related to your current symptoms or signs.   Follow-Up Instructions: No follow-ups on file.   Orders:  No orders of the defined types were placed in this encounter.  No orders of the defined types were placed in this encounter.     Procedures: No procedures performed   Clinical Data: No additional findings.   Subjective: Chief Complaint  Patient presents with   Neck - Follow-up   MRI Review    51 year old right handed male with over one year history of neck and arm and  shoulder pain. Night pain and is seeing a physical therapist. The arms are having difficulty with lifting and overhead use and are weak. Therapy helps motion some but not the pain or the weakness.   Review of Systems  Constitutional: Negative.   HENT: Negative.    Eyes: Negative.   Respiratory: Negative.    Cardiovascular: Negative.   Gastrointestinal: Negative.   Endocrine: Negative.   Genitourinary: Negative.   Musculoskeletal: Negative.   Skin: Negative.   Allergic/Immunologic: Negative.   Neurological: Negative.   Hematological: Negative.   Psychiatric/Behavioral: Negative.      Objective: Vital Signs: BP (!) 141/90   Pulse (!) 101   Ht 5\' 6"  (1.676 m)   Wt 134 lb 8 oz (61 kg)   BMI 21.71 kg/m   Physical Exam Constitutional:      Appearance: He is well-developed.  HENT:     Head: Normocephalic and atraumatic.  Eyes:     Pupils: Pupils are equal, round, and reactive to light.  Pulmonary:     Effort: Pulmonary effort is normal.     Breath sounds: Normal breath sounds.  Abdominal:     General: Bowel sounds are normal.     Palpations: Abdomen is soft.  Musculoskeletal:     Cervical back: Normal range of motion and neck supple.     Lumbar back: Negative  right straight leg raise test and negative left straight leg raise test.  Skin:    General: Skin is warm and dry.  Neurological:     Mental Status: He is alert and oriented to person, place, and time.  Psychiatric:        Behavior: Behavior normal.        Thought Content: Thought content normal.        Judgment: Judgment normal.   Back Exam   Tenderness  The patient is experiencing tenderness in the cervical.  Range of Motion  Extension:  abnormal  Flexion:  abnormal  Lateral bend right:  abnormal  Lateral bend left:  abnormal  Rotation right:  abnormal   Muscle Strength  Right Quadriceps:  5/5  Left Quadriceps:  5/5  Right Hamstrings:  5/5  Left Hamstrings:  5/5   Tests  Straight leg raise right:  negative Straight leg raise left: negative  Reflexes  Patellar:  2/4 Achilles:  2/4 Biceps:  2/4  Other  Toe walk: normal Heel walk: normal Sensation: normal Gait: normal  Erythema: no back redness Scars: absent  Comments:  Fine motor rapid movement deficitis bilaterally.     Specialty Comments:  No specialty comments available.  Imaging: No results found.   PMFS History: Patient Active Problem List   Diagnosis Date Noted   Anxiety 06/06/2021   Bipolar disorder (HCC) 06/06/2021   Chronic fatigue syndrome 06/06/2021   Fibromyalgia 06/06/2021   Constipation 06/06/2021   Insomnia 06/06/2021   Panic disorder 06/06/2021   Gait abnormality 06/06/2021   Neck pain 06/06/2021   Past Medical History:  Diagnosis Date   Depression     History reviewed. No pertinent family history.  History reviewed. No pertinent surgical history. Social History   Occupational History   Not on file  Tobacco Use   Smoking status: Never   Smokeless tobacco: Never  Substance and Sexual Activity   Alcohol use: Not on file   Drug use: Not on file   Sexual activity: Not on file

## 2021-06-21 ENCOUNTER — Ambulatory Visit: Payer: BC Managed Care – PPO

## 2021-06-23 ENCOUNTER — Encounter: Payer: Self-pay | Admitting: Rehabilitative and Restorative Service Providers"

## 2021-06-23 ENCOUNTER — Ambulatory Visit: Payer: BC Managed Care – PPO | Admitting: Neurology

## 2021-06-23 ENCOUNTER — Ambulatory Visit: Payer: BC Managed Care – PPO | Admitting: Rehabilitative and Restorative Service Providers"

## 2021-06-23 ENCOUNTER — Encounter: Payer: Self-pay | Admitting: Neurology

## 2021-06-23 DIAGNOSIS — M6281 Muscle weakness (generalized): Secondary | ICD-10-CM | POA: Diagnosis not present

## 2021-06-23 DIAGNOSIS — R252 Cramp and spasm: Secondary | ICD-10-CM

## 2021-06-23 DIAGNOSIS — M542 Cervicalgia: Secondary | ICD-10-CM | POA: Diagnosis not present

## 2021-06-23 NOTE — Therapy (Signed)
OUTPATIENT PHYSICAL THERAPY TREATMENT NOTE AND DISCHARGE SUMMARY   Patient Name: Henry Jennings MRN: [id number] DOB:08-19-1970, 51 y.o., male Today's Date: 06/23/2021  PCP: Joycelyn Rua, MD REFERRING PROVIDER: Kerrin Champagne, MD        PT End of Session - 06/23/21 1148     Visit Number 18    Date for PT Re-Evaluation 06/30/21    Authorization Type BC/BS    Authorization - Visit Number 18    Authorization - Number of Visits 30    PT Start Time 1145    PT Stop Time 1225    PT Time Calculation (min) 40 min    Activity Tolerance Patient tolerated treatment well    Behavior During Therapy [facility] for tasks assessed/performed                 Past Medical History:  Diagnosis Date   Depression    History reviewed. No pertinent surgical history. Patient Active Problem List   Diagnosis Date Noted   Anxiety 06/06/2021   Bipolar disorder (HCC) 06/06/2021   Chronic fatigue syndrome 06/06/2021   Fibromyalgia 06/06/2021   Constipation 06/06/2021   Insomnia 06/06/2021   Panic disorder 06/06/2021   Gait abnormality 06/06/2021   Neck pain 06/06/2021    REFERRING DIAG: M54.2 (ICD-10-CM) - Cervicalgia M47.812 (ICD-10-CM) - Spondylosis without myelopathy or radiculopathy, cervical region M75.41 (ICD-10-CM) - Impingement syndrome of right shoulder M75.42 (ICD-10-CM) - Impingement syndrome of left shoulder    THERAPY DIAG:  Cervicalgia  Cramp and spasm  Muscle weakness (generalized)  PERTINENT HISTORY: Fibromyalgia, ongoing dizziness going to be worked up at neurologist  PRECAUTIONS: as of 06/16/20 per Dr Otelia Sergeant:  Avoid overhead lifting and overhead use of the arms.  Do not lift greater than 5 lbs.  ONSET DATE: 03/25/2021    SUBJECTIVE: Pt had follow up with Dr Otelia Sergeant and he was advised to undergo a cervical decompression surgery for levels C4-C5, C5-C6, C6-C7, and C7-T1.  Surgery to be scheduled.  PAIN:  Are you having pain? Yes: NPRS scale: 7/10 Pain location: Rt arm  radiating pain from shoulder to wrist and in Lt neck Pain description: radiating down arms Aggravating factors: worse in the AM and with activity Relieving factors: CBD oil  OBJECTIVE:    DIAGNOSTIC FINDINGS:   Cervical MRI on 06/10/2021:   IMPRESSION: 1. At C4-5 there is a broad-based disc bulge flattening the ventral thecal sac. Bilateral uncovertebral degenerative changes. Moderate-severe bilateral foraminal stenosis. 2. At C5-6 there is a broad-based disc osteophyte complex. Bilateral uncovertebral degenerative changes. Severe right foraminal stenosis. Moderate-severe left foraminal stenosis. Mild spinal stenosis. 3. At C6-7 there is a broad-based disc osteophyte complex. Bilateral uncovertebral degenerative changes. Moderate-severe bilateral foraminal stenosis. 4. At C7-T1 there is a broad-based disc osteophyte complex. Severe bilateral foraminal stenosis. 5. Mild interval progression of cervical spondylosis compared with 08/01/2016.  03/25/2021 Cervical Radiograph:  AP and lateral radiographs in flexion and extension of the cervical spine demonstrate disc narrowing C4-5  C5-6, C6-7 and C7-T1. Spondylosis changes bilateral uncovertebral joints  C5-6, C6-7and C7-T1. Loss of normal Cervical lordotic curve due to anterior column shortening.    PATIENT SURVEYS:  FOTO 38% at initial evaluation (projected of 55% by visit 13)  05/12/2021:  FOTO 37% 06/23/2021:  FOTO 36%   SENSATION: 03/31/21: Sherlynn Stalls touch: Impaired Pt reports impaired sensation on the right side   POSTURE:  03/31/21: Forward head, rounded shoulders   PALPATION: 03/31/21: Tender to palpation along cervical paraspinals.  Muscle spasms noted  upper trap and cervical multifidi.      CERVICAL ROM:    Active ROM A/PROM (deg) 03/31/2021 A/ROM (Deg) 05/03/2021 A/ROM (Deg) 05/19/21 A/ROM (Deg) 06/23/21  Flexion 25 50 30, pulling pain 42  Extension 5 10 22  40  Right lateral flexion 25 25  25   Left lateral flexion 5 20   25   Right rotation 28 40 60   Left rotation 22 30 50    (Blank rows = not tested)   UE ROM:   Active ROM Right 03/31/2021 Right 05/12/2021 Right  05/19/21 Left 03/31/2021  Shoulder flexion 99 136 150 155  Shoulder extension        Shoulder abduction 112 125 104 145  Shoulder adduction        Shoulder extension        Shoulder internal rotation        Shoulder external rotation        Elbow flexion        Elbow extension        Wrist flexion        Wrist extension        Wrist ulnar deviation        Wrist radial deviation        Wrist pronation        Wrist supination         (Blank rows = not tested)   UE MMT:   MMT Right 03/31/2021 Right 05/19/21 Left 03/31/2021 Left  05/19/21  Shoulder flexion 3 5 4    Shoulder extension   5     Shoulder abduction 3 5 4    Shoulder adduction   4     Shoulder extension   5     Shoulder internal rotation   4     Shoulder external rotation   5     Middle trapezius        Lower trapezius        Elbow flexion        Elbow extension        Wrist flexion        Wrist extension        Wrist ulnar deviation        Wrist radial deviation        Wrist pronation        Wrist supination        Grip strength 45 45 60 53   (Blank rows = not tested)   FUNCTIONAL TESTS:  03/31/21: 5 times sit to stand: 22 sec without UE.  However, pt states that he is going slow secondary dizziness 05/12/2021:  5 times sit to stand: 16.4 sec without UE 06/23/2021:  5 times sit to/from stand:  14.5 sec without UE     TODAY'S TREATMENT:    06/23/2021: UBE level 2.0 x2 min each way, then x1 min fwd, x1 min back.  PT present to discuss status and upcoming surgery Funtional and objective testing, see above Wall slides 2x5 Manual therapy:  soft tissue mobilization to cervical paraspinals and suboccipital release  06/15/2021: UBE level 2.0 x2  min each way with PT present to discuss status Ball on wall x 20 circles each way, bilat with pink ball Wall angels  2x10 3 way scapular stabilization on wall with blue loop x5 bilat Isometric UE push into green pball x10 flexion, x10 abduction bilat Wall push ups 2x10 Bent over shoulder row 5lb 2x9 bilat Supine serratus punch with 5# x10 bilat Upper Trap  stretch 2x20 sec bilat  06/07/2021: UBE level 2.0 x3 min each way with PT present to discuss status Ball on wall x 20 circles each way, bilat with pink ball Wall angels 2x10 3 way scapular stabilization on wall with blue loop x5 bilat Isometric UE push into green pball x10 flexion, x10 abduction bilat Wall push ups 2x10 Bent over shoulder row 5lb 2x9 bilat Supine serratus punch with 5# x10 bilat Upper Trap stretch 2x20 sec bilat    PATIENT EDUCATION:  Education details: Issued HEP Person educated: Patient Education method: Explanation, Facilities manager, and Handouts Education comprehension: verbalized understanding and returned demonstration     HOME EXERCISE PROGRAM: Access Code: [email or web address] URL: [email or web address] Date: 05/17/2021 Prepared by: Clydie Braun Chinita Schimpf  Exercises - Seated Upper Trapezius Stretch  - 1 x daily - 7 x weekly - 1 sets - 2 reps - 20 sec hold - Seated Cervical Retraction  - 2 x daily - 7 x weekly - 2 sets - 10 reps - Seated Scapular Retraction  - 1 x daily - 7 x weekly - 2 sets - 10 reps - Seated Shoulder Flexion Towel Slide at Table Top  - 1 x daily - 7 x weekly - 2 sets - 10 reps - Standing Anatomical Position with Scapular Retraction and Depression at Wall  - 1 x daily - 7 x weekly - 1 sets - 5 reps - 8 hold - Seated Isometric Cervical Rotation  - 1 x daily - 7 x weekly - 1 sets - 5 reps - 8 hold - Seated Isometric Cervical Sidebending  - 1 x daily - 7 x weekly - 1 sets - 5 reps - 8 hold - Seated Isometric Cervical Flexion  - 1 x daily - 7 x weekly - 1 sets - 5 reps - 8 hold - Seated Isometric Cervical Extension  - 1 x daily - 7 x weekly - 1 sets - 5 reps - 8 hold - Radial Nerve Iritable Nerve Glide  - 1 x  daily - 7 x weekly - 2 sets - 10 reps - Radial Nerve Flossing  - 1 x daily - 7 x weekly - 2 sets - 10 reps - Shoulder External Rotation and Scapular Retraction with Resistance  - 1 x daily - 7 x weekly - 2 sets - 10 reps - Standing Shoulder Horizontal Abduction with Resistance  - 1 x daily - 7 x weekly - 2 sets - 10 reps - Wall Angels  - 1 x daily - 7 x weekly - 2 sets - 10 reps    ASSESSMENT:   CLINICAL IMPRESSION: Mr Palenzuela presents to skilled PT with new guidance from Dr Otelia Sergeant that he is recommended to undergo cervical decompression surgery.  Pt does not yet have scheduled date, but is going to be discharged from skilled PT at this time secondary to anticipated upcoming surgery.  Reviewed new precautions from Dr Otelia Sergeant with no overhead lifting and lifting restrictions of 5#. Pt verbalizes his understanding.  Pt is discharged at this time with goals not met to undergo surgical procedure.     OBJECTIVE IMPAIRMENTS decreased ROM, decreased strength, dizziness, increased muscle spasms, impaired UE functional use, and pain.    ACTIVITY LIMITATIONS cleaning and community activity.    PERSONAL FACTORS 1 comorbidity: Hx of cervicalgia with radiculopathy  are also affecting patient's functional outcome.      REHAB POTENTIAL: Good   CLINICAL DECISION MAKING: Evolving/moderate complexity   EVALUATION COMPLEXITY: Moderate     GOALS:  Goals reviewed with patient? Yes   SHORT TERM GOALS: Target date: 04/21/2021   Pt will be independent with initial HEP. Baseline:  Goal status: GOAL MET   2.  Pt will report a 40% improvement in symptoms since initial evaluation. Baseline: 20% Goal status: PARTIALLY MET.  On 06/07/21 reports feeling 30% better.     LONG TERM GOALS: Target date: 06/30/21   Pt will be independent with advanced HEP. Baseline:  Goal status: Goal Met   2.  Pt will have FOTO of at least 55% to demonstrate increased functional mobility. Baseline: 38%, 37% 05/12/21 Goal status:  Goal Not Met   3.  Pt will increase cervical A/ROM by at least 10 degrees to allow him to observe his surroundings with hiking. Baseline: see chart Goal status: GOAL MET   4.  Pt will increase R shoulder flexion and abduction A/ROM to at least 120 degrees to allow him to reach into overhead cabinets. Baseline: R shoulder flexion 150 degrees, R shoulder abduction 104 degrees. Goal status: MET FOR FLEXION, ONGOING FOR ABDUCTION   5.  Pt will increase R shoulder strength to at least 4+/5 to allow him to perform activities around home. Baseline: 4+ - 5/5 Goal status: MET     PLAN: PT FREQUENCY: 2x/week   PT DURATION: 6 weeks   PLANNED INTERVENTIONS: Therapeutic exercises, Therapeutic activity, Neuromuscular re-education, Balance training, Gait training, Patient/Family education, Joint manipulation, Joint mobilization, Vestibular training, Canalith repositioning, Aquatic Therapy, Dry Needling, Electrical stimulation, Cryotherapy, Moist heat, Traction, Ultrasound, Ionotophoresis 4mg /ml Dexamethasone, and Manual therapy   PLAN FOR NEXT SESSION: Pt discharged on 06/23/21 secondary to upcoming cervical decompression surgery  PHYSICAL THERAPY DISCHARGE SUMMARY    Patient agrees to discharge secondary to upcoming surgical procedure. Patient goals were partially met. Patient is being discharged due to the patient's request.    Reather Laurence, PT 06/23/21 12:29 PM     [facility] [address], [address] [place], [place] [address] Phone # [phone] Fax [phone]

## 2021-06-27 DIAGNOSIS — F331 Major depressive disorder, recurrent, moderate: Secondary | ICD-10-CM | POA: Diagnosis not present

## 2021-06-27 DIAGNOSIS — F411 Generalized anxiety disorder: Secondary | ICD-10-CM | POA: Diagnosis not present

## 2021-06-28 ENCOUNTER — Ambulatory Visit: Payer: BC Managed Care – PPO | Admitting: Rehabilitative and Restorative Service Providers"

## 2021-07-04 ENCOUNTER — Ambulatory Visit: Payer: BC Managed Care – PPO

## 2021-07-07 ENCOUNTER — Ambulatory Visit: Payer: BC Managed Care – PPO | Admitting: Rehabilitative and Restorative Service Providers"

## 2021-07-11 DIAGNOSIS — F331 Major depressive disorder, recurrent, moderate: Secondary | ICD-10-CM | POA: Diagnosis not present

## 2021-07-11 DIAGNOSIS — F411 Generalized anxiety disorder: Secondary | ICD-10-CM | POA: Diagnosis not present

## 2021-07-12 ENCOUNTER — Ambulatory Visit: Payer: BC Managed Care – PPO | Admitting: Physical Therapy

## 2021-07-12 DIAGNOSIS — F411 Generalized anxiety disorder: Secondary | ICD-10-CM | POA: Diagnosis not present

## 2021-07-12 DIAGNOSIS — F331 Major depressive disorder, recurrent, moderate: Secondary | ICD-10-CM | POA: Diagnosis not present

## 2021-07-14 ENCOUNTER — Ambulatory Visit: Payer: BC Managed Care – PPO | Admitting: Rehabilitative and Restorative Service Providers"

## 2021-07-15 ENCOUNTER — Other Ambulatory Visit: Payer: Self-pay

## 2021-07-18 DIAGNOSIS — F331 Major depressive disorder, recurrent, moderate: Secondary | ICD-10-CM | POA: Diagnosis not present

## 2021-07-18 DIAGNOSIS — F411 Generalized anxiety disorder: Secondary | ICD-10-CM | POA: Diagnosis not present

## 2021-07-21 ENCOUNTER — Ambulatory Visit: Payer: BC Managed Care – PPO | Admitting: Specialist

## 2021-07-25 ENCOUNTER — Other Ambulatory Visit: Payer: Self-pay | Admitting: Specialist

## 2021-07-25 MED ORDER — GABAPENTIN 100 MG PO CAPS
200.0000 mg | ORAL_CAPSULE | Freq: Two times a day (BID) | ORAL | 1 refills | Status: DC
Start: 2021-07-25 — End: 2021-11-04

## 2021-07-25 NOTE — Progress Notes (Addendum)
Surgical Instructions    Your procedure is scheduled on Tuesday August 1st.  Report to [facility] Main Entrance "A" at 5:30 A.M., then check in with the Admitting office.  Call this number if you have problems the morning of surgery:  [phone]   If you have any questions prior to your surgery date call [phone]: Open Monday-Friday 8am-4pm    Remember:  Do not eat after midnight the night before your surgery  You may drink clear liquids until 4:30am the morning of your surgery.   Clear liquids allowed are: Water, Non-Citrus Juices (without pulp), Carbonated Beverages, Clear Tea, Black Coffee ONLY (NO MILK, CREAM OR POWDERED CREAMER of any kind), and Gatorade    Take these medicines the morning of surgery with A SIP OF WATER: ALPRAZolam (XANAX XR) 0.5 MG 24 hr tablet if needed atomoxetine (STRATTERA) 40 MG capsule DULoxetine (CYMBALTA) 60 MG capsule gabapentin (NEURONTIN) 100 MG capsule   As of today, STOP taking any Aspirin (unless otherwise instructed by your surgeon) Diclofenac, Aleve, Naproxen, Ibuprofen, Motrin, Advil, Goody's, BC's, all herbal medications, fish oil, and all vitamins.           Do not wear jewelry  Do not wear lotions, powders, colognes, or deodorant. Do not shave 48 hours prior to surgery.  Men may shave face and neck. Do not bring valuables to the hospital. Do not wear nail polish   [facility] is not responsible for any belongings or valuables. .   Do NOT Smoke (Tobacco/Vaping)  24 hours prior to your procedure  If you use a CPAP at night, you may bring your mask for your overnight stay.   Contacts, glasses, hearing aids, dentures or partials may not be worn into surgery, please bring cases for these belongings   For patients admitted to the hospital, discharge time will be determined by your treatment team.   Patients discharged the day of surgery will not be allowed to drive home, and someone needs to stay with them for 24  hours.   SURGICAL WAITING ROOM VISITATION Patients having surgery or a procedure may have no more than 2 support people in the waiting area - these visitors may rotate.   Children under the age of 59 must have an adult with them who is not the patient. If the patient needs to stay at the hospital during part of their recovery, the visitor guidelines for inpatient rooms apply. Pre-op nurse will coordinate an appropriate time for 1 support person to accompany patient in pre-op.  This support person may not rotate.   Please refer to the [facility] website for the visitor guidelines for Inpatients (after your surgery is over and you are in a regular room).    Special instructions:    Oral Hygiene is also important to reduce your risk of infection.  Remember - BRUSH YOUR TEETH THE MORNING OF SURGERY WITH YOUR REGULAR TOOTHPASTE   [place]- Preparing For Surgery  Before surgery, you can play an important role. Because skin is not sterile, your skin needs to be as free of germs as possible. You can reduce the number of germs on your skin by washing with CHG (chlorahexidine gluconate) Soap before surgery.  CHG is an antiseptic cleaner which kills germs and bonds with the skin to continue killing germs even after washing.     Please do not use if you have an allergy to CHG or antibacterial soaps. If your skin becomes reddened/irritated stop using the CHG.  Do not  shave (including legs and underarms) for at least 48 hours prior to first CHG shower. It is OK to shave your face.  Please follow these instructions carefully.     Shower the NIGHT BEFORE SURGERY and the MORNING OF SURGERY with CHG Soap.   If you chose to wash your hair, wash your hair first as usual with your normal shampoo. After you shampoo, rinse your hair and body thoroughly to remove the shampoo.  Then [employer] and genitals (private parts) with your normal soap and rinse thoroughly to remove soap.  After that Use CHG Soap as  you would any other liquid soap. You can apply CHG directly to the skin and wash gently with a scrungie or a clean washcloth.   Apply the CHG Soap to your body ONLY FROM THE NECK DOWN.  Do not use on open wounds or open sores. Avoid contact with your eyes, ears, mouth and genitals (private parts). Wash Face and genitals (private parts)  with your normal soap.   Wash thoroughly, paying special attention to the area where your surgery will be performed.  Thoroughly rinse your body with warm water from the neck down.  DO NOT shower/wash with your normal soap after using and rinsing off the CHG Soap.  Pat yourself dry with a CLEAN TOWEL.  Wear CLEAN PAJAMAS to bed the night before surgery  Place CLEAN SHEETS on your bed the night before your surgery  DO NOT SLEEP WITH PETS.   Day of Surgery:  Take a shower with CHG soap. Wear Clean/Comfortable clothing the morning of surgery Do not apply any deodorants/lotions.   Remember to brush your teeth WITH YOUR REGULAR TOOTHPASTE.    If you received a COVID test during your pre-op visit, it is requested that you wear a mask when out in public, stay away from anyone that may not be feeling well, and notify your surgeon if you develop symptoms. If you have been in contact with anyone that has tested positive in the last 10 days, please notify your surgeon.    Please read over the following fact sheets that you were given.

## 2021-07-26 ENCOUNTER — Other Ambulatory Visit: Payer: Self-pay

## 2021-07-26 ENCOUNTER — Encounter (HOSPITAL_COMMUNITY)
Admission: RE | Admit: 2021-07-26 | Discharge: 2021-07-26 | Disposition: A | Discharge status: Home / Self Care | Payer: BC Managed Care – PPO | Source: Ambulatory Visit | Attending: Specialist | Admitting: Specialist

## 2021-07-26 ENCOUNTER — Encounter (HOSPITAL_COMMUNITY): Payer: Self-pay

## 2021-07-26 VITALS — BP 126/91 | HR 76 | Temp 98.2°F | Resp 17 | Ht 66.0 in | Wt 131.4 lb

## 2021-07-26 DIAGNOSIS — Z01812 Encounter for preprocedural laboratory examination: Secondary | ICD-10-CM | POA: Insufficient documentation

## 2021-07-26 DIAGNOSIS — Z01818 Encounter for other preprocedural examination: Secondary | ICD-10-CM

## 2021-07-26 HISTORY — DX: Other cervical disc displacement, unspecified cervical region: M50.20

## 2021-07-26 LAB — CBC
HCT: 45.7 % (ref 39.0–52.0)
Hemoglobin: 14.8 g/dL (ref 13.0–17.0)
MCH: 30 pg (ref 26.0–34.0)
MCHC: 32.4 g/dL (ref 30.0–36.0)
MCV: 92.7 fL (ref 80.0–100.0)
Platelets: 199 10*3/uL (ref 150–400)
RBC: 4.93 MIL/uL (ref 4.22–5.81)
RDW: 12.5 % (ref 11.5–15.5)
WBC: 5.3 10*3/uL (ref 4.0–10.5)
nRBC: 0 % (ref 0.0–0.2)

## 2021-07-26 LAB — TYPE AND SCREEN
ABO/RH(D): O POS
Antibody Screen: NEGATIVE

## 2021-07-26 LAB — SURGICAL PCR SCREEN
MRSA, PCR: NEGATIVE
Staphylococcus aureus: NEGATIVE

## 2021-07-26 NOTE — Progress Notes (Signed)
PCP - Joycelyn Rua, MD Cardiologist - Denies  PPM/ICD - Denies  Chest x-ray - NI EKG - NI Stress Test - 09/04/18 no f/u with cards ECHO - Denies Cardiac Cath - Denies  Sleep Study - Denies  DM - Denies  COVID TEST- No   Anesthesia review: No  Patient denies shortness of breath, fever, cough and chest pain at PAT appointment   All instructions explained to the patient, with a verbal understanding of the material. Patient agrees to go over the instructions while at home for a better understanding.  The opportunity to ask questions was provided.

## 2021-07-28 ENCOUNTER — Encounter: Payer: Self-pay | Admitting: Surgery

## 2021-07-28 ENCOUNTER — Ambulatory Visit: Payer: BC Managed Care – PPO | Admitting: Surgery

## 2021-07-28 VITALS — BP 121/86 | HR 66 | Temp 98.4°F | Wt 131.0 lb

## 2021-07-28 DIAGNOSIS — M4802 Spinal stenosis, cervical region: Secondary | ICD-10-CM

## 2021-07-28 NOTE — Progress Notes (Signed)
Office Visit Note   Patient: Henry Jennings           Date of Birth: January 30, 1970           MRN: [id number] Visit Date: 07/28/2021              Requested by: Joycelyn Rua, MD [address] [place],  [place] [address] PCP: Joycelyn Rua, MD   Assessment & Plan: Visit Diagnoses:  1. Spinal stenosis of cervical region   C4-T1 HNP/stenosis  Plan: We will proceed with C4-5, C5-6, C6-7, C7-T1 ANTERIOR CERVICAL DISCECTOMY AND FUSION WITH PLATES AND SCREWS as scheduled.  Surgical procedure discussed along potential rehab/recovery time.  Advised patient that he can anticipate being in a hard collar for at least 8 weeks postop.  Stressed to him the importance of being compliant with postop instructions due to the increased risk of pseudoarthrosis doing a multilevel fusion.  Patient voices understanding.  Follow-Up Instructions: No follow-ups on file.   Orders:  No orders of the defined types were placed in this encounter.  No orders of the defined types were placed in this encounter.     Procedures: No procedures performed   Clinical Data: No additional findings.   Subjective: Chief Complaint  Patient presents with   Lower Back - Routine Post Op    HPI 51 year old white male with history of C4-T1 HNP/stenosis comes in for prep evaluation.  States that symptoms unchanged from previous visit.  He is wanting to proceed with C4-5, C5-6, C6-7, C7-T1 ANTERIOR CERVICAL DISCECTOMY AND FUSION WITH PLATES AND SCREWS as scheduled.  Today history and physical performed.  Review of systems negative. Review of Systems No current cardiopulmonary GI/GU issues  Objective: Vital Signs: BP 121/86   Pulse 66   Temp 98.4 F (36.9 C)   Wt 131 lb (59.4 kg)   SpO2 98%   BMI 21.14 kg/m   Physical Exam HENT:     Head: Normocephalic and atraumatic.     Nose: Nose normal.  Eyes:     Extraocular Movements: Extraocular movements intact.  Cardiovascular:     Rate and Rhythm: Normal  rate and regular rhythm.     Heart sounds: No murmur heard. Pulmonary:     Effort: Pulmonary effort is normal. No respiratory distress.     Breath sounds: Normal breath sounds.  Abdominal:     General: Bowel sounds are normal.  Neurological:     Mental Status: He is alert and oriented to person, place, and time.  Psychiatric:        Mood and Affect: Mood normal.     Ortho Exam  Specialty Comments:  No specialty comments available.  Imaging: No results found.   PMFS History: Patient Active Problem List   Diagnosis Date Noted   Anxiety 06/06/2021   Bipolar disorder (HCC) 06/06/2021   Chronic fatigue syndrome 06/06/2021   Fibromyalgia 06/06/2021   Constipation 06/06/2021   Insomnia 06/06/2021   Panic disorder 06/06/2021   Gait abnormality 06/06/2021   Neck pain 06/06/2021   Past Medical History:  Diagnosis Date   Depression    Herniated disc, cervical     No family history on file.  Past Surgical History:  Procedure Laterality Date   colonscopy  2021   WISDOM TOOTH EXTRACTION Left 2020   Left lower   Social History   Occupational History   Not on file  Tobacco Use   Smoking status: Never   Smokeless tobacco: Never  Vaping Use   Vaping Use: Never used  Substance and Sexual Activity   Alcohol use: Not Currently   Drug use: Never   Sexual activity: Yes

## 2021-08-01 ENCOUNTER — Encounter (HOSPITAL_COMMUNITY): Payer: Self-pay | Admitting: Specialist

## 2021-08-01 DIAGNOSIS — F331 Major depressive disorder, recurrent, moderate: Secondary | ICD-10-CM | POA: Diagnosis not present

## 2021-08-01 DIAGNOSIS — F411 Generalized anxiety disorder: Secondary | ICD-10-CM | POA: Diagnosis not present

## 2021-08-01 NOTE — Anesthesia Preprocedure Evaluation (Signed)
Anesthesia Evaluation  Patient identified by MRN, date of birth, ID band Patient awake    Reviewed: Allergy & Precautions, NPO status , Patient's Chart, lab work & pertinent test results, reviewed documented beta blocker date and time   Airway Mallampati: II  TM Distance: >3 FB Neck ROM: Limited  Mouth opening: Limited Mouth Opening  Dental no notable dental hx. (+) Dental Advisory Given, Chipped,    Pulmonary neg pulmonary ROS,    Pulmonary exam normal breath sounds clear to auscultation       Cardiovascular negative cardio ROS Normal cardiovascular exam Rhythm:Regular Rate:Normal     Neuro/Psych PSYCHIATRIC DISORDERS Anxiety Depression Bipolar Disorder Hx/o panic attacks Neuromuscular disease    GI/Hepatic negative GI ROS, Neg liver ROS,   Endo/Other  negative endocrine ROS  Renal/GU negative Renal ROS  negative genitourinary   Musculoskeletal  (+) Fibromyalgia -Cervical spinal stenosis C4-5, C5-6, C6-7, C7-T1 Chronic neck pain   Abdominal (+) + obese,   Peds  Hematology   Anesthesia Other Findings   Reproductive/Obstetrics                           Anesthesia Physical Anesthesia Plan  ASA: 2  Anesthesia Plan: General   Post-op Pain Management: Ketamine IV*, Precedex, Tylenol PO (pre-op)* and Dilaudid IV   Induction: Intravenous  PONV Risk Score and Plan: 3 and Treatment may vary due to age or medical condition, Midazolam, Dexamethasone and Ondansetron  Airway Management Planned: Oral ETT and Video Laryngoscope Planned  Additional Equipment: None  Intra-op Plan:   Post-operative Plan: Extubation in OR  Informed Consent: I have reviewed the patients History and Physical, chart, labs and discussed the procedure including the risks, benefits and alternatives for the proposed anesthesia with the patient or authorized representative who has indicated his/her understanding and  acceptance.     Dental advisory given  Plan Discussed with: CRNA and Anesthesiologist  Anesthesia Plan Comments:        Anesthesia Quick Evaluation

## 2021-08-01 NOTE — H&P (Cosign Needed)
Office Visit Note   Patient: Henry Jennings           Date of Birth: January 30, 1970           MRN: [id number] Visit Date: 07/28/2021              Requested by: Joycelyn Rua, MD [address] [place],  [place] [address] PCP: Joycelyn Rua, MD   Assessment & Plan: Visit Diagnoses:  1. Spinal stenosis of cervical region   C4-T1 HNP/stenosis  Plan: We will proceed with C4-5, C5-6, C6-7, C7-T1 ANTERIOR CERVICAL DISCECTOMY AND FUSION WITH PLATES AND SCREWS as scheduled.  Surgical procedure discussed along potential rehab/recovery time.  Advised patient that he can anticipate being in a hard collar for at least 8 weeks postop.  Stressed to him the importance of being compliant with postop instructions due to the increased risk of pseudoarthrosis doing a multilevel fusion.  Patient voices understanding.  Follow-Up Instructions: No follow-ups on file.   Orders:  No orders of the defined types were placed in this encounter.  No orders of the defined types were placed in this encounter.     Procedures: No procedures performed   Clinical Data: No additional findings.   Subjective: Chief Complaint  Patient presents with   Lower Back - Routine Post Op    HPI 51 year old white male with history of C4-T1 HNP/stenosis comes in for prep evaluation.  States that symptoms unchanged from previous visit.  He is wanting to proceed with C4-5, C5-6, C6-7, C7-T1 ANTERIOR CERVICAL DISCECTOMY AND FUSION WITH PLATES AND SCREWS as scheduled.  Today history and physical performed.  Review of systems negative. Review of Systems No current cardiopulmonary GI/GU issues  Objective: Vital Signs: BP 121/86   Pulse 66   Temp 98.4 F (36.9 C)   Wt 131 lb (59.4 kg)   SpO2 98%   BMI 21.14 kg/m   Physical Exam HENT:     Head: Normocephalic and atraumatic.     Nose: Nose normal.  Eyes:     Extraocular Movements: Extraocular movements intact.  Cardiovascular:     Rate and Rhythm: Normal  rate and regular rhythm.     Heart sounds: No murmur heard. Pulmonary:     Effort: Pulmonary effort is normal. No respiratory distress.     Breath sounds: Normal breath sounds.  Abdominal:     General: Bowel sounds are normal.  Neurological:     Mental Status: He is alert and oriented to person, place, and time.  Psychiatric:        Mood and Affect: Mood normal.     Ortho Exam  Specialty Comments:  No specialty comments available.  Imaging: No results found.   PMFS History: Patient Active Problem List   Diagnosis Date Noted   Anxiety 06/06/2021   Bipolar disorder (HCC) 06/06/2021   Chronic fatigue syndrome 06/06/2021   Fibromyalgia 06/06/2021   Constipation 06/06/2021   Insomnia 06/06/2021   Panic disorder 06/06/2021   Gait abnormality 06/06/2021   Neck pain 06/06/2021   Past Medical History:  Diagnosis Date   Depression    Herniated disc, cervical     No family history on file.  Past Surgical History:  Procedure Laterality Date   colonscopy  2021   WISDOM TOOTH EXTRACTION Left 2020   Left lower   Social History   Occupational History   Not on file  Tobacco Use   Smoking status: Never   Smokeless tobacco: Never  Vaping Use   Vaping Use: Never used  Substance and Sexual Activity   Alcohol use: Not Currently   Drug use: Never   Sexual activity: Yes

## 2021-08-02 ENCOUNTER — Inpatient Hospital Stay (HOSPITAL_COMMUNITY): Payer: BC Managed Care – PPO

## 2021-08-02 ENCOUNTER — Inpatient Hospital Stay (HOSPITAL_COMMUNITY)
Admission: RE | Disposition: A | Discharge status: Home Health | Payer: Self-pay | Source: Home / Self Care | Attending: Specialist

## 2021-08-02 ENCOUNTER — Inpatient Hospital Stay (HOSPITAL_COMMUNITY): Payer: BC Managed Care – PPO | Admitting: Anesthesiology

## 2021-08-02 ENCOUNTER — Inpatient Hospital Stay (HOSPITAL_COMMUNITY)
Admission: RE | Admit: 2021-08-02 | Discharge: 2021-08-04 | DRG: 473 | Disposition: A | Discharge status: Home Health | Payer: BC Managed Care – PPO | Attending: Specialist | Admitting: Specialist

## 2021-08-02 ENCOUNTER — Encounter (HOSPITAL_COMMUNITY): Payer: Self-pay | Admitting: Specialist

## 2021-08-02 ENCOUNTER — Other Ambulatory Visit: Payer: Self-pay

## 2021-08-02 DIAGNOSIS — M5020 Other cervical disc displacement, unspecified cervical region: Secondary | ICD-10-CM | POA: Diagnosis not present

## 2021-08-02 DIAGNOSIS — M4712 Other spondylosis with myelopathy, cervical region: Secondary | ICD-10-CM | POA: Diagnosis not present

## 2021-08-02 DIAGNOSIS — M4802 Spinal stenosis, cervical region: Secondary | ICD-10-CM

## 2021-08-02 DIAGNOSIS — M4723 Other spondylosis with radiculopathy, cervicothoracic region: Secondary | ICD-10-CM | POA: Diagnosis present

## 2021-08-02 DIAGNOSIS — Z981 Arthrodesis status: Principal | ICD-10-CM

## 2021-08-02 DIAGNOSIS — M4803 Spinal stenosis, cervicothoracic region: Secondary | ICD-10-CM | POA: Diagnosis not present

## 2021-08-02 DIAGNOSIS — F319 Bipolar disorder, unspecified: Secondary | ICD-10-CM | POA: Diagnosis not present

## 2021-08-02 DIAGNOSIS — F410 Panic disorder [episodic paroxysmal anxiety] without agoraphobia: Secondary | ICD-10-CM | POA: Diagnosis not present

## 2021-08-02 DIAGNOSIS — M797 Fibromyalgia: Secondary | ICD-10-CM | POA: Diagnosis present

## 2021-08-02 DIAGNOSIS — M4722 Other spondylosis with radiculopathy, cervical region: Principal | ICD-10-CM | POA: Diagnosis present

## 2021-08-02 HISTORY — PX: ANTERIOR CERVICAL DECOMP/DISCECTOMY FUSION: SHX1161

## 2021-08-02 LAB — ABO/RH: ABO/RH(D): O POS

## 2021-08-02 SURGERY — ANTERIOR CERVICAL DECOMPRESSION/DISCECTOMY FUSION 3 LEVELS
Anesthesia: General

## 2021-08-02 MED ORDER — SODIUM CHLORIDE 0.9% FLUSH
3.0000 mL | Freq: Two times a day (BID) | INTRAVENOUS | Status: DC
  Administered 2021-08-02 – 2021-08-04 (×4): 3 mL via INTRAVENOUS

## 2021-08-02 MED ORDER — ONDANSETRON HCL 4 MG/2ML IJ SOLN: 4.0000 mg | Freq: Once | INTRAMUSCULAR | Status: DC | PRN

## 2021-08-02 MED ORDER — HYDROMORPHONE HCL 1 MG/ML IJ SOLN
0.2500 mg | INTRAMUSCULAR | Status: DC | PRN
  Administered 2021-08-02: 0.5 mg via INTRAVENOUS
  Administered 2021-08-02: 0.25 mg via INTRAVENOUS
  Administered 2021-08-02: 0.5 mg via INTRAVENOUS
  Administered 2021-08-02: 0.25 mg via INTRAVENOUS

## 2021-08-02 MED ORDER — LIDOCAINE 2% (20 MG/ML) 5 ML SYRINGE
INTRAMUSCULAR | Status: DC | PRN
  Administered 2021-08-02: 60 mg via INTRAVENOUS

## 2021-08-02 MED ORDER — LACTATED RINGERS IV SOLN: INTRAVENOUS | Status: DC

## 2021-08-02 MED ORDER — PROPOFOL 10 MG/ML IV BOLUS
INTRAVENOUS | Status: DC | PRN
  Administered 2021-08-02: 130 mg via INTRAVENOUS

## 2021-08-02 MED ORDER — BUPIVACAINE LIPOSOME 1.3 % IJ SUSP
10.0000 mL | Freq: Once | INTRAMUSCULAR | Status: DC
  Filled 2021-08-02: qty 10

## 2021-08-02 MED ORDER — FENTANYL CITRATE (PF) 250 MCG/5ML IJ SOLN
INTRAMUSCULAR | Status: DC | PRN
  Administered 2021-08-02 (×2): 50 ug via INTRAVENOUS
  Administered 2021-08-02: 100 ug via INTRAVENOUS
  Administered 2021-08-02 (×3): 50 ug via INTRAVENOUS
  Administered 2021-08-02: 100 ug via INTRAVENOUS
  Administered 2021-08-02: 50 ug via INTRAVENOUS

## 2021-08-02 MED ORDER — TURMERIC 500 MG PO CAPS: 800.0000 mg | ORAL_CAPSULE | Freq: Every day | ORAL | Status: DC

## 2021-08-02 MED ORDER — PHENOL 1.4 % MT LIQD: 1.0000 | OROMUCOSAL | Status: DC | PRN

## 2021-08-02 MED ORDER — THROMBIN 20000 UNITS EX KIT
PACK | CUTANEOUS | Status: AC
  Filled 2021-08-02: qty 1

## 2021-08-02 MED ORDER — CHLORHEXIDINE GLUCONATE 0.12 % MT SOLN: 15.0000 mL | Freq: Once | OROMUCOSAL | Status: AC

## 2021-08-02 MED ORDER — VANCOMYCIN HCL IN DEXTROSE 1-5 GM/200ML-% IV SOLN
INTRAVENOUS | Status: AC
  Administered 2021-08-02: 1000 mg via INTRAVENOUS
  Filled 2021-08-02: qty 200

## 2021-08-02 MED ORDER — POLYETHYLENE GLYCOL 3350 17 G PO PACK: 17.0000 g | PACK | Freq: Every day | ORAL | Status: DC | PRN

## 2021-08-02 MED ORDER — ORAL CARE MOUTH RINSE: 15.0000 mL | OROMUCOSAL | Status: DC | PRN

## 2021-08-02 MED ORDER — KETAMINE HCL 10 MG/ML IJ SOLN
INTRAMUSCULAR | Status: DC | PRN
  Administered 2021-08-02: 20 mg via INTRAVENOUS
  Administered 2021-08-02: 30 mg via INTRAVENOUS

## 2021-08-02 MED ORDER — MORPHINE SULFATE (PF) 2 MG/ML IV SOLN
1.0000 mg | INTRAVENOUS | Status: DC | PRN
  Administered 2021-08-02 – 2021-08-04 (×5): 1 mg via INTRAVENOUS
  Filled 2021-08-02 (×5): qty 1

## 2021-08-02 MED ORDER — ALPRAZOLAM 0.25 MG PO TABS: 0.5000 mg | ORAL_TABLET | Freq: Every day | ORAL | Status: DC | PRN

## 2021-08-02 MED ORDER — OXYCODONE HCL 5 MG/5ML PO SOLN: 5.0000 mg | Freq: Once | ORAL | Status: DC | PRN

## 2021-08-02 MED ORDER — DULOXETINE HCL 60 MG PO CPEP
60.0000 mg | ORAL_CAPSULE | Freq: Two times a day (BID) | ORAL | Status: DC
  Administered 2021-08-02 – 2021-08-04 (×4): 60 mg via ORAL
  Filled 2021-08-02 (×4): qty 1

## 2021-08-02 MED ORDER — BISACODYL 5 MG PO TBEC: 5.0000 mg | DELAYED_RELEASE_TABLET | Freq: Every day | ORAL | Status: DC | PRN

## 2021-08-02 MED ORDER — SODIUM CHLORIDE 0.9 % IV SOLN: INTRAVENOUS | Status: DC

## 2021-08-02 MED ORDER — PANTOPRAZOLE SODIUM 40 MG IV SOLR
40.0000 mg | Freq: Every day | INTRAVENOUS | Status: DC
  Administered 2021-08-02: 40 mg via INTRAVENOUS
  Filled 2021-08-02: qty 10

## 2021-08-02 MED ORDER — ACETAMINOPHEN 325 MG PO TABS: 650.0000 mg | ORAL_TABLET | ORAL | Status: DC | PRN

## 2021-08-02 MED ORDER — VITAMIN B-12 1000 MCG PO TABS
1000.0000 ug | ORAL_TABLET | Freq: Every day | ORAL | Status: DC
  Administered 2021-08-02 – 2021-08-04 (×3): 1000 ug via ORAL
  Filled 2021-08-02 (×3): qty 1

## 2021-08-02 MED ORDER — PHENYLEPHRINE HCL-NACL 20-0.9 MG/250ML-% IV SOLN
INTRAVENOUS | Status: DC | PRN
  Administered 2021-08-02: 40 ug/min via INTRAVENOUS

## 2021-08-02 MED ORDER — ONDANSETRON HCL 4 MG/2ML IJ SOLN
INTRAMUSCULAR | Status: DC | PRN
  Administered 2021-08-02: 4 mg via INTRAVENOUS

## 2021-08-02 MED ORDER — BUPIVACAINE HCL 0.5 % IJ SOLN
INTRAMUSCULAR | Status: AC
  Filled 2021-08-02: qty 1

## 2021-08-02 MED ORDER — GABAPENTIN 100 MG PO CAPS
200.0000 mg | ORAL_CAPSULE | Freq: Two times a day (BID) | ORAL | Status: DC
  Administered 2021-08-02 – 2021-08-04 (×4): 200 mg via ORAL
  Filled 2021-08-02 (×4): qty 2

## 2021-08-02 MED ORDER — FENTANYL CITRATE (PF) 250 MCG/5ML IJ SOLN
INTRAMUSCULAR | Status: AC
  Filled 2021-08-02: qty 5

## 2021-08-02 MED ORDER — SODIUM CHLORIDE 0.9 % IV SOLN: 250.0000 mL | INTRAVENOUS | Status: DC

## 2021-08-02 MED ORDER — ALBUMIN HUMAN 5 % IV SOLN
INTRAVENOUS | Status: AC
  Filled 2021-08-02: qty 250

## 2021-08-02 MED ORDER — BUPIVACAINE LIPOSOME 1.3 % IJ SUSP
INTRAMUSCULAR | Status: DC | PRN
  Administered 2021-08-02: 4 mL

## 2021-08-02 MED ORDER — ALBUMIN HUMAN 5 % IV SOLN
12.5000 g | Freq: Once | INTRAVENOUS | Status: AC
  Administered 2021-08-02: 12.5 g via INTRAVENOUS

## 2021-08-02 MED ORDER — DEXAMETHASONE SODIUM PHOSPHATE 10 MG/ML IJ SOLN
INTRAMUSCULAR | Status: DC | PRN
  Administered 2021-08-02: 10 mg via INTRAVENOUS

## 2021-08-02 MED ORDER — MIDAZOLAM HCL 2 MG/2ML IJ SOLN
INTRAMUSCULAR | Status: DC | PRN
  Administered 2021-08-02: 4 mg via INTRAVENOUS

## 2021-08-02 MED ORDER — KETAMINE HCL 50 MG/5ML IJ SOSY
PREFILLED_SYRINGE | INTRAMUSCULAR | Status: AC
  Filled 2021-08-02: qty 5

## 2021-08-02 MED ORDER — DOCUSATE SODIUM 100 MG PO CAPS
100.0000 mg | ORAL_CAPSULE | Freq: Two times a day (BID) | ORAL | Status: DC
  Administered 2021-08-02 – 2021-08-04 (×4): 100 mg via ORAL
  Filled 2021-08-02 (×4): qty 1

## 2021-08-02 MED ORDER — BUPIVACAINE LIPOSOME 1.3 % IJ SUSP
INTRAMUSCULAR | Status: AC
  Filled 2021-08-02: qty 20

## 2021-08-02 MED ORDER — ROCURONIUM BROMIDE 10 MG/ML (PF) SYRINGE
PREFILLED_SYRINGE | INTRAVENOUS | Status: DC | PRN
  Administered 2021-08-02: 20 mg via INTRAVENOUS
  Administered 2021-08-02: 60 mg via INTRAVENOUS
  Administered 2021-08-02: 20 mg via INTRAVENOUS
  Administered 2021-08-02: 40 mg via INTRAVENOUS

## 2021-08-02 MED ORDER — ATOMOXETINE HCL 40 MG PO CAPS
40.0000 mg | ORAL_CAPSULE | Freq: Every day | ORAL | Status: DC
  Administered 2021-08-02 – 2021-08-04 (×3): 40 mg via ORAL
  Filled 2021-08-02 (×4): qty 1

## 2021-08-02 MED ORDER — SODIUM CHLORIDE 0.9% FLUSH: 3.0000 mL | INTRAVENOUS | Status: DC | PRN

## 2021-08-02 MED ORDER — MIDAZOLAM HCL 2 MG/2ML IJ SOLN
INTRAMUSCULAR | Status: AC
  Filled 2021-08-02: qty 2

## 2021-08-02 MED ORDER — ALUM & MAG HYDROXIDE-SIMETH 200-200-20 MG/5ML PO SUSP
30.0000 mL | Freq: Four times a day (QID) | ORAL | Status: DC | PRN
  Administered 2021-08-03 – 2021-08-04 (×3): 30 mL via ORAL
  Filled 2021-08-02 (×3): qty 30

## 2021-08-02 MED ORDER — ONDANSETRON HCL 4 MG/2ML IJ SOLN: 4.0000 mg | Freq: Four times a day (QID) | INTRAMUSCULAR | Status: DC | PRN

## 2021-08-02 MED ORDER — BUPIVACAINE HCL 0.5 % IJ SOLN
INTRAMUSCULAR | Status: DC | PRN
  Administered 2021-08-02: 4 mL

## 2021-08-02 MED ORDER — CHLORHEXIDINE GLUCONATE 0.12 % MT SOLN
OROMUCOSAL | Status: AC
  Administered 2021-08-02: 15 mL via OROMUCOSAL
  Filled 2021-08-02: qty 15

## 2021-08-02 MED ORDER — ONDANSETRON HCL 4 MG PO TABS: 4.0000 mg | ORAL_TABLET | Freq: Four times a day (QID) | ORAL | Status: DC | PRN

## 2021-08-02 MED ORDER — THROMBIN 20000 UNITS EX SOLR: CUTANEOUS | Status: DC | PRN

## 2021-08-02 MED ORDER — MENTHOL 3 MG MT LOZG: 1.0000 | LOZENGE | OROMUCOSAL | Status: DC | PRN

## 2021-08-02 MED ORDER — ACETAMINOPHEN 650 MG RE SUPP: 650.0000 mg | RECTAL | Status: DC | PRN

## 2021-08-02 MED ORDER — HYDROMORPHONE HCL 1 MG/ML IJ SOLN
INTRAMUSCULAR | Status: AC
  Filled 2021-08-02: qty 1

## 2021-08-02 MED ORDER — VANCOMYCIN HCL IN DEXTROSE 1-5 GM/200ML-% IV SOLN: 1000.0000 mg | INTRAVENOUS | Status: AC

## 2021-08-02 MED ORDER — SUGAMMADEX SODIUM 200 MG/2ML IV SOLN
INTRAVENOUS | Status: DC | PRN
  Administered 2021-08-02: 118.8 mg via INTRAVENOUS

## 2021-08-02 MED ORDER — HYDROCODONE-ACETAMINOPHEN 7.5-325 MG PO TABS
2.0000 | ORAL_TABLET | ORAL | Status: DC | PRN
  Administered 2021-08-02 – 2021-08-04 (×6): 2 via ORAL
  Filled 2021-08-02 (×6): qty 2

## 2021-08-02 MED ORDER — EPHEDRINE SULFATE-NACL 50-0.9 MG/10ML-% IV SOSY
PREFILLED_SYRINGE | INTRAVENOUS | Status: DC | PRN
  Administered 2021-08-02: 10 mg via INTRAVENOUS

## 2021-08-02 MED ORDER — METHOCARBAMOL 500 MG PO TABS: 500.0000 mg | ORAL_TABLET | Freq: Four times a day (QID) | ORAL | Status: DC | PRN

## 2021-08-02 MED ORDER — METHOCARBAMOL 1000 MG/10ML IJ SOLN: 500.0000 mg | Freq: Four times a day (QID) | INTRAVENOUS | Status: DC | PRN

## 2021-08-02 MED ORDER — CEFAZOLIN SODIUM-DEXTROSE 2-4 GM/100ML-% IV SOLN
2.0000 g | Freq: Three times a day (TID) | INTRAVENOUS | Status: AC
  Administered 2021-08-02 – 2021-08-03 (×2): 2 g via INTRAVENOUS
  Filled 2021-08-02 (×2): qty 100

## 2021-08-02 MED ORDER — TRAZODONE HCL 50 MG PO TABS: 50.0000 mg | ORAL_TABLET | Freq: Every evening | ORAL | Status: DC | PRN

## 2021-08-02 MED ORDER — 0.9 % SODIUM CHLORIDE (POUR BTL) OPTIME
TOPICAL | Status: DC | PRN
  Administered 2021-08-02: 1000 mL

## 2021-08-02 MED ORDER — OXYCODONE HCL 5 MG PO TABS: 5.0000 mg | ORAL_TABLET | Freq: Once | ORAL | Status: DC | PRN

## 2021-08-02 MED ORDER — PROPOFOL 500 MG/50ML IV EMUL
INTRAVENOUS | Status: DC | PRN
  Administered 2021-08-02: 50 ug/kg/min via INTRAVENOUS

## 2021-08-02 MED ORDER — HYDROCODONE-ACETAMINOPHEN 7.5-325 MG PO TABS
1.0000 | ORAL_TABLET | Freq: Four times a day (QID) | ORAL | Status: DC
  Administered 2021-08-02 – 2021-08-04 (×6): 1 via ORAL
  Filled 2021-08-02 (×6): qty 1

## 2021-08-02 MED ORDER — PROPOFOL 10 MG/ML IV BOLUS
INTRAVENOUS | Status: AC
  Filled 2021-08-02: qty 20

## 2021-08-02 MED ORDER — FLEET ENEMA 7-19 GM/118ML RE ENEM: 1.0000 | ENEMA | Freq: Once | RECTAL | Status: DC | PRN

## 2021-08-02 MED ORDER — ORAL CARE MOUTH RINSE: 15.0000 mL | Freq: Once | OROMUCOSAL | Status: AC

## 2021-08-02 MED ORDER — HYDROCODONE-ACETAMINOPHEN 7.5-325 MG PO TABS
1.0000 | ORAL_TABLET | ORAL | Status: DC | PRN
  Administered 2021-08-03: 1 via ORAL
  Filled 2021-08-02: qty 1

## 2021-08-02 SURGICAL SUPPLY — 71 items
BAG COUNTER SPONGE SURGICOUNT (BAG) ×2 IMPLANT
BENZOIN TINCTURE PRP APPL 2/3 (GAUZE/BANDAGES/DRESSINGS) ×2 IMPLANT
BIT DRILL SRG 14X2.2XFLT CHK (BIT) IMPLANT
BIT DRL SRG 14X2.2XFLT CHK (BIT) ×1
BLADE AVERAGE 25X9 (BLADE) IMPLANT
BLADE CLIPPER SURG (BLADE) IMPLANT
BONE VIVIGEN FORMABLE 1.3CC (Bone Implant) ×2 IMPLANT
BUR RND FLUTED 2.5 (BURR) IMPLANT
BUR SABER RD CUTTING 3.0 (BURR) IMPLANT
COVER SURGICAL LIGHT HANDLE (MISCELLANEOUS) ×2 IMPLANT
DERMABOND ADVANCED (GAUZE/BANDAGES/DRESSINGS) ×1
DERMABOND ADVANCED .7 DNX12 (GAUZE/BANDAGES/DRESSINGS) ×1 IMPLANT
DRAIN TLS ROUND 10FR (DRAIN) IMPLANT
DRAPE C-ARM 42X72 X-RAY (DRAPES) ×2 IMPLANT
DRAPE MICROSCOPE LEICA (MISCELLANEOUS) ×2 IMPLANT
DRAPE POUCH INSTRU U-SHP 10X18 (DRAPES) ×2 IMPLANT
DRAPE SURG 17X23 STRL (DRAPES) ×6 IMPLANT
DRESSING MEPILEX FLEX 4X4 (GAUZE/BANDAGES/DRESSINGS) IMPLANT
DRILL BIT SKYLINE 14MM (BIT) ×2
DRSG MEPILEX BORDER 4X4 (GAUZE/BANDAGES/DRESSINGS) IMPLANT
DRSG MEPILEX FLEX 4X4 (GAUZE/BANDAGES/DRESSINGS) ×2
DURAPREP 6ML APPLICATOR 50/CS (WOUND CARE) ×2 IMPLANT
ELECT BLADE 4.0 EZ CLEAN MEGAD (MISCELLANEOUS)
ELECT COATED BLADE 2.86 ST (ELECTRODE) ×2 IMPLANT
ELECT REM PT RETURN 9FT ADLT (ELECTROSURGICAL) ×2
ELECTRODE BLDE 4.0 EZ CLN MEGD (MISCELLANEOUS) IMPLANT
ELECTRODE REM PT RTRN 9FT ADLT (ELECTROSURGICAL) ×1 IMPLANT
GAUZE SPONGE 4X4 12PLY STRL LF (GAUZE/BANDAGES/DRESSINGS) ×1 IMPLANT
GLOVE BIOGEL PI IND STRL 8 (GLOVE) ×1 IMPLANT
GLOVE BIOGEL PI INDICATOR 8 (GLOVE) ×1
GLOVE ECLIPSE 9.0 STRL (GLOVE) ×2 IMPLANT
GLOVE ORTHO TXT STRL SZ7.5 (GLOVE) ×2 IMPLANT
GLOVE SURG 8.5 LATEX PF (GLOVE) ×2 IMPLANT
GOWN STRL REUS W/ TWL LRG LVL3 (GOWN DISPOSABLE) ×1 IMPLANT
GOWN STRL REUS W/TWL 2XL LVL3 (GOWN DISPOSABLE) ×4 IMPLANT
GOWN STRL REUS W/TWL LRG LVL3 (GOWN DISPOSABLE) ×2
GRAFT BNE MATRIX VG FRMBL SM 1 (Bone Implant) IMPLANT
HALTER HD/CHIN CERV TRACTION D (MISCELLANEOUS) ×2 IMPLANT
KIT BASIN OR (CUSTOM PROCEDURE TRAY) ×2 IMPLANT
KIT TURNOVER KIT B (KITS) ×2 IMPLANT
MANIFOLD NEPTUNE II (INSTRUMENTS) ×2 IMPLANT
NDL SPNL 20GX3.5 QUINCKE YW (NEEDLE) ×2 IMPLANT
NEEDLE SPNL 20GX3.5 QUINCKE YW (NEEDLE) ×4 IMPLANT
NS IRRIG 1000ML POUR BTL (IV SOLUTION) ×2 IMPLANT
PACK ORTHO CERVICAL (CUSTOM PROCEDURE TRAY) ×2 IMPLANT
PAD ARMBOARD 7.5X6 YLW CONV (MISCELLANEOUS) ×4 IMPLANT
PATTIES SURGICAL .5 X.5 (GAUZE/BANDAGES/DRESSINGS) IMPLANT
PIN DISTRACTION 14MM (PIN) ×4 IMPLANT
PIN DISTRACTION 16MM (PIN) IMPLANT
PIN TEMP SKYLINE THREADED (PIN) ×2 IMPLANT
PLATE SKYLINE 60MM 4LVL (Plate) ×1 IMPLANT
RESTRAINT LIMB HOLDER UNIV (RESTRAINTS) ×2 IMPLANT
SCREW SKYLINE VAR OS 14MM (Screw) ×1 IMPLANT
SCREW VAR SELF TAP SKYLINE 14M (Screw) ×10 IMPLANT
SPACER ACF 7MM (Bone Implant) ×1 IMPLANT
SPACER LORDOTIC ADV-ACF 6MM (Bone Implant) ×3 IMPLANT
SPONGE INTESTINAL PEANUT (DISPOSABLE) ×1 IMPLANT
SPONGE SURGIFOAM ABS GEL 100 (HEMOSTASIS) IMPLANT
SPONGE T-LAP 4X18 ~~LOC~~+RFID (SPONGE) IMPLANT
STRIP CLOSURE SKIN 1/2X4 (GAUZE/BANDAGES/DRESSINGS) ×2 IMPLANT
SUT ETHILON 4 0 PS 2 18 (SUTURE) ×1 IMPLANT
SUT VIC AB 2-0 CT1 27 (SUTURE) ×2
SUT VIC AB 2-0 CT1 TAPERPNT 27 (SUTURE) ×1 IMPLANT
SUT VIC AB 3-0 X1 27 (SUTURE) ×4 IMPLANT
SUT VICRYL 4-0 PS2 18IN ABS (SUTURE) IMPLANT
SYR 20ML LL LF (SYRINGE) ×2 IMPLANT
SYR 30ML LL (SYRINGE) ×2 IMPLANT
SYSTEM CHEST DRAIN TLS 7FR (DRAIN) IMPLANT
TOWEL GREEN STERILE (TOWEL DISPOSABLE) ×2 IMPLANT
TOWEL GREEN STERILE FF (TOWEL DISPOSABLE) ×2 IMPLANT
TRAY FOLEY MTR SLVR 16FR STAT (SET/KITS/TRAYS/PACK) ×2 IMPLANT

## 2021-08-02 NOTE — Discharge Instructions (Signed)
No lifting greater than 10 lbs. No overhead use of arms. °Avoid bending,and twisting neck. °Walk in house for first week them may start to get out slowly increasing distance up to one mile by 3 weeks post op. °Keep incision dry for 3 days, may then bathe and wet incision using a [place] collar when showering. °Call if any fevers >101, chills, or increasing numbness or weakness or increased swelling or drainage. ° °

## 2021-08-02 NOTE — Brief Op Note (Signed)
08/02/2021  1:17 PM  PATIENT:  Henry Jennings  51 y.o. male  PRE-OPERATIVE DIAGNOSIS:  cervical stenosis and spondylosis with radiculopathy C4-5, C5-6, C6-7, C7-T1  POST-OPERATIVE DIAGNOSIS:  cervical stenosis and spondylosis with radiculopathy C4-5, C5-6, C6-7, C7-T1  PROCEDURE:  Procedure(s): C4-5, C5-6, C6-7, C7-T1 ANTERIOR CERVICAL DISCECTOMY AND FUSION WITH PLATES AND SCREWS (N/A)  SURGEON:  Surgeon(s) and Role:    * Kerrin Champagne, MD - Primary  PHYSICIAN ASSISTANT: Zonia Kief, PA-C  ANESTHESIA:   local and general  EBL:  100 mL   BLOOD ADMINISTERED:none  DRAINS: (Small x 1) Hemovact drain(s) in the left anterior neck with  Suction Clamped and Urinary Catheter (Foley)   LOCAL MEDICATIONS USED:  MARCAINE 0.5% 1:1 EXPAREL 1.3%  Amount: 10 ml  SPECIMEN:  No Specimen  DISPOSITION OF SPECIMEN:  N/A  COUNTS:  YES  TOURNIQUET:  * No tourniquets in log *  DICTATION: .Dragon Dictation  PLAN OF CARE: Admit to inpatient   PATIENT DISPOSITION:  PACU - hemodynamically stable.   Delay start of Pharmacological VTE agent (>24hrs) due to surgical blood loss or risk of bleeding: yes

## 2021-08-02 NOTE — Anesthesia Procedure Notes (Signed)
Procedure Name: Intubation Date/Time: 08/02/2021 8:10 AM  Performed by: Minerva Ends, CRNAPre-anesthesia Checklist: Patient identified, Emergency Drugs available, Suction available and Patient being monitored Patient Re-evaluated:Patient Re-evaluated prior to induction Oxygen Delivery Method: Circle system utilized Preoxygenation: Pre-oxygenation with 100% oxygen Induction Type: IV induction Ventilation: Mask ventilation without difficulty Laryngoscope Size: Mac, 3 and Glidescope Grade View: Grade II Tube type: Oral Tube size: 7.0 mm Number of attempts: 1 Airway Equipment and Method: Stylet and Oral airway Placement Confirmation: ETT inserted through vocal cords under direct vision, positive ETCO2 and breath sounds checked- equal and bilateral Secured at: 23 cm Tube secured with: Tape Dental Injury: Teeth and Oropharynx as per pre-operative assessment

## 2021-08-02 NOTE — Anesthesia Postprocedure Evaluation (Signed)
Anesthesia Post Note  Patient: Henry Jennings  Procedure(s) Performed: C4-5, C5-6, C6-7, C7-T1 ANTERIOR CERVICAL DISCECTOMY AND FUSION WITH PLATES AND SCREWS     Patient location during evaluation: PACU Anesthesia Type: General Level of consciousness: awake and alert and oriented Pain management: pain level controlled Vital Signs Assessment: post-procedure vital signs reviewed and stable Respiratory status: spontaneous breathing, nonlabored ventilation, respiratory function stable and patient connected to nasal cannula oxygen Cardiovascular status: blood pressure returned to baseline and stable Postop Assessment: no apparent nausea or vomiting Anesthetic complications: no   No notable events documented.  Last Vitals:  Vitals:   08/02/21 1425 08/02/21 1440  BP: 133/78 133/86  Pulse: (!) 128 (!) 116  Resp: 17 17  Temp:    SpO2: 97% 90%    Last Pain:  Vitals:   08/02/21 1425  TempSrc:   PainSc: Asleep                 Palmer Shorey A.

## 2021-08-02 NOTE — Progress Notes (Signed)
Orthopedic Tech Progress Note Patient Details:  Henry Jennings 1970-01-14 [id number]   OR RN called requesting a PHILLY COLLAR, and PACU RN called requesting an ASPEN CERVICAL COLLAR, so I called in that order into [facility]    Ortho Devices Type of Ortho Device: Philadelphia cervical collar Ortho Device/Splint Location: NECK Ortho Device/Splint Interventions: Ordered   Post Interventions Patient Tolerated: Well Instructions Provided: Care of device  [name] 08/02/2021, 2:46 PM

## 2021-08-02 NOTE — Transfer of Care (Signed)
Immediate Anesthesia Transfer of Care Note  Patient: Henry Jennings  Procedure(s) Performed: C4-5, C5-6, C6-7, C7-T1 ANTERIOR CERVICAL DISCECTOMY AND FUSION WITH PLATES AND SCREWS  Patient Location: PACU  Anesthesia Type:General  Level of Consciousness: awake, alert  and oriented  Airway & Oxygen Therapy: Patient Spontanous Breathing  Post-op Assessment: Report given to RN and Post -op Vital signs reviewed and stable  Post vital signs: Reviewed and stable  Last Vitals:  Vitals Value Taken Time  BP 124/80 08/02/21 1353  Temp    Pulse 123 08/02/21 1356  Resp 17 08/02/21 1356  SpO2 100 % 08/02/21 1356  Vitals shown include unvalidated device data.  Last Pain:  Vitals:   08/02/21 0600  TempSrc:   PainSc: 6          Complications: No notable events documented.

## 2021-08-02 NOTE — Interval H&P Note (Signed)
History and Physical Interval Note:  08/02/2021 7:37 AM  Henry Jennings  has presented today for surgery, with the diagnosis of cervical stenosis and spondylosis with radiculopathy C4-5, C5-6, C6-7, C7-T1.  The various methods of treatment have been discussed with the patient and family. After consideration of risks, benefits and other options for treatment, the patient has consented to  Procedure(s): C4-5, C5-6, C6-7, C7-T1 ANTERIOR CERVICAL DISCECTOMY AND FUSION WITH PLATES AND SCREWS (N/A) as a surgical intervention.  The patient's history has been reviewed, patient examined, no change in status, stable for surgery.  I have reviewed the patient's chart and labs.  Questions were answered to the patient's satisfaction.     Vira Browns

## 2021-08-03 ENCOUNTER — Encounter (HOSPITAL_COMMUNITY): Payer: Self-pay | Admitting: Specialist

## 2021-08-03 LAB — CBC
HCT: 40.2 % (ref 39.0–52.0)
Hemoglobin: 13.3 g/dL (ref 13.0–17.0)
MCH: 30.3 pg (ref 26.0–34.0)
MCHC: 33.1 g/dL (ref 30.0–36.0)
MCV: 91.6 fL (ref 80.0–100.0)
Platelets: 165 10*3/uL (ref 150–400)
RBC: 4.39 MIL/uL (ref 4.22–5.81)
RDW: 12.6 % (ref 11.5–15.5)
WBC: 13.4 10*3/uL — ABNORMAL HIGH (ref 4.0–10.5)
nRBC: 0 % (ref 0.0–0.2)

## 2021-08-03 MED ORDER — PANTOPRAZOLE SODIUM 40 MG PO TBEC
40.0000 mg | DELAYED_RELEASE_TABLET | Freq: Every day | ORAL | Status: DC
  Administered 2021-08-03: 40 mg via ORAL
  Filled 2021-08-03: qty 1

## 2021-08-03 MED FILL — Thrombin For Soln Kit 20000 Unit: CUTANEOUS | Qty: 1 | Status: AC

## 2021-08-03 NOTE — Progress Notes (Signed)
PT Cancellation Note  Patient Details Name: Henry Jennings MRN: [id number] DOB: 08-11-1970   Cancelled Treatment:    Reason Eval/Treat Not Completed: PT screened, no needs identified, will sign off Per OT, patient is independent with mobility. All education completed by OT. No skilled PT needs identified acutely. PT will sign off.   Georgina Krist A. Dan Humphreys PT, DPT Acute Rehabilitation Services Office [phone]    Viviann Spare 08/03/2021, 11:09 AM

## 2021-08-03 NOTE — Progress Notes (Signed)
Subjective: 1 Day Post-Op Procedure(s) (LRB): C4-5, C5-6, C6-7, C7-T1 ANTERIOR CERVICAL DISCECTOMY AND FUSION WITH PLATES AND SCREWS (N/A) Awake, alert and oriented x 4. Moderate sore throat, clear liquids to soft diet. Voiding well. Not dropping items with hands today. No numbness or  Patient reports pain as moderate.    Objective:   VITALS:  Temp:  [97.5 F (36.4 C)-98.8 F (37.1 C)] 97.5 F (36.4 C) (08/02 1436) Pulse Rate:  [84-120] 92 (08/02 1436) Resp:  [15-18] 16 (08/02 0753) BP: (113-137)/(74-97) 136/96 (08/02 1436) SpO2:  [91 %-99 %] 98 % (08/02 1436)  Neurologically intact ABD soft Neurovascular intact Sensation intact distally Intact pulses distally Dorsiflexion/Plantar flexion intact Incision: dressing C/D/I and no drainage Hemovac drain with moderate drainage, continue the drea   LABS Recent Labs    08/03/21 0451  HGB 13.3  WBC 13.4*  PLT 165   No results for input(s): "NA", "K", "CL", "CO2", "BUN", "CREATININE", "GLUCOSE" in the last 72 hours. No results for input(s): "LABPT", "INR" in the last 72 hours.   Assessment/Plan: 1 Day Post-Op Procedure(s) (LRB): C4-5, C5-6, C6-7, C7-T1 ANTERIOR CERVICAL DISCECTOMY AND FUSION WITH PLATES AND SCREWS (N/A)  Advance diet Up with therapy D/C IV fluids Leave drain today Change dressing tomorrow and remove hemovac then, may be ready for discharge at that time.  Henry Jennings 08/03/2021, 4:49 PM Patient ID: Henry Jennings, male   DOB: January 13, 1970, 51 y.o.   MRN: [id number]

## 2021-08-03 NOTE — Evaluation (Signed)
Occupational Therapy Evaluation Patient Details Name: Henry Jennings MRN: [id number] DOB: 1970-08-12 Today's Date: 08/03/2021   History of Present Illness Pt is a 51 yo male s/p C4-5, C5-6, C6-7, C7-T1 ACDF. PMHx: cervical stenosis, bipolar, fibromyalgia.   Clinical Impression   Pt PTA: Pt living with spouse in 1L home with 2 STE. Pt reports independence with ADL and occasional weakness/pain in BLEs and needing to rest. Pt was abmbulating independently. Pt currently supervisionA advised for all OOB ADL and mobility. Pt's spouse works from home and plans to assist as needed. Pt able to perform figure 4 technique for all lower body ADL tasks. Pt standing at sink for grooming with no LOB episodes. Pt ambulating in room, hallway and managing 2 steps up and down in 2 trials with 1 handheldA. Spouse in room. VSS. Pt reporting slight dizziness, but BP stable 118/86 sitting upright. Neck handout provided and reviewed ADL in detail. Pt educated on: donning/doffing and cleaning of hard collar; set an alarm at night for medication, avoid sitting for long periods of time, correct bed positioning for sleeping, correct sequence for bed mobility, avoiding lifting more than 5 pounds and never wash directly over incision. All education is complete and patient indicates understanding. Pt does not require continued acute care OT services. OT signing off. Thank you.      Recommendations for follow up therapy are one component of a multi-disciplinary discharge planning process, led by the attending physician.  Recommendations may be updated based on patient status, additional functional criteria and insurance authorization.   Follow Up Recommendations  Follow physician's recommendations for discharge plan and follow up therapies    Assistance Recommended at Discharge Set up Supervision/Assistance  Patient can return home with the following A little help with walking and/or transfers;A little help with  bathing/dressing/bathroom;Assist for transportation;Help with stairs or ramp for entrance    Functional Status Assessment  Patient has had a recent decline in their functional status and demonstrates the ability to make significant improvements in function in a reasonable and predictable amount of time.  Equipment Recommendations  BSC/3in1;Tub/shower seat    Recommendations for Other Services       Precautions / Restrictions Precautions Precautions: Fall;Cervical Precaution Booklet Issued: Yes (comment) Precaution Comments: verbally discussed precautions; pt aware of BLT and no arching. Required Braces or Orthoses: Cervical Brace Cervical Brace: Hard collar Restrictions Weight Bearing Restrictions: No      Mobility Bed Mobility Overal bed mobility: Needs Assistance Bed Mobility: Rolling, Sidelying to Sit Rolling: Supervision Sidelying to sit: Supervision       General bed mobility comments: log roll technique education    Transfers Overall transfer level: Modified independent Equipment used: None               General transfer comment: spouse provides supervisionA      Balance Overall balance assessment: Mild deficits observed, not formally tested                                         ADL either performed or assessed with clinical judgement   ADL Overall ADL's : At baseline                                       General ADL Comments: SupervisionA advised for all OOB ADL  and mobility. Pt's spouse works from home and plans to assist as needed. Pt able to perform figure 4 technique for all lower body ADL tasks. Pt standing at sink for grooming with no LOB episodes. Pt ambulating in room, hallway and managing 2 steps up and down in 2 trials with 1 handheldA.     Vision Baseline Vision/History: 1 Wears glasses Ability to See in Adequate Light: 0 Adequate Patient Visual Report: No change from baseline Vision Assessment?: No  apparent visual deficits     Perception     Praxis      Pertinent Vitals/Pain Pain Assessment Pain Assessment: 0-10 Pain Score: 5  Pain Location: neck Pain Descriptors / Indicators: Dull, Aching, Sore Pain Intervention(s): Monitored during session     Hand Dominance Right   Extremity/Trunk Assessment Upper Extremity Assessment Upper Extremity Assessment: RUE deficits/detail RUE Deficits / Details: AROM, WFLs and strength has returned 4/5 strength   Lower Extremity Assessment Lower Extremity Assessment: Overall WFL for tasks assessed   Cervical / Trunk Assessment Cervical / Trunk Assessment: Neck Surgery   Communication Communication Communication: No difficulties   Cognition Arousal/Alertness: Awake/alert Behavior During Therapy: WFL for tasks assessed/performed Overall Cognitive Status: Within Functional Limits for tasks assessed                                       General Comments  Spouse in room. VSS. Pt reporting slight dizziness, but BP stable 118/86 sitting upright.    Exercises     Shoulder Instructions      Home Living Family/patient expects to be discharged to:: Private residence Living Arrangements: Spouse/significant other Available Help at Discharge: Family;Available 24 hours/day Type of Home: House Home Access: Stairs to enter [employer] of Steps: 2 Entrance Stairs-Rails: None Home Layout: One level     Bathroom Shower/Tub: Chief Strategy Officer: Standard     Home Equipment: None          Prior Functioning/Environment Prior Level of Function : Independent/Modified Independent             Mobility Comments: no AD, but had days that were debility ADLs Comments: Independent        OT Problem List: Decreased activity tolerance;Pain      OT Treatment/Interventions:      OT Goals(Current goals can be found in the care plan section) Acute Rehab OT Goals Patient Stated Goal: to go  homer OT Goal Formulation: All assessment and education complete, DC therapy Potential to Achieve Goals: Good  OT Frequency:      Co-evaluation              AM-PAC OT "6 Clicks" Daily Activity     Outcome Measure Help from another person eating meals?: None Help from another person taking care of personal grooming?: A Little Help from another person toileting, which includes using toliet, bedpan, or urinal?: A Little Help from another person bathing (including washing, rinsing, drying)?: A Little Help from another person to put on and taking off regular upper body clothing?: A Little Help from another person to put on and taking off regular lower body clothing?: A Little 6 Click Score: 19   End of Session Equipment Utilized During Treatment: Gait belt;Cervical collar Nurse Communication: Mobility status  Activity Tolerance: Patient tolerated treatment well;Patient limited by pain Patient left: in chair;with call bell/phone within reach;with family/visitor present  OT Visit  Diagnosis: Unsteadiness on feet (R26.81);Pain Pain - part of body:  (neck)                Time: 1027-2536 OT Time Calculation (min): 48 min Charges:  OT General Charges $OT Visit: 1 Visit OT Evaluation $OT Eval Moderate Complexity: 1 Mod OT Treatments $Self Care/Home Management : 8-22 mins $Therapeutic Activity: 8-22 mins  Flora Lipps, OTR/L Acute Rehabilitation Service Office: [phone]    Corsica Franson C 08/03/2021, 12:27 PM

## 2021-08-03 NOTE — Progress Notes (Signed)
Mobility Specialist Progress Note   08/03/21 1555  Mobility  Activity Ambulated independently in hallway  Level of Assistance Independent after set-up  Assistive Device None  Distance Ambulated (ft) 550 ft  Activity Response Tolerated well  $Mobility charge 1 Mobility   Received pt in bed c/o 4/10 pain and fatigued but agreeable. Pt slightly impulsive getting OOB but was asymptomatic throughout ambulation and returned to room w/o fault. Left standing in room w/ call bell in reach and all needs met.   Holland Falling Mobility Specialist [facility] #:  [phone] Acute Rehab Office:  [phone]

## 2021-08-04 MED ORDER — METHOCARBAMOL 500 MG PO TABS: 500.0000 mg | ORAL_TABLET | Freq: Four times a day (QID) | ORAL | 1 refills | Status: DC | PRN

## 2021-08-04 MED ORDER — HYDROCODONE-ACETAMINOPHEN 7.5-325 MG PO TABS: 2.0000 | ORAL_TABLET | ORAL | 0 refills | Status: DC | PRN

## 2021-08-04 MED ORDER — DOCUSATE SODIUM 100 MG PO CAPS: 100.0000 mg | ORAL_CAPSULE | Freq: Two times a day (BID) | ORAL | 0 refills | Status: DC

## 2021-08-04 MED ORDER — PHENOL 1.4 % MT LIQD: 1.0000 | OROMUCOSAL | 0 refills | Status: DC | PRN

## 2021-08-04 NOTE — Progress Notes (Signed)
Subjective: 2 Days Post-Op Procedure(s) (LRB): C4-5, C5-6, C6-7, C7-T1 ANTERIOR CERVICAL DISCECTOMY AND FUSION WITH PLATES AND SCREWS (N/A) Awake alert and oriented x 4. Ambulating in room, arms and legs NV normal.  Hemovac left neck discontinued today, tolerating pos.  Patient reports pain as moderate.    Objective:   VITALS:  Temp:  [97.5 F (36.4 C)-98.5 F (36.9 C)] 98.2 F (36.8 C) (08/03 0753) Pulse Rate:  [92-99] 92 (08/03 0753) Resp:  [17-18] 17 (08/03 0753) BP: (129-140)/(86-97) 129/97 (08/03 0753) SpO2:  [98 %-100 %] 100 % (08/03 0753)  Neurologically intact ABD soft Neurovascular intact Sensation intact distally Intact pulses distally Dorsiflexion/Plantar flexion intact Incision: dressing C/D/I and no drainage   LABS Recent Labs    08/03/21 0451  HGB 13.3  WBC 13.4*  PLT 165   No results for input(s): "NA", "K", "CL", "CO2", "BUN", "CREATININE", "GLUCOSE" in the last 72 hours. No results for input(s): "LABPT", "INR" in the last 72 hours.   Assessment/Plan: 2 Days Post-Op Procedure(s) (LRB): C4-5, C5-6, C6-7, C7-T1 ANTERIOR CERVICAL DISCECTOMY AND FUSION WITH PLATES AND SCREWS (N/A)  Advance diet Up with therapy Discharge home with home health  Henry Jennings 08/04/2021, 9:28 AM Patient ID: Henry Jennings, male   DOB: 1970/04/14, 51 y.o.   MRN: [id number]

## 2021-08-08 DIAGNOSIS — F331 Major depressive disorder, recurrent, moderate: Secondary | ICD-10-CM | POA: Diagnosis not present

## 2021-08-08 DIAGNOSIS — F411 Generalized anxiety disorder: Secondary | ICD-10-CM | POA: Diagnosis not present

## 2021-08-11 ENCOUNTER — Other Ambulatory Visit: Payer: Self-pay | Admitting: Specialist

## 2021-08-11 MED ORDER — HYDROCODONE-ACETAMINOPHEN 7.5-325 MG PO TABS: 1.0000 | ORAL_TABLET | Freq: Four times a day (QID) | ORAL | 0 refills | Status: DC | PRN

## 2021-08-11 NOTE — Telephone Encounter (Signed)
Patient would like a refill HYDROcodone-acetaminophen (NORCO) 7.5-325 MG tablet, please advise

## 2021-08-15 DIAGNOSIS — F4312 Post-traumatic stress disorder, chronic: Secondary | ICD-10-CM | POA: Diagnosis not present

## 2021-08-15 DIAGNOSIS — F902 Attention-deficit hyperactivity disorder, combined type: Secondary | ICD-10-CM | POA: Diagnosis not present

## 2021-08-15 DIAGNOSIS — F332 Major depressive disorder, recurrent severe without psychotic features: Secondary | ICD-10-CM | POA: Diagnosis not present

## 2021-08-15 DIAGNOSIS — F411 Generalized anxiety disorder: Secondary | ICD-10-CM | POA: Diagnosis not present

## 2021-08-15 DIAGNOSIS — F331 Major depressive disorder, recurrent, moderate: Secondary | ICD-10-CM | POA: Diagnosis not present

## 2021-08-16 ENCOUNTER — Ambulatory Visit (INDEPENDENT_AMBULATORY_CARE_PROVIDER_SITE_OTHER): Payer: BC Managed Care – PPO | Admitting: Surgery

## 2021-08-16 ENCOUNTER — Ambulatory Visit: Payer: Self-pay

## 2021-08-16 DIAGNOSIS — Z981 Arthrodesis status: Secondary | ICD-10-CM

## 2021-08-16 DIAGNOSIS — M4802 Spinal stenosis, cervical region: Secondary | ICD-10-CM

## 2021-08-19 ENCOUNTER — Telehealth: Payer: Self-pay | Admitting: Surgery

## 2021-08-19 ENCOUNTER — Other Ambulatory Visit: Payer: Self-pay | Admitting: Surgery

## 2021-08-19 MED ORDER — DOCUSATE SODIUM 100 MG PO CAPS: 100.0000 mg | ORAL_CAPSULE | Freq: Every day | ORAL | 2 refills | Status: DC | PRN

## 2021-08-19 MED ORDER — HYDROCODONE-ACETAMINOPHEN 7.5-325 MG PO TABS: 1.0000 | ORAL_TABLET | Freq: Four times a day (QID) | ORAL | 0 refills | Status: DC | PRN

## 2021-08-19 NOTE — Telephone Encounter (Signed)
Please advise. Patient is status post C4-5, C5-6, C6-7, C7-T1 fusions on 08/02/2021-Dr. Nitka.

## 2021-08-19 NOTE — Telephone Encounter (Signed)
Pt called requesting surgery refills of docusate and hydrocodone. Please send to [facility] on [address] . Pt phone number is [phone].

## 2021-08-19 NOTE — Telephone Encounter (Signed)
I called patient and advised. 

## 2021-08-22 DIAGNOSIS — F411 Generalized anxiety disorder: Secondary | ICD-10-CM | POA: Diagnosis not present

## 2021-08-22 DIAGNOSIS — F331 Major depressive disorder, recurrent, moderate: Secondary | ICD-10-CM | POA: Diagnosis not present

## 2021-08-26 NOTE — Discharge Summary (Signed)
Patient ID: Henry Jennings MRN: [id number] DOB/AGE: March 11, 1970 51 y.o.  Admit date: 08/02/2021 Discharge date: 08/04/2021  Admission Diagnoses:  Principal Problem:   S/P cervical spinal fusion   Discharge Diagnoses:  Principal Problem:   S/P cervical spinal fusion  status post Procedure(s): C4-5, C5-6, C6-7, C7-T1 ANTERIOR CERVICAL DISCECTOMY AND FUSION WITH PLATES AND SCREWS  Past Medical History:  Diagnosis Date   Depression    Herniated disc, cervical     Surgeries: Procedure(s): C4-5, C5-6, C6-7, C7-T1 ANTERIOR CERVICAL DISCECTOMY AND FUSION WITH PLATES AND SCREWS on 08/02/2021   Consultants:   Discharged Condition: Improved  Hospital Course: Henry Jennings is an 51 y.o. male who was admitted 08/02/2021 for operative treatment of S/P cervical spinal fusion. Patient failed conservative treatments (please see the history and physical for the specifics) and had severe unremitting pain that affects sleep, daily activities and work/hobbies. After pre-op clearance, the patient was taken to the operating room on 08/02/2021 and underwent  Procedure(s): C4-5, C5-6, C6-7, C7-T1 ANTERIOR CERVICAL DISCECTOMY AND FUSION WITH PLATES AND SCREWS.    Patient was given perioperative antibiotics:  Anti-infectives (From admission, onward)    Start     Dose/Rate Route Frequency Ordered Stop   08/02/21 1830  ceFAZolin (ANCEF) IVPB 2g/100 mL premix        2 g 200 mL/hr over 30 Minutes Intravenous Every 8 hours 08/02/21 1736 08/03/21 0418   08/02/21 0600  vancomycin (VANCOCIN) IVPB 1000 mg/200 mL premix        1,000 mg 200 mL/hr over 60 Minutes Intravenous On call to O.R. 08/02/21 0547 08/02/21 1813        Patient was given sequential compression devices and early ambulation to prevent DVT.   Patient benefited maximally from hospital stay and there were no complications. At the time of discharge, the patient was urinating/moving their bowels without difficulty, tolerating a regular diet, pain  is controlled with oral pain medications and they have been cleared by PT/OT.   Recent vital signs: No data found.   Recent laboratory studies: No results for input(s): "WBC", "HGB", "HCT", "PLT", "NA", "K", "CL", "CO2", "BUN", "CREATININE", "GLUCOSE", "INR", "CALCIUM" in the last 72 hours.  Invalid input(s): "PT", "2"   Discharge Medications:   Allergies as of 08/04/2021       Reactions   Levofloxacin    Other reaction(s): rash,muscle ach   Metronidazole    Other reaction(s): rash, muscle ache   Penicillins Rash   Other reaction(s): Other (See Comments) Known; from childhood        Medication List     STOP taking these medications    diclofenac 75 MG EC tablet Commonly known as: VOLTAREN       TAKE these medications    ALPRAZolam 0.5 MG 24 hr tablet Commonly known as: XANAX XR Take 0.5 mg by mouth daily as needed for anxiety.   atomoxetine 40 MG capsule Commonly known as: STRATTERA Take 40 mg by mouth daily.   cyanocobalamin 1000 MCG tablet Commonly known as: VITAMIN B12 Take 1,000 mcg by mouth daily.   docusate sodium 100 MG capsule Commonly known as: COLACE Take 1 capsule (100 mg total) by mouth 2 (two) times daily.   DULoxetine 60 MG capsule Commonly known as: CYMBALTA Take 60 mg by mouth 2 (two) times daily.   gabapentin 100 MG capsule Commonly known as: NEURONTIN Take 2 capsules (200 mg total) by mouth 2 (two) times daily.   methocarbamol 500 MG tablet Commonly  known as: ROBAXIN Take 1 tablet (500 mg total) by mouth every 6 (six) hours as needed for muscle spasms.   OVER THE COUNTER MEDICATION Take 500 mg by mouth daily. Lions MANE   phenol 1.4 % Liqd Commonly known as: CHLORASEPTIC Use as directed 1 spray in the mouth or throat as needed for throat irritation / pain.   QC TUMERIC COMPLEX PO Take 800 mg by mouth daily.   traZODone 50 MG tablet Commonly known as: DESYREL Take 50-150 mg by mouth at bedtime as needed for sleep.   UNABLE  TO FIND Take 100 mg by mouth 3 (three) times daily. Med Name: CBD 300 mg        Diagnostic Studies: DG Cervical Spine 2 or 3 views  Result Date: 08/02/2021 CLINICAL DATA:  C4 through T1 ACDF.  Intraoperative fluoroscopy. EXAM: CERVICAL SPINE - 2-3 VIEW COMPARISON:  Cervical spine radiographs 03/25/2021 FINDINGS: Images were performed intraoperatively without the presence of a radiologist. On the first image, markers overlie the anterior C4-5 and anterior C6-7 disc levels. On the second image, there is new C4 through T1 anterior plate and screws with associated intervertebral disc spacers. No hardware complication is seen. Total fluoroscopy images: 5 images Total fluoroscopy time: 10 seconds Total dose: Radiation Exposure Index (as provided by the fluoroscopic device): 1.45 mGy air Kerma Please see intraoperative findings for further detail. IMPRESSION: Intraoperative fluoroscopy for C4 through T1 ACDF. Electronically Signed   By: Neita Garnet M.D.   On: 08/02/2021 13:50   DG C-Arm 1-60 Min-No Report  Result Date: 08/02/2021 Fluoroscopy was utilized by the requesting physician.  No radiographic interpretation.   DG C-Arm 1-60 Min-No Report  Result Date: 08/02/2021 Fluoroscopy was utilized by the requesting physician.  No radiographic interpretation.   DG C-Arm 1-60 Min-No Report  Result Date: 08/02/2021 Fluoroscopy was utilized by the requesting physician.  No radiographic interpretation.   DG C-Arm 1-60 Min-No Report  Result Date: 08/02/2021 Fluoroscopy was utilized by the requesting physician.  No radiographic interpretation.    Discharge Instructions     Call MD / Call 911   Complete by: As directed    If you experience chest pain or shortness of breath, CALL 911 and be transported to the hospital emergency room.  If you develope a fever above 101 F, pus (white drainage) or increased drainage or redness at the wound, or calf pain, call your surgeon's office.   Constipation Prevention    Complete by: As directed    Drink plenty of fluids.  Prune juice may be helpful.  You may use a stool softener, such as Colace (over the counter) 100 mg twice a day.  Use MiraLax (over the counter) for constipation as needed.   Diet - low sodium heart healthy   Complete by: As directed    Discharge instructions   Complete by: As directed    No lifting greater than 10 lbs. No overhead use of arms. Avoid bending,and twisting neck. Walk in house for first week them may start to get out slowly increasing distance up to one mile by 3 weeks post op. Keep incision dry for 3 days, may then bathe and wet incision using a [place] collar when showering. Call if any fevers >101, chills, or increasing numbness or weakness or increased swelling or drainage.   Driving restrictions   Complete by: As directed    No driving for 3 weeks   Increase activity slowly as tolerated   Complete by: As directed  Lifting restrictions   Complete by: As directed    No lifting for 8 weeks   Post-operative opioid taper instructions:   Complete by: As directed    POST-OPERATIVE OPIOID TAPER INSTRUCTIONS: It is important to wean off of your opioid medication as soon as possible. If you do not need pain medication after your surgery it is ok to stop day one. Opioids include: Codeine, Hydrocodone(Norco, Vicodin), Oxycodone(Percocet, oxycontin) and hydromorphone amongst others.  Long term and even short term use of opiods can cause: Increased pain response Dependence Constipation Depression Respiratory depression And more.  Withdrawal symptoms can include Flu like symptoms Nausea, vomiting And more Techniques to manage these symptoms Hydrate well Eat regular healthy meals Stay active Use relaxation techniques(deep breathing, meditating, yoga) Do Not substitute Alcohol to help with tapering If you have been on opioids for less than two weeks and do not have pain than it is ok to stop all together.  Plan  to wean off of opioids This plan should start within one week post op of your joint replacement. Maintain the same interval or time between taking each dose and first decrease the dose.  Cut the total daily intake of opioids by one tablet each day Next start to increase the time between doses. The last dose that should be eliminated is the evening dose.           Follow-up Information     Kerrin Champagne, MD .   Specialty: Orthopedic Surgery Contact information: [address] [place] [employer] [place] [address] [phone]                 Discharge Plan:  discharge to home  Disposition:     Signed: Zonia Kief  08/26/2021, 2:19 PM

## 2021-08-29 DIAGNOSIS — F331 Major depressive disorder, recurrent, moderate: Secondary | ICD-10-CM | POA: Diagnosis not present

## 2021-08-29 DIAGNOSIS — F411 Generalized anxiety disorder: Secondary | ICD-10-CM | POA: Diagnosis not present

## 2021-08-30 ENCOUNTER — Other Ambulatory Visit: Payer: Self-pay | Admitting: Specialist

## 2021-08-30 MED ORDER — METHOCARBAMOL 500 MG PO TABS
500.0000 mg | ORAL_TABLET | Freq: Four times a day (QID) | ORAL | 1 refills | Status: DC | PRN
Start: 2021-08-30 — End: 2021-09-19

## 2021-08-30 MED ORDER — HYDROCODONE-ACETAMINOPHEN 7.5-325 MG PO TABS: 1.0000 | ORAL_TABLET | Freq: Four times a day (QID) | ORAL | 0 refills | Status: DC | PRN

## 2021-08-30 NOTE — Telephone Encounter (Signed)
Pt called requesting refill of methocarbamol and hydrocodone. Please send to pharmacy on file. Pt phone number is [phone].

## 2021-09-05 DIAGNOSIS — F411 Generalized anxiety disorder: Secondary | ICD-10-CM | POA: Diagnosis not present

## 2021-09-05 DIAGNOSIS — F4312 Post-traumatic stress disorder, chronic: Secondary | ICD-10-CM | POA: Diagnosis not present

## 2021-09-05 DIAGNOSIS — F902 Attention-deficit hyperactivity disorder, combined type: Secondary | ICD-10-CM | POA: Diagnosis not present

## 2021-09-05 DIAGNOSIS — F332 Major depressive disorder, recurrent severe without psychotic features: Secondary | ICD-10-CM | POA: Diagnosis not present

## 2021-09-05 DIAGNOSIS — F331 Major depressive disorder, recurrent, moderate: Secondary | ICD-10-CM | POA: Diagnosis not present

## 2021-09-12 DIAGNOSIS — F331 Major depressive disorder, recurrent, moderate: Secondary | ICD-10-CM | POA: Diagnosis not present

## 2021-09-12 DIAGNOSIS — F411 Generalized anxiety disorder: Secondary | ICD-10-CM | POA: Diagnosis not present

## 2021-09-16 DIAGNOSIS — M4802 Spinal stenosis, cervical region: Secondary | ICD-10-CM

## 2021-09-16 DIAGNOSIS — M4712 Other spondylosis with myelopathy, cervical region: Secondary | ICD-10-CM

## 2021-09-16 DIAGNOSIS — M5020 Other cervical disc displacement, unspecified cervical region: Secondary | ICD-10-CM

## 2021-09-16 NOTE — Op Note (Signed)
08/02/2021  3:06 PM  PATIENT:  Henry Jennings  51 y.o. male  MRN: [id number]  OPERATIVE REPORT  PRE-OPERATIVE DIAGNOSIS:  cervical stenosis and spondylosis with radiculopathy C4-5, C5-6, C6-7, C7-T1  POST-OPERATIVE DIAGNOSIS:  cervical stenosis and spondylosis with radiculopathy C4-5, C5-6, C6-7, C7-T1  PROCEDURE:  Procedure(s): C4-5, C5-6, C6-7, C7-T1 ANTERIOR CERVICAL DISCECTOMY AND FUSION WITH PLATES AND SCREWS    SURGEON:  Jessy Oto, MD     ASSISTANT:  Benjiman Core, PA-C  (Present throughout the entire procedure and necessary for completion of procedure in a timely manner)     ANESTHESIA:  General, supplemented with local marcaine 0.5% 1:1 exparel 1.3% total 10cc. Dr. Royce Macadamia.     COMPLICATIONS:  None.   EBL: 100CC  DRAINS: Small hemovac drain left anterior neck, Foley to SD    COMPONENTS:   Implant Name Type Inv. Item Serial No. Manufacturer Lot No. LRB No. Used Action  BONE VIVIGEN FORMABLE 1.3CC - [id number] Bone Implant BONE VIVIGEN FORMABLE 1.3CC [id number] [employer]  N/A 1 Implanted  SPACER LORDOTIC ADV-ACF 6MM - [id number] Bone Implant SPACER LORDOTIC ADV-ACF 6MM [id number] [employer]  N/A 1 Implanted  SPACER LORDOTIC ADV-ACF 6MM - [id number] Bone Implant SPACER LORDOTIC ADV-ACF 6MM [id number] [employer]  N/A 1 Implanted  SPACER ACF 7MM - [id number] Bone Implant SPACER ACF 7MM [id number] [employer]NDN  N/A 1 Implanted  PLATE SKYLINE 38OI 4LVL - [id number] Plate PLATE SKYLINE 60RV 4LVL  [employer]  N/A 1 Implanted  SCREW VAR SELF TAP SKYLINE 48M - [id number] Screw SCREW VAR SELF TAP SKYLINE 48M  [employer]  N/A 9 Implanted  SPACER LORDOTIC ADV-ACF 6MM - [id number] Bone Implant SPACER LORDOTIC ADV-ACF 6MM [id number] [employer]ND[employer]  N/A 1 Implanted  SCREW VAR SELF TAP SKYLINE 48M - [id number] Screw SCREW VAR SELF TAP SKYLINE  48M  [employer]  N/A 1 Implanted and Explanted  SCREW SKYLINE VAR OS 48MM - [id number] Screw SCREW SKYLINE VAR OS 48MM  [employer]  N/A 1 Implanted    PROCEDURE:The patient was met in the holding area, and the appropriate Left cervical C4-5,C5-6,C6-7 and C7-T1 level identified and marked with an "X" and my initials. The patient was then transported to OR and was placed on the operative table in a supine position. The patient was then placed under  general anesthesia without difficulty. The patient received appropriate preoperative antibiotic prophylaxis.  . Nursing staff inserted a Foley catheter under sterile conditionsCervical spine was positioned with a [place] horseshoe and 5 pound cervical halter traction. All pressure points well padded and semi-beach chair position. Arm holder both arms. Standard prep with DuraPrep solution the anterior cervical spine chest. Draped in the usual manner. Iodine vi drape was used. Standard timeout protocol was carried out identifying the patient procedure side of the procedure and level. The skin the left neck was infiltrated with Marcaine 0.5% 1:1 Exparel 1.3% total of 10 cc. This along the levels of expected C4 to T1,  incision was longitudinal with some obliquity anterior and medial to the sternocleidomastoid muscle and carried down to the level of the platysma and then medial to sternocleidomastoid muscle. The interval between the trachea and esophagus medially and the carotid sheath laterally was developed as a Metzenbaum scissors and blunt dissection exposing the anterior aspect of cervical spine at the C5-6 level Attention then turned to the C4-5,C5-6 C6-7 and C7-T1 levels where an 18-gauge spinal needles  were placed with sheath intact allowing only a centimeter to extend into the C5-6 disc observed on lateral xray at the C5-6  level. Handheld Cloward retraction of the soft tissues while identifying the level at C4-5, C5-6, C6-7 and  C7-T1removing a portion of the anterior aspect of the discs with15 blade scalpel and pituitary. Medial border of the longus collie muscles was carefully elevated bilaterally and self-retaining retractors were introduced the foot of the blade beneath the medial border of the longus colli muscles. Soft tissue overlying the anterior borders of the disc space at C5-6 and C6-7 level carefully debridement of soft tissue back to bony edges. The anterior lip osteophytes were then resected using rongeur. This bone was preserved for later bone grafting purposes.Distraction pins placed first at the C7-T1 level.The C7-T1 disc space was then first prepared using loupe magnification and headlight with resection of degenerative disc annulus anteriorly and posteriorly and cartilaginous endplates using micro-curettes pituitaries and a high-speed bur.Posterior lip osteophytes were drilled using a high-speed bur and a carefully resected with 1 and 2 mm Kerrison foraminotomy performed over both C8 nerve roots using 1 and 2 mm Kerrisons disc herniation material was noted centrally and into both foramen some of which represented reflected portion of the patient's annulus into the neuroforamen.Posterior aspect of the disc was excised using micro-curettes pituitary rongeur. Following decompression of the spinal cord at both C8 nerve roots irrigation was carried out. The endplates of the inferior aspect of C7 and superior aspect of T1 were carefully prepared using a high-speed bur to parallel. The disc space was then sounded utilized and a precontoured lordotic sounder for the ACDF graft.  Sounding was carried up to a 6 mm implant.  6 mm spacer was felt to provide best fit for the C7-T1 disc space. The 7 mm ACDF graft implant was then brought onto the field. It was then packed with local bone graft that had been harvested previously and vivigen. The implant was then carefully placed over the intervertebral disc space at C6-7 level. Care  taken to ensure that no bone or soft tissue debris within the disc space that could be retropulsed with insertion of  bone graft. The graft was then impacted into place with the head placed in longitudinal cervical traction. The graft was in excellent position alignment. Distraction pins placed first at the T1 level then moved to the C6 level.The C6-7 disc space was then first prepared using loupe magnification and headlight with resection of degenerative disc annulus anteriorly and posteriorly and cartilaginous endplates using micro-curettes pituitaries and a high-speed bur.Posterior lip osteophytes were drilled using a high-speed bur and a carefully resected with 1 and 2 mm Kerrison foraminotomy performed over both C7 nerve roots using 1 and 2 mm Kerrisons disc herniation material was noted centrally and into both foramen some of which represented reflected portion of the patient's annulus into the neuroforamen.Posterior aspect of the disc was excised using micro-curettes pituitary rongeur. Following decompression of the spinal cord at both C7 nerve roots irrigation was carried out. The endplates of the inferior aspect of C6 and superior aspect of C7 were carefully prepared using a high-speed bur to parallel. The disc space was then sounded utilized and a precontoured lordotic sounder for the ACDF graft.  Sounding was carried up to a 7 mm implant.  7 mm spacer was felt to provide best fit for the C6-7 disc space. The 7 mm ACDF graft implant was then brought onto the field. It was then packed  with local bone graft that had been harvested previously. The implant was then carefully placed over the intervertebral disc space at C6-7 level. Care taken to ensure that no bone or soft tissue debris within the disc space that could be retropulsed with insertion of  bone graft. The graft was then impacted into place with the head placed in longitudinal cervical traction. The graft was in excellent position alignment.  Attention then turned to the C5-6. Distraction pin at the C7 removed and placed at the C6 level. The Boss McCollough retractor replaced at the C5-6 level. The operating room microscope was draped sterilely and brought into the field the C-arm previously had been draped sterilely prior to its use. The foot of the blades medial to the longus colli muscles.  Distraction of the disc space performed.The C5-6 disc space was then first prepared [place] with resection of degenerative disc annulus anteriorly and posteriorly and cartilaginous endplates using micro-curettes pituitaries and a high-speed bur. The operating room microscope  was  used. Under the operating room microscope and posterior aspect of the disc was excised using micro-curettes pituitary rongeur and times per. Posterior lip osteophytes were drilled using a high-speed bur and a carefully resected with 1 and 2 mm Kerrison foraminotomy performed over both C6 nerve roots using 1 and 2 mm Kerrisons disc herniation material was noted centrally and into the right foramen some of which represented reflected portion of the patient's annulus into the neuroforamen. Additionally there was significant spur into the right side C6 neuroforamen affecting the right C6 nerve. Following decompression of the spinal cord at both C6 nerve roots, irrigation was carried out. Bleeding controlled using some bone wax excess bone wax removed and Gelfoam thrombin-soaked. The endplates of the inferior aspect of C5 and superior aspect of C6 were carefully prepared using a high-speed bur to parallel. The disc space was then sounded utilized and a precontoured lordotic sounder for the ACDF graft.  Sounding was carried up to a 6 mm implant.  6 mm spacer was felt to provide best fit for the C5-6 disc space. The 6 mm ACDF graft implant was then brought onto the field. It was then packed with local bone graft that had been harvested previously and vivigen. The implant was then carefully placed  over the intervertebral disc space at C5-6 level. Care taken to ensure that no bone or soft tissue debris within the disc space that could be retropulsed with insertion of the bone graft. The graft was then impacted into place with the head placed in longitudinal cervical traction.  The graft was felt to be in excellent position alignment. Distraction pins at the C6 removed and replaced at the C4 level. Attention then turned to the C4-5. The Boss McCollough retractor replaced at the C4-5 level. The foot of the blades medial to the longus colli muscles. With the distraction screw post placed at the C4 level and distraction of the disc space performed.The C4-5 disc space was then first prepared [place] with resection of degenerative disc annulus anteriorly and posteriorly and cartilaginous endplates using micro-curettes pituitaries and a high-speed bur. The operating room microscope  was  used. Under the operating room microscope and posterior aspect of the disc was excised using micro-curettes pituitary rongeur and times per. Posterior lip osteophytes were drilled using a high-speed bur and a carefully resected with 1 and 2 mm Kerrison foraminotomy performed over both C5 nerve roots using 1 and 2 mm Kerrisons disc herniation material was noted centrally and into  the right foramen some of which represented reflected portion of the patient's annulus into the neuroforamen. Additionally there was significant spur into the right side C5 neuroforamen affecting the right C5 nerve. Following decompression of the spinal cord at both C5 nerve roots, irrigation was carried out. Bleeding controlled using some bone wax excess bone wax removed and Gelfoam thrombin-soaked. The endplates of the inferior aspect of C4 and superior aspect of C5 were carefully prepared using a high-speed bur to parallel. The disc space was then sounded utilized and a precontoured lordotic sounder for the ACDF graft.  Sounding was carried up to a 6 mm  implant.  6 mm spacer was felt to provide best fit for the C4-5 disc space. The 6 mm ACDF graft implant was then brought onto the field. It was then packed with local bone graft that had been harvested previously. The implant was then carefully placed over the intervertebral disc space at C4-5 level. Care taken to ensure that no bone or soft tissue debris within the disc space that could be retropulsed with insertion of the bone graft. The graft was then impacted into place with the head placed in longitudinal cervical traction.  The graft was felt to be in excellent position alignment. Distraction pins at the C4and C5 removed. This point irrigation was carried out cervical incision site and lower cervical incision site. Esophagus examined and the upper cervical level as well as at the lower cervical level and found to be normal. Irrigation was again carried out there was no active bleeding present.Longitudinal halter traction was discontinued. The length for cervical plate determined with cottonoid string and two bayonet forceps. Anterior lip osteophytes were then resected about the C4-5, C5-6 and C6-7 levels using a high-speed bur. This area smoothed for application of the plate to the anterior surface of the cervical spine from C4-T1   A 60 mm four level Skyline Depuy plate chosen. The plate carefully applied to the anterior cervical spine and aligned. A single retaining screw placed at the left C7 level.Cervical traction was removed. Drilling 14 mm hole right side at the T1 level and a 14 mm screw placed. The retaining pin was then removed and a drill hole made on the right side at C7 to 14 mm and a 14 mm screw placed. Another 14 m screw then placed on the right side at C7. Another 71m screw then placed on the right side at the C5 level and on the left at the C5, C6, C7 and T1 levels. Then both sides at the C4 level were drill to 14 mm and 14 mm screws placed. Total of 10 x14 mm screws were placed each  locked within the plate quite nicely. Intraoperative lateral radiograph of the cervical spine demonstrate plates and screws and bone grafts to be in good position alignment without any sign of canal impingement or retropulsion of graft. Irrigation was carried out at cervical incision site and lower cervical incision site. Esophagus examined and the upper cervical level as well as at the lower cervical level and found to be normal. A small hemovac drain then placed exiting on the left neck just anterior to the left sternocleidomastoid. This was sewn in place with a 4-0 Nylon suture.  The incisions were then closed by approximating the omohyoid muscle with 2-0 vicryl sutures then the deep subcutaneous layers the platysma layer sutured with interrupted 2-0 Vicryl suture and the superficial fascia overlying the sternocleidomastoid muscle with interrupted 3-0 Vicryl sutures. The subcutaneous layers  were approximated with interrupted 3-0 Vicryl sutures as were the superficial layers. Skin was approximated with Dermabond. Mepilex bandage was applied. A [place] collar was then applied to the cervical spine. drapes were removed. Patient's or table was then returned to the flat position. At the end of the cervical spine surgery case all instrument and sponge counts were correct. Patient was then reactivated extubated and returned to recovery room in satisfactory condition.  Benjiman Core PA-C perform the duties of assistant surgeon performing careful retraction of the esophagus and trachea during the initial exposure and careful suctioning of neural elements cervical nerve roots and cervical cord. He was present from the beginning of case to the end of the case and assisted in positioning of the patient in transfer the patient from bed to the stretcher at the end of the case. He closed the incision on the platysma layer to the skin. Applied dressing.      Basil Dess 09/16/2021, 3:06 PM

## 2021-09-19 ENCOUNTER — Encounter: Payer: Self-pay | Admitting: Specialist

## 2021-09-19 ENCOUNTER — Ambulatory Visit: Payer: Self-pay

## 2021-09-19 ENCOUNTER — Ambulatory Visit (INDEPENDENT_AMBULATORY_CARE_PROVIDER_SITE_OTHER): Payer: BC Managed Care – PPO | Admitting: Specialist

## 2021-09-19 ENCOUNTER — Telehealth: Payer: Self-pay | Admitting: Specialist

## 2021-09-19 VITALS — BP 126/88 | HR 108 | Ht 66.0 in | Wt 131.0 lb

## 2021-09-19 DIAGNOSIS — Z981 Arthrodesis status: Secondary | ICD-10-CM

## 2021-09-19 DIAGNOSIS — R200 Anesthesia of skin: Secondary | ICD-10-CM

## 2021-09-19 DIAGNOSIS — R202 Paresthesia of skin: Secondary | ICD-10-CM

## 2021-09-19 DIAGNOSIS — R292 Abnormal reflex: Secondary | ICD-10-CM

## 2021-09-19 DIAGNOSIS — R258 Other abnormal involuntary movements: Secondary | ICD-10-CM

## 2021-09-19 MED ORDER — METHOCARBAMOL 500 MG PO TABS
500.0000 mg | ORAL_TABLET | Freq: Four times a day (QID) | ORAL | 1 refills | Status: DC | PRN
Start: 2021-09-19 — End: 2021-09-19

## 2021-09-19 MED ORDER — BACLOFEN 10 MG PO TABS: 10.0000 mg | ORAL_TABLET | Freq: Three times a day (TID) | ORAL | 0 refills | Status: DC

## 2021-09-19 MED ORDER — HYDROCODONE-ACETAMINOPHEN 7.5-325 MG PO TABS: 1.0000 | ORAL_TABLET | Freq: Four times a day (QID) | ORAL | 0 refills | Status: DC | PRN

## 2021-09-19 MED ORDER — METHOCARBAMOL 500 MG PO TABS
500.0000 mg | ORAL_TABLET | Freq: Four times a day (QID) | ORAL | 1 refills | Status: DC | PRN
Start: 2021-09-19 — End: 2021-11-04

## 2021-09-19 NOTE — Addendum Note (Signed)
Addended by: Basil Dess on: 09/19/2021 05:44 PM   Modules accepted: Orders

## 2021-09-19 NOTE — Addendum Note (Signed)
Addended by: Minda Ditto, Alyse Low N on: 09/19/2021 04:10 PM   Modules accepted: Orders

## 2021-09-19 NOTE — Progress Notes (Signed)
Post-Op Visit Note   Patient: Henry Jennings           Date of Birth: January 29, 1970           MRN: [id number] Visit Date: 09/19/2021 PCP: Joycelyn Rua, MD   Assessment & Plan: 6 weeks post op 4 level ACDF C4-T1. Chief Complaint:  Chief Complaint  Patient presents with   Neck - Routine Post Op  Incision is healed no swellling. Motor is normal Hyperreflexia all extremities. Pathologic spread right to left leg.  Visit Diagnoses:  1. S/P cervical spinal fusion     Plan: Avoid overhead lifting and overhead use of the arms. Do not lift greater than 5 lbs. Adjust head rest in vehicle to prevent hyperextension if rear ended. Take extra precautions to avoid falling. Okay to use soft collar.  Ambulation up to 1-2 miles per day . Referral for Rehab evaluation and treatment.   Follow-Up Instructions: No follow-ups on file.   Orders:  Orders Placed This Encounter  Procedures   XR Cervical Spine 2 or 3 views   No orders of the defined types were placed in this encounter.   Imaging: No results found.  PMFS History: Patient Active Problem List   Diagnosis Date Noted   Spinal stenosis of cervical region    Cervical disc herniation    Other spondylosis with myelopathy, cervical region    S/P cervical spinal fusion 08/02/2021   Anxiety 06/06/2021   Bipolar disorder (HCC) 06/06/2021   Chronic fatigue syndrome 06/06/2021   Fibromyalgia 06/06/2021   Constipation 06/06/2021   Insomnia 06/06/2021   Panic disorder 06/06/2021   Gait abnormality 06/06/2021   Neck pain 06/06/2021   Past Medical History:  Diagnosis Date   Depression    Herniated disc, cervical     No family history on file.  Past Surgical History:  Procedure Laterality Date   ANTERIOR CERVICAL DECOMP/DISCECTOMY FUSION N/A 08/02/2021   Procedure: C4-5, C5-6, C6-7, C7-T1 ANTERIOR CERVICAL DISCECTOMY AND FUSION WITH PLATES AND SCREWS;  Surgeon: Kerrin Champagne, MD;  Location: [facility] OR;  Service: Orthopedics;   Laterality: N/A;   colonscopy  2021   WISDOM TOOTH EXTRACTION Left 2020   Left lower   Social History   Occupational History   Not on file  Tobacco Use   Smoking status: Never   Smokeless tobacco: Never  Vaping Use   Vaping Use: Never used  Substance and Sexual Activity   Alcohol use: Not Currently   Drug use: Never   Sexual activity: Yes

## 2021-09-19 NOTE — Telephone Encounter (Signed)
Patient's medication should be sent to [facility], [place]. AT [place]

## 2021-09-19 NOTE — Patient Instructions (Signed)
Avoid overhead lifting and overhead use of the arms. Do not lift greater than 5 lbs. Adjust head rest in vehicle to prevent hyperextension if rear ended. Take extra precautions to avoid falling. Okay to use soft collar.  Ambulation up to 1-2 miles per day . Referral for Rehab evaluation and treatment.

## 2021-09-20 DIAGNOSIS — F411 Generalized anxiety disorder: Secondary | ICD-10-CM | POA: Diagnosis not present

## 2021-09-20 DIAGNOSIS — F331 Major depressive disorder, recurrent, moderate: Secondary | ICD-10-CM | POA: Diagnosis not present

## 2021-09-20 NOTE — Telephone Encounter (Signed)
These were sent to the correct pharm

## 2021-09-24 NOTE — Progress Notes (Signed)
Office Visit Note   Patient: KHALIQ BURGGRAF           Date of Birth: [birth year]           MRN: [id number] Visit Date: 08/16/2021              Requested by: Joycelyn Rua, MD [address] [place],  [place] [address] PCP: Joycelyn Rua, MD   Assessment & Plan: Visit Diagnoses:  1. Spinal stenosis of cervical region   2. S/P cervical spinal fusion     Plan: Patient doing well.  We will continue his collar.  No driving.  No lifting pushing pulling.  Follow-up with Dr. Otelia Sergeant in 4 weeks for recheck.  Follow-Up Instructions: Return in about 4 weeks (around 09/13/2021) for WITH DR NITKA FOR POSTOP CHECK.   Orders:  Orders Placed This Encounter  Procedures   XR Cervical Spine 2 or 3 views   No orders of the defined types were placed in this encounter.     Procedures: No procedures performed   Clinical Data: No additional findings.   Subjective: Chief Complaint  Patient presents with   Neck - Routine Post Op    HPI 51 year old male returns.  2 weeks status post multilevel cervical fusion.  He is doing well.  Preop arm symptoms improved.  He has been compliant with wearing collar.     Objective: Vital Signs: There were no vitals taken for this visit.  Physical Exam Pleasant male alert and oriented in no acute distress.  Surgical incision looks good.  No signs of infection.  Neurologically intact. Ortho Exam  Specialty Comments:  No specialty comments available.  Imaging: No results found.   PMFS History: Patient Active Problem List   Diagnosis Date Noted   Spinal stenosis of cervical region    Cervical disc herniation    Other spondylosis with myelopathy, cervical region    S/P cervical spinal fusion 08/02/2021   Anxiety 06/06/2021   Bipolar disorder (HCC) 06/06/2021   Chronic fatigue syndrome 06/06/2021   Fibromyalgia 06/06/2021   Constipation 06/06/2021   Insomnia 06/06/2021   Panic disorder 06/06/2021   Gait abnormality 06/06/2021    Neck pain 06/06/2021   Past Medical History:  Diagnosis Date   Depression    Herniated disc, cervical     No family history on file.  Past Surgical History:  Procedure Laterality Date   ANTERIOR CERVICAL DECOMP/DISCECTOMY FUSION N/A 08/02/2021   Procedure: C4-5, C5-6, C6-7, C7-T1 ANTERIOR CERVICAL DISCECTOMY AND FUSION WITH PLATES AND SCREWS;  Surgeon: Kerrin Champagne, MD;  Location: [facility] OR;  Service: Orthopedics;  Laterality: N/A;   colonscopy  2021   WISDOM TOOTH EXTRACTION Left 2020   Left lower   Social History   Occupational History   Not on file  Tobacco Use   Smoking status: Never   Smokeless tobacco: Never  Vaping Use   Vaping Use: Never used  Substance and Sexual Activity   Alcohol use: Not Currently   Drug use: Never   Sexual activity: Yes

## 2021-09-28 DIAGNOSIS — F411 Generalized anxiety disorder: Secondary | ICD-10-CM | POA: Diagnosis not present

## 2021-09-28 DIAGNOSIS — F331 Major depressive disorder, recurrent, moderate: Secondary | ICD-10-CM | POA: Diagnosis not present

## 2021-09-30 ENCOUNTER — Encounter: Payer: Self-pay | Admitting: Physical Medicine & Rehabilitation

## 2021-10-03 DIAGNOSIS — F332 Major depressive disorder, recurrent severe without psychotic features: Secondary | ICD-10-CM | POA: Diagnosis not present

## 2021-10-03 DIAGNOSIS — F4312 Post-traumatic stress disorder, chronic: Secondary | ICD-10-CM | POA: Diagnosis not present

## 2021-10-03 DIAGNOSIS — F331 Major depressive disorder, recurrent, moderate: Secondary | ICD-10-CM | POA: Diagnosis not present

## 2021-10-03 DIAGNOSIS — F411 Generalized anxiety disorder: Secondary | ICD-10-CM | POA: Diagnosis not present

## 2021-10-03 DIAGNOSIS — F902 Attention-deficit hyperactivity disorder, combined type: Secondary | ICD-10-CM | POA: Diagnosis not present

## 2021-10-05 ENCOUNTER — Telehealth: Payer: Self-pay | Admitting: Specialist

## 2021-10-05 NOTE — Telephone Encounter (Signed)
Pt called. He states his appt with [facility] is nov 3,2023. With that being said Dr.Nitka will no longer be with our practice. Pt is wanting to be advised on next steps. Also pt states Dr.Nitka had him take another muscle relaxer and it did make him pretty out of it so he was wondering which one Nitka wanted to continue. He said he was fine with either but can not take both together. Also he would like a refill on hydrocodone.    Cb [phone]

## 2021-10-06 NOTE — Telephone Encounter (Signed)
Printed and gave to Nitka 

## 2021-10-06 NOTE — Telephone Encounter (Signed)
Can we please try to get a update with Dr.Nitka. Pt needs to really know

## 2021-10-07 ENCOUNTER — Telehealth: Payer: Self-pay | Admitting: Specialist

## 2021-10-07 ENCOUNTER — Other Ambulatory Visit: Payer: Self-pay | Admitting: Surgery

## 2021-10-07 MED ORDER — HYDROCODONE-ACETAMINOPHEN 5-325 MG PO TABS: 1.0000 | ORAL_TABLET | Freq: Three times a day (TID) | ORAL | 0 refills | Status: DC | PRN

## 2021-10-07 NOTE — Telephone Encounter (Signed)
Maren Reamer sent in Hydrocodone for patient. I tried to call patient to advise, voicemail is full.  Looks like you have sent Dr. Louanne Skye previous message to advise on referral.

## 2021-10-07 NOTE — Telephone Encounter (Signed)
Pt called again about this same issue with his referral for physical med and rehab. Also this pt needs a refill on his hydrocodone.   Cb [phone]

## 2021-10-07 NOTE — Telephone Encounter (Signed)
Can you please advise on refill for hydrocodone?

## 2021-10-10 DIAGNOSIS — F331 Major depressive disorder, recurrent, moderate: Secondary | ICD-10-CM | POA: Diagnosis not present

## 2021-10-10 DIAGNOSIS — F411 Generalized anxiety disorder: Secondary | ICD-10-CM | POA: Diagnosis not present

## 2021-10-12 ENCOUNTER — Ambulatory Visit: Payer: BC Managed Care – PPO | Admitting: Specialist

## 2021-10-13 NOTE — Telephone Encounter (Signed)
Came in with mom to be seen today

## 2021-10-17 DIAGNOSIS — F411 Generalized anxiety disorder: Secondary | ICD-10-CM | POA: Diagnosis not present

## 2021-10-17 DIAGNOSIS — F331 Major depressive disorder, recurrent, moderate: Secondary | ICD-10-CM | POA: Diagnosis not present

## 2021-10-24 DIAGNOSIS — F331 Major depressive disorder, recurrent, moderate: Secondary | ICD-10-CM | POA: Diagnosis not present

## 2021-10-24 DIAGNOSIS — F411 Generalized anxiety disorder: Secondary | ICD-10-CM | POA: Diagnosis not present

## 2021-10-26 ENCOUNTER — Ambulatory Visit (INDEPENDENT_AMBULATORY_CARE_PROVIDER_SITE_OTHER): Payer: BC Managed Care – PPO

## 2021-10-26 ENCOUNTER — Encounter: Payer: Self-pay | Admitting: Specialist

## 2021-10-26 ENCOUNTER — Ambulatory Visit (INDEPENDENT_AMBULATORY_CARE_PROVIDER_SITE_OTHER): Payer: BC Managed Care – PPO | Admitting: Specialist

## 2021-10-26 VITALS — BP 121/87 | HR 109 | Ht 66.0 in | Wt 131.0 lb

## 2021-10-26 DIAGNOSIS — Z981 Arthrodesis status: Secondary | ICD-10-CM | POA: Diagnosis not present

## 2021-10-26 NOTE — Patient Instructions (Addendum)
Plan: Discontinue collar except with car for next 1-2 weeks PT for cervical ROM upper extremity motor and coordination, aerobic exercises.  Do not lift greater than 10-15 lbs. Adjust head rest in vehicle to prevent hyperextension if rear ended. Take extra precautions to avoid falling. 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  Mr. Henry Jennings has impairment of his spine that amounts to over 50% loss of function and on this basis he may need to consider Application for disability should he find that he is not able to return to gainful employment.  This patient has a 4 level cervical fusion with signs of lower extremity hyperreflexia consistent with myelopathy. He is unable to return to work and I have recommended that he enter into a physical therapy program directed at improving his ability to use his hands to manipulate his enviroment and to Increase his capacity to stand and walk and sit comfortably. Due to the number of segments fused he is not able to perform jobs that involve use of heavy equipment, overhead gaze or use of arms overhead or to drive for an occupation due to limitation of ROM of the cervical spine that is permanent. In my opinion he is likely to find difficulty Returning to work gainfully but he is early from the time of his surgical treatment and physical therapy and attempts to maximize his functional status is Recommended at this point. I am concerned that with a four level anterior cervical fusion that he may not be able to return to gainful full time employment But it remains to be seen what his capacity is following further rehabilitation.

## 2021-10-26 NOTE — Progress Notes (Addendum)
Post-Op Visit Note   Patient: Henry Jennings           Date of Birth: 09/11/1970           MRN: [id number] Visit Date: 10/26/2021 PCP: Joycelyn Rua, MD   Assessment & Plan:12 weeks post op 4 level ACDF C4-5 through C7-T1  Chief Complaint:  Chief Complaint  Patient presents with   Neck - Routine Post Op   Visit Diagnoses:  1. S/P cervical spinal fusion   12 weeks post op 4 level ACDF for congenital cervical stenosis with hyperreflexia. He has numbness into the dorsal forearms But is able to take less pain meds and muscle relaxers. Soft C-collar now for last 4-6 weeks. Incision is healed. Swallow if fine, some  Intermittant sore throat.  Motor is intact. No hoarseness. Hyperreflexia bilateral LE at knee and ankle. Radiographs with less than 1mm of movement with flexion and extension suggests healing is occurring. Avoid overhead lifting and overhead use of the arms.  Plan: Discontinue collar except with car for next 1-2 weeks PT for cervical ROM upper extremity motor and coordination, aerobic exercises.  Do not lift greater than 5 lbs. Adjust head rest in vehicle to prevent hyperextension if rear ended. Take extra precautions to avoid falling. 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  This patient has a 4 level cervical fusion with signs of lower extremity hyperreflexia consistent with myelopathy. He is unable to return to work and I have recommended that he enter into a physical therapy program directed at improving his ability to use his hands to manipulate his enviroment and to Increase his capacity to stand and walk and sit comfortably. Due to the number of segments fused he is not able to perform jobs that involve use of heavy equipment, overhead gaze or use of arms or to drive for an occupation due to limitation of ROM of the  cervical spine that is permanent. In my opinion he is likely to find difficulty Returning to work gainfully but he is early from the time of his surgical treatment and physical therapy and attempts to maximize his functional status is Recommended at this point. I am concerned that with a four level anterior cervical fusion that he may not be able to return to gainful full time employment But it remains to be seen what his capacity is following further rehabilitation.  Follow-Up Instructions: No follow-ups on file.   Orders:  Orders Placed This Encounter  Procedures   XR Cervical Spine 2 or 3 views   No orders of the defined types were placed in this encounter.   Imaging: No results found.  PMFS History: Patient Active Problem List   Diagnosis Date Noted   Spinal stenosis of cervical region    Cervical disc herniation    Other spondylosis with myelopathy, cervical region    S/P cervical spinal fusion 08/02/2021   Anxiety 06/06/2021   Bipolar disorder (HCC) 06/06/2021   Chronic fatigue syndrome 06/06/2021   Fibromyalgia 06/06/2021   Constipation 06/06/2021   Insomnia 06/06/2021   Panic disorder 06/06/2021   Gait abnormality 06/06/2021   Neck pain 06/06/2021   Past Medical History:  Diagnosis Date   Depression    Herniated disc, cervical     No family history on file.  Past Surgical History:  Procedure  Laterality Date   ANTERIOR CERVICAL DECOMP/DISCECTOMY FUSION N/A 08/02/2021   Procedure: C4-5, C5-6, C6-7, C7-T1 ANTERIOR CERVICAL DISCECTOMY AND FUSION WITH PLATES AND SCREWS;  Surgeon: Kerrin Champagne, MD;  Location: [facility] OR;  Service: Orthopedics;  Laterality: N/A;   colonscopy  2021   WISDOM TOOTH EXTRACTION Left 2020   Left lower   Social History   Occupational History   Not on file  Tobacco Use   Smoking status: Never   Smokeless tobacco: Never  Vaping Use   Vaping Use: Never used  Substance and Sexual Activity   Alcohol use: Not Currently   Drug use: Never    Sexual activity: Yes

## 2021-10-31 DIAGNOSIS — F331 Major depressive disorder, recurrent, moderate: Secondary | ICD-10-CM | POA: Diagnosis not present

## 2021-10-31 DIAGNOSIS — F411 Generalized anxiety disorder: Secondary | ICD-10-CM | POA: Diagnosis not present

## 2021-11-01 ENCOUNTER — Other Ambulatory Visit: Payer: Self-pay | Admitting: Specialist

## 2021-11-02 NOTE — Telephone Encounter (Signed)
Sending in case you were waiting on this response.

## 2021-11-02 NOTE — Telephone Encounter (Signed)
Mailed letter to patient

## 2021-11-04 ENCOUNTER — Encounter: Payer: Self-pay | Admitting: Physical Medicine & Rehabilitation

## 2021-11-04 ENCOUNTER — Encounter
Payer: BC Managed Care – PPO | Attending: Physical Medicine & Rehabilitation | Admitting: Physical Medicine & Rehabilitation

## 2021-11-04 VITALS — BP 119/88 | HR 107 | Ht 66.0 in | Wt 139.0 lb

## 2021-11-04 DIAGNOSIS — M797 Fibromyalgia: Secondary | ICD-10-CM | POA: Insufficient documentation

## 2021-11-04 DIAGNOSIS — Z981 Arthrodesis status: Secondary | ICD-10-CM | POA: Diagnosis not present

## 2021-11-04 MED ORDER — GABAPENTIN 300 MG PO CAPS: 300.0000 mg | ORAL_CAPSULE | Freq: Two times a day (BID) | ORAL | 1 refills | Status: DC

## 2021-11-04 NOTE — Progress Notes (Signed)
Subjective:    Patient ID: Henry Jennings, male    DOB: 08-28-1970, 51 y.o.   MRN: [id number]  HPI 51 year old male kindly referred by Dr. Vira Browns for the evaluation of multiple pain complaints.  Chief complaint is bilateral upper extremity numbness tingling and pain. Patient has had issues for 16 years.  He was followed by Dr. Newell Coral from neurosurgery prior to his retirement.  He has been through physical therapy for primary symptoms of neck pain and right upper extremity radiculopathy.  MRI of the cervical spine in 2007 demonstrated disc herniation at C5-6 impinging upon right C6 nerve root and mass effect on the spinal cord.  Most recent MRI of the cervical spine performed on 06/12/2021 demonstrated moderate to severe bilateral foraminal stenosis at C4-5 some flattening of the ventral thecal sac at that level.  Disc osteophyte complex at C5-6 with severe right foraminal stenosis moderate to severe left foraminal stenosis at C6-C7 moderate to severe bilateral foraminal stenosis, disc osteophyte complex C6 C7-T1 with bilateral foraminal stenosis at that level.  Cord changes seen C4-C7, myelomalacia C4-T1 fusion 08/02/2021 Hx Fibromyalgia seen by rheumatology , Neurology Hypersensitivity to touch in [facility] , started on gabapentin  which initially helped, only got up to 200 mg twice daily Has been using CBD which is helping No falling  Walks ~1/2 miles several times a week.   Pt was dropping objects pre op but not post op  Had very jumpy reflexes in LE   Walking ok, no falls     On Cymbalta per PCP and PSychiatry  Strattera  Very healthy until 5-6 years ago Pain Inventory Average Pain 7 Pain Right Now 7 My pain is constant, sharp, burning, dull, and tingling  In the last 24 hours, has pain interfered with the following? General activity 7 Relation with others 8 Enjoyment of life 8 What TIME of day is your pain at its worst? morning  and night Sleep (in general) Fair  Pain is  worse with: bending, inactivity, standing, and some activites Pain improves with: rest, heat/ice, pacing activities, and medication Relief from Meds: 7  walk without assistance how many minutes can you walk? One half a mile ability to climb steps?  no do you drive?  no still in recovery from surgery  disabled: date disabled in progress I need assistance with the following:  meal prep, household duties, and shopping Do you have any goals in this area?  yes  weakness numbness tingling spasms dizziness depression anxiety  Any changes since last visit?  yes, Xray in [facility]  Any changes since last visit?  no    History reviewed. No pertinent family history. Social History   Socioeconomic History   Marital status: Married    Spouse name: [name]   Number of children: Not on file   Years of education: Not on file   Highest education level: Not on file  Occupational History   Not on file  Tobacco Use   Smoking status: Never   Smokeless tobacco: Never  Vaping Use   Vaping Use: Never used  Substance and Sexual Activity   Alcohol use: Not Currently   Drug use: Never   Sexual activity: Yes  Other Topics Concern   Not on file  Social History Narrative   Not on file   Social Determinants of Health   Financial Resource Strain: Not on file  Food Insecurity: Not on file  Transportation Needs: Not on file  Physical Activity: Not  on file  Stress: Not on file  Social Connections: Not on file   Past Surgical History:  Procedure Laterality Date   ANTERIOR CERVICAL DECOMP/DISCECTOMY FUSION N/A 08/02/2021   Procedure: C4-5, C5-6, C6-7, C7-T1 ANTERIOR CERVICAL DISCECTOMY AND FUSION WITH PLATES AND SCREWS;  Surgeon: Kerrin Champagne, MD;  Location: [facility] OR;  Service: Orthopedics;  Laterality: N/A;   colonscopy  2021   WISDOM TOOTH EXTRACTION Left 2020   Left lower   Past Medical History:  Diagnosis Date   Depression    Herniated disc, cervical    Ht 5\' 6"  (1.676 m)    Wt 139 lb (63 kg)   BMI 22.44 kg/m   Opioid Risk Score:   Fall Risk Score:  `1  Depression screen [facility] 2/9     11/04/2021   11:53 AM  Depression screen PHQ 2/9  Decreased Interest 1  Down, Depressed, Hopeless 2  PHQ - 2 Score 3  Altered sleeping 1  Tired, decreased energy 3  Change in appetite 2  Feeling bad or failure about yourself  3  Trouble concentrating 1  Moving slowly or fidgety/restless 1  Suicidal thoughts 0  PHQ-9 Score 14    Review of Systems  Genitourinary:  Positive for frequency.  Musculoskeletal:  Positive for back pain and neck pain. Negative for gait problem.       Pain from both shoulders across back, pain in left upper leg, left groin, butttocks, and both lower arms  Skin:  Positive for color change.  All other systems reviewed and are negative.      Objective:   Physical Exam Vitals and nursing note reviewed.  Constitutional:      Appearance: He is normal weight.  HENT:     Head: Normocephalic and atraumatic.  Eyes:     Extraocular Movements: Extraocular movements intact.     Conjunctiva/sclera: Conjunctivae normal.     Pupils: Pupils are equal, round, and reactive to light.  Skin:    General: Skin is warm and dry.  Neurological:     Mental Status: He is alert and oriented to person, place, and time.     Deep Tendon Reflexes:     Reflex Scores:      Tricep reflexes are 0 on the right side and 0 on the left side.      Bicep reflexes are 0 on the right side and 2+ on the left side.      Brachioradialis reflexes are 0 on the right side and 0 on the left side.      Patellar reflexes are 4+ on the right side and 4+ on the left side.      Achilles reflexes are 3+ on the right side and 3+ on the left side.    Comments: Motor strength is 5/5 bilateral deltoid bicep tricep grip hip flexor knee extensor ankle dorsiflexor and plantar flexor Sensation intact to light touch bilateral C6-C7-C8 dermatomal distribution.  Psychiatric:        Mood and  Affect: Mood normal.        Behavior: Behavior normal.           Assessment & Plan:   1.  Cervical myelopathy in a patient with history of fibromyalgia.  There are some cord signal changes.  He has had adequate decompression at multiple levels.  Some of his upper extremity complaints have improved but he does have some residual issues.  Differential includes paresthesias/dysesthesias from cervical myelopathy which are not likely to resolve  completely due to longstanding nature.  Decompression was performed 3 months ago he still will have further improvement over the next year or so.  His upper extremity reflexes are down likely due to multilevel foraminal stenosis also longstanding. In addition we discussed that fibromyalgia syndrome can often cause paresthesias. We will check EMG/NCV bilateral upper extremities to evaluate for signs of chronic cervical radiculopathy.  We discussed postoperative physical therapy given his symptoms of feeling weak although manual muscle testing shows normal strength.  May have some easy fatigability. In terms of medication management has not had adequate trial gabapentin we will trial 300 mg twice daily may need to titrate upward.  We discussed alternative of pregabalin. Continue with duloxetine 60 mg twice daily prescribed by PCP/psychiatry.

## 2021-11-07 DIAGNOSIS — F411 Generalized anxiety disorder: Secondary | ICD-10-CM | POA: Diagnosis not present

## 2021-11-07 DIAGNOSIS — F4312 Post-traumatic stress disorder, chronic: Secondary | ICD-10-CM | POA: Diagnosis not present

## 2021-11-07 DIAGNOSIS — F331 Major depressive disorder, recurrent, moderate: Secondary | ICD-10-CM | POA: Diagnosis not present

## 2021-11-07 DIAGNOSIS — F332 Major depressive disorder, recurrent severe without psychotic features: Secondary | ICD-10-CM | POA: Diagnosis not present

## 2021-11-07 DIAGNOSIS — F902 Attention-deficit hyperactivity disorder, combined type: Secondary | ICD-10-CM | POA: Diagnosis not present

## 2021-11-14 DIAGNOSIS — F331 Major depressive disorder, recurrent, moderate: Secondary | ICD-10-CM | POA: Diagnosis not present

## 2021-11-14 DIAGNOSIS — F411 Generalized anxiety disorder: Secondary | ICD-10-CM | POA: Diagnosis not present

## 2021-11-21 DIAGNOSIS — F331 Major depressive disorder, recurrent, moderate: Secondary | ICD-10-CM | POA: Diagnosis not present

## 2021-11-21 DIAGNOSIS — F411 Generalized anxiety disorder: Secondary | ICD-10-CM | POA: Diagnosis not present

## 2021-11-28 DIAGNOSIS — F411 Generalized anxiety disorder: Secondary | ICD-10-CM | POA: Diagnosis not present

## 2021-11-28 DIAGNOSIS — F331 Major depressive disorder, recurrent, moderate: Secondary | ICD-10-CM | POA: Diagnosis not present

## 2021-12-05 DIAGNOSIS — F4312 Post-traumatic stress disorder, chronic: Secondary | ICD-10-CM | POA: Diagnosis not present

## 2021-12-05 DIAGNOSIS — F332 Major depressive disorder, recurrent severe without psychotic features: Secondary | ICD-10-CM | POA: Diagnosis not present

## 2021-12-05 DIAGNOSIS — F902 Attention-deficit hyperactivity disorder, combined type: Secondary | ICD-10-CM | POA: Diagnosis not present

## 2021-12-14 DIAGNOSIS — F411 Generalized anxiety disorder: Secondary | ICD-10-CM | POA: Diagnosis not present

## 2021-12-14 DIAGNOSIS — F331 Major depressive disorder, recurrent, moderate: Secondary | ICD-10-CM | POA: Diagnosis not present

## 2021-12-19 DIAGNOSIS — F411 Generalized anxiety disorder: Secondary | ICD-10-CM | POA: Diagnosis not present

## 2021-12-19 DIAGNOSIS — F331 Major depressive disorder, recurrent, moderate: Secondary | ICD-10-CM | POA: Diagnosis not present

## 2021-12-23 ENCOUNTER — Ambulatory Visit (INDEPENDENT_AMBULATORY_CARE_PROVIDER_SITE_OTHER): Payer: BC Managed Care – PPO

## 2021-12-23 ENCOUNTER — Encounter: Payer: Self-pay | Admitting: Orthopedic Surgery

## 2021-12-23 ENCOUNTER — Ambulatory Visit (INDEPENDENT_AMBULATORY_CARE_PROVIDER_SITE_OTHER): Payer: BC Managed Care – PPO | Admitting: Orthopedic Surgery

## 2021-12-23 VITALS — BP 133/92 | HR 116 | Ht 66.0 in | Wt 139.0 lb

## 2021-12-23 DIAGNOSIS — Z981 Arthrodesis status: Secondary | ICD-10-CM | POA: Diagnosis not present

## 2021-12-23 MED ORDER — METHOCARBAMOL 750 MG PO TABS: 750.0000 mg | ORAL_TABLET | Freq: Four times a day (QID) | ORAL | 1 refills | Status: AC

## 2021-12-23 NOTE — Progress Notes (Signed)
Orthopedic Surgery Post-operative Progress Note  Assessment: Patient is a 51 y.o. male who is currently admitted after undergoing C4-T1 ACDF with my retired partner, Dr. Otelia Sergeant, on 08/02/2021. Having some periscapular and trapezius soreness but it is tolerable and he is overall pleased with the outcome so far   Plan: -Operative plans complete -Out of bed as tolerated, no brace -Prescribe Robaxin today as that has been helping him -Can return to activity as tolerated.  No spine specific precautions at this time.  Should gradually return to full activity and let pain be his guide -Already is set up with OT, should continue to work with them for his trapezius and interscapular pain -Follow-up in 2 months; x-rays at next visit: cervical AP/lateral/flex/ex  _________________________________________________________________________  Subjective: Feels he has gotten significant pain relief with the surgery.  Is pleased with his outcome thus far.  Having some soreness in the trapezius and interscapular region.  This pain is intermittent nature.  Still has some decrease sensation in his forms going in his hands, but it is improving.  Notices that his hands are weaker particularly with repetitive activity.  Incision has had no redness or drainage.  Is ambulating without assistive devices.  Objective:  General: no acute distress, appropriate affect Neurologic: alert, answering questions appropriately, following commands Respiratory: unlabored breathing on room air Skin: Incision appears well-healed with no evidence of infection  MSK (spine):  -Strength exam      Right  Left Grip strength                5/5  5/5 Interosseus   5/5   5/5 Wrist extension  5/5  5/5 Wrist flexion   5/5  5/5 Elbow flexion   5/5  5/5 Deltoid    5/5  5/5  EHL    5/5  5/5 TA    5/5  5/5 GSC    5/5  5/5 Knee extension  5/5  5/5 Hip flexion   5/5  5/5  -Sensory exam    Sensation intact to light touch in L3-S1 nerve  distributions of bilateral lower extremities  Sensation intact to light touch in C5-T1 nerve distributions of bilateral upper extremities (decreased in bilateral C6 and C7 distributions)  X-ray of the cervical spine taken on 12/23/2021 was independently reviewed and interpreted, showing C4-7 ACDF.  There is no lucency around the screws.  No screws are backing out.  Allografts are in place.  Interspinous motion between the flexion and extension views at all fused levels are 1 mm or less.  There was only 3 mm of interspinous motion at the superjacent level.    Patient name: Henry Jennings Patient MRN: [id number] Date: 12/23/21

## 2021-12-28 DIAGNOSIS — F331 Major depressive disorder, recurrent, moderate: Secondary | ICD-10-CM | POA: Diagnosis not present

## 2021-12-28 DIAGNOSIS — F411 Generalized anxiety disorder: Secondary | ICD-10-CM | POA: Diagnosis not present

## 2021-12-29 ENCOUNTER — Encounter
Payer: BC Managed Care – PPO | Attending: Physical Medicine & Rehabilitation | Admitting: Physical Medicine & Rehabilitation

## 2021-12-29 ENCOUNTER — Encounter: Payer: Self-pay | Admitting: Physical Medicine & Rehabilitation

## 2021-12-29 VITALS — BP 124/90 | HR 105 | Ht 66.0 in | Wt 146.0 lb

## 2021-12-29 DIAGNOSIS — R200 Anesthesia of skin: Secondary | ICD-10-CM

## 2022-01-03 NOTE — Progress Notes (Signed)
RUE EMG /NCV performed today  Only abnormality was mildly reduced triceps recruitment which was seen bilaterally  Poor relaxation of cervical paraspinals technically limited the study  No evidence of R CTS or ulnar neuropathy

## 2022-01-04 DIAGNOSIS — F331 Major depressive disorder, recurrent, moderate: Secondary | ICD-10-CM | POA: Diagnosis not present

## 2022-01-04 DIAGNOSIS — F411 Generalized anxiety disorder: Secondary | ICD-10-CM | POA: Diagnosis not present

## 2022-01-09 DIAGNOSIS — F4312 Post-traumatic stress disorder, chronic: Secondary | ICD-10-CM | POA: Diagnosis not present

## 2022-01-09 DIAGNOSIS — F332 Major depressive disorder, recurrent severe without psychotic features: Secondary | ICD-10-CM | POA: Diagnosis not present

## 2022-01-09 DIAGNOSIS — F902 Attention-deficit hyperactivity disorder, combined type: Secondary | ICD-10-CM | POA: Diagnosis not present

## 2022-01-11 DIAGNOSIS — L219 Seborrheic dermatitis, unspecified: Secondary | ICD-10-CM | POA: Diagnosis not present

## 2022-01-11 DIAGNOSIS — L659 Nonscarring hair loss, unspecified: Secondary | ICD-10-CM | POA: Diagnosis not present

## 2022-01-13 ENCOUNTER — Telehealth: Payer: Self-pay

## 2022-01-13 MED ORDER — GABAPENTIN 300 MG PO CAPS: 300.0000 mg | ORAL_CAPSULE | Freq: Two times a day (BID) | ORAL | 1 refills | Status: DC

## 2022-01-13 NOTE — Telephone Encounter (Signed)
Rx request for Gabapentin

## 2022-01-16 MED ORDER — GABAPENTIN 300 MG PO CAPS: 300.0000 mg | ORAL_CAPSULE | Freq: Two times a day (BID) | ORAL | 1 refills | Status: DC

## 2022-01-16 NOTE — Addendum Note (Signed)
Addended by: Dessa Phi D on: 01/16/2022 12:05 PM   Modules accepted: Orders

## 2022-01-23 DIAGNOSIS — F411 Generalized anxiety disorder: Secondary | ICD-10-CM | POA: Diagnosis not present

## 2022-01-23 DIAGNOSIS — F331 Major depressive disorder, recurrent, moderate: Secondary | ICD-10-CM | POA: Diagnosis not present

## 2022-01-27 DIAGNOSIS — H5319 Other subjective visual disturbances: Secondary | ICD-10-CM | POA: Diagnosis not present

## 2022-01-30 DIAGNOSIS — F411 Generalized anxiety disorder: Secondary | ICD-10-CM | POA: Diagnosis not present

## 2022-01-30 DIAGNOSIS — F331 Major depressive disorder, recurrent, moderate: Secondary | ICD-10-CM | POA: Diagnosis not present

## 2022-02-06 DIAGNOSIS — F331 Major depressive disorder, recurrent, moderate: Secondary | ICD-10-CM | POA: Diagnosis not present

## 2022-02-06 DIAGNOSIS — F902 Attention-deficit hyperactivity disorder, combined type: Secondary | ICD-10-CM | POA: Diagnosis not present

## 2022-02-06 DIAGNOSIS — F332 Major depressive disorder, recurrent severe without psychotic features: Secondary | ICD-10-CM | POA: Diagnosis not present

## 2022-02-06 DIAGNOSIS — F411 Generalized anxiety disorder: Secondary | ICD-10-CM | POA: Diagnosis not present

## 2022-02-06 DIAGNOSIS — F4312 Post-traumatic stress disorder, chronic: Secondary | ICD-10-CM | POA: Diagnosis not present

## 2022-02-08 DIAGNOSIS — S01511A Laceration without foreign body of lip, initial encounter: Secondary | ICD-10-CM | POA: Diagnosis not present

## 2022-02-08 DIAGNOSIS — W540XXA Bitten by dog, initial encounter: Secondary | ICD-10-CM | POA: Diagnosis not present

## 2022-02-09 DIAGNOSIS — L659 Nonscarring hair loss, unspecified: Secondary | ICD-10-CM | POA: Diagnosis not present

## 2022-02-13 DIAGNOSIS — F411 Generalized anxiety disorder: Secondary | ICD-10-CM | POA: Diagnosis not present

## 2022-02-13 DIAGNOSIS — F331 Major depressive disorder, recurrent, moderate: Secondary | ICD-10-CM | POA: Diagnosis not present

## 2022-02-16 ENCOUNTER — Ambulatory Visit (INDEPENDENT_AMBULATORY_CARE_PROVIDER_SITE_OTHER): Payer: Self-pay

## 2022-02-16 ENCOUNTER — Encounter: Payer: Self-pay | Admitting: Orthopedic Surgery

## 2022-02-16 ENCOUNTER — Ambulatory Visit (INDEPENDENT_AMBULATORY_CARE_PROVIDER_SITE_OTHER): Payer: BC Managed Care – PPO | Admitting: Orthopedic Surgery

## 2022-02-16 DIAGNOSIS — Z981 Arthrodesis status: Secondary | ICD-10-CM | POA: Diagnosis not present

## 2022-02-16 NOTE — Progress Notes (Signed)
Orthopedic Surgery Post-operative Progress Note   Assessment: Patient is a 52 y.o. male who is currently admitted after undergoing C4-T1 ACDF with my retired partner, Dr. Louanne Skye. Doing well, still has some soreness in the shoulder especially if he uses them too long. Date of surgery: 08/02/2021 (~6 months post-op)   Plan: -Operative plans complete -Out of bed as tolerated, no brace -Pain control: no new prescriptions today -Activity as tolerated -Already is set up with OT, should continue to work with them for his trapezius and interscapular pain -Follow-up in 6 months; x-rays at next visit: cervical AP/lateral/flex/ex   _________________________________________________________________________   Subjective: Having some pain in his bilateral shoulders. Does not radiate past the shoulders. Feels much better than pre-op. Is working with OT and will continue to work with them. Feels his deltoids are still slightly weaker. No other weakness noted. Overall, please with the result of his surgery.    Objective:   General: no acute distress, appropriate affect Neurologic: alert, answering questions appropriately, following commands Respiratory: unlabored breathing on room air Skin: Incision appears well-healed with no evidence of infection   MSK (spine):   -Strength exam                                                   Right                Left Grip strength                5/5                  5/5 Interosseus                  5/5                  5/5 Wrist extension            5/5                  5/5 Wrist flexion                 5/5                  5/5 Elbow flexion                5/5                  5/5 Deltoid                          5/5                  5/5   EHL                              5/5                  5/5 TA                                 5/5                  5/5 GSC  5/5                  5/5 Knee extension            5/5                   5/5 Hip flexion                    5/5                  5/5   -Sensory exam                           Sensation intact to light touch in L3-S1 nerve distributions of bilateral lower extremities             Sensation intact to light touch in C5-T1 nerve distributions of bilateral upper extremities (decreased in bilateral C6 and C7 distributions)  -Right shoulder exam: No pain through range of motion, negative Jobe, negative Hawkins, no weakness with external rotation with arm at side, negative belly press -Left shoulder exam: No pain through range of motion, pain with Jobe but no weakness, negative Hawkins, no weakness with external rotation with arm at side, negative belly press   X-ray of the cervical spine taken on 02/16/2022 was independently reviewed and interpreted, showing C4-7 ACDF.  There is no lucency around the screws.  No screws are backing out.  Allografts are in place. Alignment maintained.      Patient name: Henry Jennings Patient MRN: [id number] Date: 02/16/22

## 2022-02-17 ENCOUNTER — Telehealth: Payer: Self-pay | Admitting: *Deleted

## 2022-02-17 MED ORDER — GABAPENTIN 300 MG PO CAPS: 300.0000 mg | ORAL_CAPSULE | Freq: Two times a day (BID) | ORAL | 1 refills | Status: DC

## 2022-02-17 NOTE — Progress Notes (Signed)
Erroneous encounter

## 2022-02-17 NOTE — Telephone Encounter (Signed)
Gabapentin refill sent

## 2022-02-20 DIAGNOSIS — F411 Generalized anxiety disorder: Secondary | ICD-10-CM | POA: Diagnosis not present

## 2022-02-20 DIAGNOSIS — F331 Major depressive disorder, recurrent, moderate: Secondary | ICD-10-CM | POA: Diagnosis not present

## 2022-02-27 DIAGNOSIS — F411 Generalized anxiety disorder: Secondary | ICD-10-CM | POA: Diagnosis not present

## 2022-03-02 ENCOUNTER — Encounter: Payer: Self-pay | Admitting: Physical Medicine & Rehabilitation

## 2022-03-02 ENCOUNTER — Encounter
Payer: BC Managed Care – PPO | Attending: Physical Medicine & Rehabilitation | Admitting: Physical Medicine & Rehabilitation

## 2022-03-02 VITALS — BP 134/92 | HR 101 | Ht 66.0 in | Wt 161.0 lb

## 2022-03-02 DIAGNOSIS — M47812 Spondylosis without myelopathy or radiculopathy, cervical region: Secondary | ICD-10-CM | POA: Insufficient documentation

## 2022-03-02 DIAGNOSIS — M797 Fibromyalgia: Secondary | ICD-10-CM | POA: Diagnosis not present

## 2022-03-02 MED ORDER — GABAPENTIN 400 MG PO CAPS: 400.0000 mg | ORAL_CAPSULE | Freq: Two times a day (BID) | ORAL | 5 refills | Status: DC

## 2022-03-02 NOTE — Progress Notes (Signed)
Subjective:    Patient ID: Henry Jennings, male    DOB: [birth year], 52 y.o.   MRN: [id number]  HPI 52 year old male with chronic upper extremity weakness and numbness.  He has a history of cervical stenosis status post multilevel decompression performed by Dr. Vira Browns in August 2023.  No postoperative complications.  He has been on a 10 pound lifting restriction postoperatively.  The patient has been working at [employer] his all of his life and this requires lifting boxes of books in excess of 10 pounds Doing physical therapy and walking program  Stationary bicycling discussed and recommended , pt has one at home  CLINICAL DATA:  C4 through T1 ACDF.  Intraoperative fluoroscopy.   EXAM: CERVICAL SPINE - 2-3 VIEW   COMPARISON:  Cervical spine radiographs 03/25/2021   FINDINGS: Images were performed intraoperatively without the presence of a radiologist. On the first image, markers overlie the anterior C4-5 and anterior C6-7 disc levels. On the second image, there is new C4 through T1 anterior plate and screws with associated intervertebral disc spacers. No hardware complication is seen.   Total fluoroscopy images: 5 images   Total fluoroscopy time: 10 seconds   Total dose: Radiation Exposure Index (as provided by the fluoroscopic device): 1.45 mGy air Kerma   Please see intraoperative findings for further detail.   IMPRESSION: Intraoperative fluoroscopy for C4 through T1 ACDF.     Electronically Signed   By: Neita Garnet M.D.   On: 08/02/2021 13:50    CLINICAL DATA:  Chronic neck pain. Arm weakness and shoulder weakness.   EXAM: MRI CERVICAL SPINE WITHOUT CONTRAST   TECHNIQUE: Multiplanar, multisequence MR imaging of the cervical spine was performed. No intravenous contrast was administered.   COMPARISON:  08/01/2016   FINDINGS: Alignment: 1-2 mm retrolisthesis of C4-C5, C5-C6 and C6 on C7.   Vertebrae: No acute fracture, evidence of discitis, or  aggressive bone lesion.   Cord: Normal signal and morphology.   Posterior Fossa, vertebral arteries, paraspinal tissues: Posterior fossa demonstrates no focal abnormality. Vertebral artery flow voids are maintained. Paraspinal soft tissues are unremarkable.   Disc levels:   Discs: Degenerative disease with disc height loss at C4-5, C5-6 and C6-7.   C2-3: No significant disc bulge. No neural foraminal stenosis. No central canal stenosis. Mild right facet arthropathy.   C3-4: No significant disc bulge. No neural foraminal stenosis. No central canal stenosis.   C4-5: Broad-based disc bulge flattening the ventral thecal sac. Bilateral uncovertebral degenerative changes. Moderate-severe bilateral foraminal stenosis. Mild spinal stenosis.   C5-6: Broad-based disc osteophyte complex. Bilateral uncovertebral degenerative changes. Severe right foraminal stenosis. Moderate-severe left foraminal stenosis. Mild spinal stenosis.   C6-7: Broad-based disc osteophyte complex. Bilateral uncovertebral degenerative changes. Moderate-severe bilateral foraminal stenosis. No spinal stenosis.   C7-T1: Broad-based disc osteophyte complex. Bilateral uncovertebral degenerative changes. Severe bilateral foraminal stenosis. No spinal stenosis.   IMPRESSION: 1. At C4-5 there is a broad-based disc bulge flattening the ventral thecal sac. Bilateral uncovertebral degenerative changes. Moderate-severe bilateral foraminal stenosis. 2. At C5-6 there is a broad-based disc osteophyte complex. Bilateral uncovertebral degenerative changes. Severe right foraminal stenosis. Moderate-severe left foraminal stenosis. Mild spinal stenosis. 3. At C6-7 there is a broad-based disc osteophyte complex. Bilateral uncovertebral degenerative changes. Moderate-severe bilateral foraminal stenosis. 4. At C7-T1 there is a broad-based disc osteophyte complex. Severe bilateral foraminal stenosis. 5. Mild interval progression  of cervical spondylosis compared with 08/01/2016.     Electronically Signed   By: Dillard Cannon.D.  On: 06/12/2021 08:56 Pain Inventory Average Pain 9 Pain Right Now 7 My pain is constant, sharp, burning, dull, tingling, and aching  In the last 24 hours, has pain interfered with the following? General activity 4 Relation with others 0 Enjoyment of life 6 What TIME of day is your pain at its worst? morning  and night Sleep (in general) Fair  Pain is worse with: bending, inactivity, standing, and LIFTING Pain improves with: rest, heat/ice, pacing activities, and medication Relief from Meds: 7  No family history on file. Social History   Socioeconomic History   Marital status: Married    Spouse name: [name]   Number of children: Not on file   Years of education: Not on file   Highest education level: Not on file  Occupational History   Not on file  Tobacco Use   Smoking status: Never   Smokeless tobacco: Never  Vaping Use   Vaping Use: Never used  Substance and Sexual Activity   Alcohol use: Not Currently   Drug use: Never   Sexual activity: Yes  Other Topics Concern   Not on file  Social History Narrative   Not on file   Social Determinants of Health   Financial Resource Strain: Not on file  Food Insecurity: Not on file  Transportation Needs: Not on file  Physical Activity: Not on file  Stress: Not on file  Social Connections: Not on file   Past Surgical History:  Procedure Laterality Date   ANTERIOR CERVICAL DECOMP/DISCECTOMY FUSION N/A 08/02/2021   Procedure: C4-5, C5-6, C6-7, C7-T1 ANTERIOR CERVICAL DISCECTOMY AND FUSION WITH PLATES AND SCREWS;  Surgeon: Kerrin Champagne, MD;  Location: [facility] OR;  Service: Orthopedics;  Laterality: N/A;   colonscopy  2021   WISDOM TOOTH EXTRACTION Left 2020   Left lower   Past Surgical History:  Procedure Laterality Date   ANTERIOR CERVICAL DECOMP/DISCECTOMY FUSION N/A 08/02/2021   Procedure: C4-5, C5-6, C6-7, C7-T1  ANTERIOR CERVICAL DISCECTOMY AND FUSION WITH PLATES AND SCREWS;  Surgeon: Kerrin Champagne, MD;  Location: [facility] OR;  Service: Orthopedics;  Laterality: N/A;   colonscopy  2021   WISDOM TOOTH EXTRACTION Left 2020   Left lower   Past Medical History:  Diagnosis Date   Depression    Herniated disc, cervical    There were no vitals taken for this visit.  Opioid Risk Score:   Fall Risk Score:  `1  Depression screen [facility] 2/9     12/29/2021    2:37 PM 11/04/2021   11:53 AM  Depression screen [facility] 2/9  Decreased Interest 0 1  Down, Depressed, Hopeless 0 2  PHQ - 2 Score 0 3  Altered sleeping  1  Tired, decreased energy  3  Change in appetite  2  Feeling bad or failure about yourself   3  Trouble concentrating  1  Moving slowly or fidgety/restless  1  Suicidal thoughts  0  PHQ-9 Score  14    Review of Systems  Musculoskeletal:  Positive for back pain.       Pain in both shoulders, right leg, right knee, right arm, left arm   Neurological:  Positive for headaches.  All other systems reviewed and are negative.      Objective:   Physical Exam Neurological:     Mental Status: He is alert and oriented to person, place, and time.     Cranial Nerves: No dysarthria.     Motor: No tremor.     Coordination:  Coordination is intact. Coordination normal.     Gait: Gait is intact.     Deep Tendon Reflexes:     Reflex Scores:      Tricep reflexes are 1+ on the right side and 1+ on the left side.      Bicep reflexes are 2+ on the right side and 2+ on the left side.      Brachioradialis reflexes are 2+ on the right side and 2+ on the left side.      Patellar reflexes are 4+ on the right side and 4+ on the left side.      Achilles reflexes are 3+ on the right side and 3+ on the left side.    General no acute distress Mood and affect appropriate He has no tenderness palpation in the upper arm or shoulder Cervical spine planes. Negative impingement sign right shoulder Sensation C8  dermatomal distribution reduced C5 No evidence for area Is able to heel walk and toe       Assessment & Plan:   68.  52 year old male with history of cervical stenosis status post C4-T1 decompression performed 6 months ago by Dr. Otelia Sergeant.  He is recovering well and is still on a lifting restriction imposed by Dr. Otelia Sergeant.  He is planning to follow-up with Dr. Christell Constant who has taken over Dr. Barbaraann Faster practice.  He will see him back at the 1 year mark which will be in August 2024.  At that time he would note restrictions. We discussed his residual symptoms of heaviness in the arms as well as numbness primarily over the right shoulder.  We discussed that this is more likely to be spinal cord compression occurring preoperatively.  His EMG does not show any signs of neuropathy or ulnar neuropathy. He does have decreased recruitment in tricep muscles bilaterally but no significant tricep weakness.  2.  Fibromyalgia syndrome this is also contributing to his overall complaints.  Increase gabapentin to 400 mg twice daily this should help with his paresthesias as well. Recommend regular aerobic exercise such as bicycling.  He does have a stationary bicycle.  I have also made referral to outpatient physical therapy for aquatic exercise Physical medicine rehab follow-up in 3 months

## 2022-03-02 NOTE — Patient Instructions (Signed)
Please call if higher dose of gabapentin is causing drowsiness

## 2022-03-06 DIAGNOSIS — F332 Major depressive disorder, recurrent severe without psychotic features: Secondary | ICD-10-CM | POA: Diagnosis not present

## 2022-03-06 DIAGNOSIS — F902 Attention-deficit hyperactivity disorder, combined type: Secondary | ICD-10-CM | POA: Diagnosis not present

## 2022-03-06 DIAGNOSIS — F4312 Post-traumatic stress disorder, chronic: Secondary | ICD-10-CM | POA: Diagnosis not present

## 2022-04-03 DIAGNOSIS — F411 Generalized anxiety disorder: Secondary | ICD-10-CM | POA: Diagnosis not present

## 2022-04-03 DIAGNOSIS — F331 Major depressive disorder, recurrent, moderate: Secondary | ICD-10-CM | POA: Diagnosis not present

## 2022-06-01 ENCOUNTER — Encounter: Payer: BC Managed Care – PPO | Admitting: Physical Medicine & Rehabilitation

## 2022-06-05 DIAGNOSIS — F332 Major depressive disorder, recurrent severe without psychotic features: Secondary | ICD-10-CM | POA: Diagnosis not present

## 2022-06-05 DIAGNOSIS — F4312 Post-traumatic stress disorder, chronic: Secondary | ICD-10-CM | POA: Diagnosis not present

## 2022-06-05 DIAGNOSIS — F902 Attention-deficit hyperactivity disorder, combined type: Secondary | ICD-10-CM | POA: Diagnosis not present

## 2022-06-06 ENCOUNTER — Encounter: Payer: Self-pay | Admitting: Physical Medicine & Rehabilitation

## 2022-06-06 ENCOUNTER — Encounter
Payer: BC Managed Care – PPO | Attending: Physical Medicine & Rehabilitation | Admitting: Physical Medicine & Rehabilitation

## 2022-06-06 VITALS — BP 137/95 | HR 103 | Ht 66.0 in | Wt 167.2 lb

## 2022-06-06 DIAGNOSIS — M797 Fibromyalgia: Secondary | ICD-10-CM | POA: Diagnosis not present

## 2022-06-06 MED ORDER — GABAPENTIN 400 MG PO CAPS: 400.0000 mg | ORAL_CAPSULE | Freq: Two times a day (BID) | ORAL | 5 refills | Status: DC

## 2022-06-06 MED ORDER — TIZANIDINE HCL 4 MG PO TABS: 4.0000 mg | ORAL_TABLET | Freq: Three times a day (TID) | ORAL | 2 refills | Status: DC

## 2022-06-06 NOTE — Progress Notes (Signed)
Subjective:    Patient ID: Henry Jennings, male    DOB: 1970/01/24, 52 y.o.   MRN: [id number]  HPI 52 year old male with history of cervical myelopathy status post multilevel cervical decompression and fusion performed by Dr. Otelia Sergeant in August 2023.  He plans to follow-up with Dr. Christell Constant in August for his 1 year follow-up. He has diffuse body aches affecting shoulders wrists knees ankles low back and upper back as well as neck.  He has seen rheumatology and was diagnosed with fibromyalgia syndrome after blood work did not show any form of inflammatory arthritis.  He had repeat arthritis panel performed by Dr. Otelia Sergeant some years later which was also negative for inflammatory arthritis. Soreness after watering gardening Patient states that warm weather tends to make his symptoms worse  Aquatic PT referral patient had last been health insurance and therefore did not pursue this  Stationary bike, has 1 at home but has not been using I encouraged him to do so.   Pain Inventory Average Pain 7 Pain Right Now 8 My pain is sharp and stabbing  In the last 24 hours, has pain interfered with the following? General activity 8 Relation with others 6 Enjoyment of life 8 What TIME of day is your pain at its worst? morning  and daytime Sleep (in general) Fair  Pain is worse with: inactivity and some activites Pain improves with: rest, heat/ice, therapy/exercise, and pacing activities Relief from Meds: 5  No family history on file. Social History   Socioeconomic History   Marital status: Married    Spouse name: [name]   Number of children: Not on file   Years of education: Not on file   Highest education level: Not on file  Occupational History   Not on file  Tobacco Use   Smoking status: Never   Smokeless tobacco: Never  Vaping Use   Vaping Use: Never used  Substance and Sexual Activity   Alcohol use: Not Currently   Drug use: Never   Sexual activity: Yes  Other Topics Concern   Not on  file  Social History Narrative   Not on file   Social Determinants of Health   Financial Resource Strain: Not on file  Food Insecurity: Not on file  Transportation Needs: Not on file  Physical Activity: Not on file  Stress: Not on file  Social Connections: Not on file   Past Surgical History:  Procedure Laterality Date   ANTERIOR CERVICAL DECOMP/DISCECTOMY FUSION N/A 08/02/2021   Procedure: C4-5, C5-6, C6-7, C7-T1 ANTERIOR CERVICAL DISCECTOMY AND FUSION WITH PLATES AND SCREWS;  Surgeon: Kerrin Champagne, MD;  Location: [facility] OR;  Service: Orthopedics;  Laterality: N/A;   colonscopy  2021   WISDOM TOOTH EXTRACTION Left 2020   Left lower   Past Surgical History:  Procedure Laterality Date   ANTERIOR CERVICAL DECOMP/DISCECTOMY FUSION N/A 08/02/2021   Procedure: C4-5, C5-6, C6-7, C7-T1 ANTERIOR CERVICAL DISCECTOMY AND FUSION WITH PLATES AND SCREWS;  Surgeon: Kerrin Champagne, MD;  Location: [facility] OR;  Service: Orthopedics;  Laterality: N/A;   colonscopy  2021   WISDOM TOOTH EXTRACTION Left 2020   Left lower   Past Medical History:  Diagnosis Date   Depression    Herniated disc, cervical    BP (!) 136/97   Pulse (!) 103   Ht 5\' 6"  (1.676 m)   Wt 167 lb 3.2 oz (75.8 kg)   SpO2 99%   BMI 26.99 kg/m   Opioid Risk Score:  Fall Risk Score:  `1  Depression screen [facility] 2/9     06/06/2022    2:23 PM 03/02/2022    2:07 PM 12/29/2021    2:37 PM 11/04/2021   11:53 AM  Depression screen PHQ 2/9  Decreased Interest 1 0 0 1  Down, Depressed, Hopeless 1 0 0 2  PHQ - 2 Score 2 0 0 3  Altered sleeping    1  Tired, decreased energy    3  Change in appetite    2  Feeling bad or failure about yourself     3  Trouble concentrating    1  Moving slowly or fidgety/restless    1  Suicidal thoughts    0  PHQ-9 Score    14     Review of Systems  Constitutional: Negative.   HENT: Negative.    Eyes: Negative.   Respiratory: Negative.    Cardiovascular: Negative.   Gastrointestinal: Negative.    Endocrine: Negative.   Genitourinary: Negative.   Musculoskeletal:  Positive for arthralgias, back pain, myalgias and neck pain.  Skin: Negative.   Allergic/Immunologic: Negative.   Neurological: Negative.   Hematological: Negative.   Psychiatric/Behavioral:  Positive for dysphoric mood.   All other systems reviewed and are negative.      Objective:   Physical Exam  General no acute distress Mood and affect appropriate Extremities without edema Hands and wrist no evidence of joint deformities no evidence of finger deformities.  Has full range of motion shoulders elbows wrist fingers. Cervical spine range of motion is limited as expected with approximate 25% flexion extension mainly at the upper cervical spine as well as rotation. Motor strength is 5/5 bilateral deltoid bicep tricep finger flexors and extensors as well as hip flexors knee extensors ankle dorsiflexors. Deep tendon reflexes 3-4+ left biceps and knee extensors a couple beats of clonus at the left ankle.  Triceps is 3+ Right upper extremity is 2+ bicep tricep patellar and ankle.      Assessment & Plan:   1.  Fibromyalgia syndrome this is likely causing his widespread body pain he has had some improvements with gabapentin 400 mg twice daily.  I did emphasize the importance of low impact aerobic exercise on a regular basis.  Recommend starting with stationary bicycle 5 minutes/day and increasing by 5 minutes each week with a goal of 30 minutes/day.  He has residual soreness he may have to do every other day.  2.  Cervical postlaminectomy syndrome he does have residual signs of a cervical myelopathy namely hyper-reactive reflexes.  Will schedule tizanidine 4 mg 3 times daily as sometimes this jumpiness bothers him at night

## 2022-06-15 ENCOUNTER — Ambulatory Visit (INDEPENDENT_AMBULATORY_CARE_PROVIDER_SITE_OTHER): Payer: BC Managed Care – PPO

## 2022-06-15 ENCOUNTER — Ambulatory Visit: Payer: BC Managed Care – PPO | Admitting: Podiatry

## 2022-06-15 ENCOUNTER — Encounter: Payer: Self-pay | Admitting: Podiatry

## 2022-06-15 DIAGNOSIS — M25572 Pain in left ankle and joints of left foot: Secondary | ICD-10-CM | POA: Diagnosis not present

## 2022-06-15 DIAGNOSIS — M79671 Pain in right foot: Secondary | ICD-10-CM

## 2022-06-15 DIAGNOSIS — M79672 Pain in left foot: Secondary | ICD-10-CM | POA: Diagnosis not present

## 2022-06-15 DIAGNOSIS — M7732 Calcaneal spur, left foot: Secondary | ICD-10-CM | POA: Diagnosis not present

## 2022-06-15 DIAGNOSIS — M7731 Calcaneal spur, right foot: Secondary | ICD-10-CM | POA: Diagnosis not present

## 2022-06-15 DIAGNOSIS — M7752 Other enthesopathy of left foot: Secondary | ICD-10-CM | POA: Diagnosis not present

## 2022-06-15 MED ORDER — DEXAMETHASONE SODIUM PHOSPHATE 120 MG/30ML IJ SOLN
4.0000 mg | Freq: Once | INTRAMUSCULAR | Status: AC
Start: 2022-06-15 — End: 2022-06-15
  Administered 2022-06-15: 4 mg via INTRA_ARTICULAR

## 2022-06-15 MED ORDER — TRIAMCINOLONE ACETONIDE 10 MG/ML IJ SUSP
10.0000 mg | Freq: Once | INTRAMUSCULAR | Status: AC
Start: 2022-06-15 — End: 2022-06-15
  Administered 2022-06-15: 10 mg

## 2022-06-15 NOTE — Progress Notes (Signed)
Subjective:  Patient ID: Henry Jennings, male    DOB: April 17, 1970,   MRN: [id number]  Chief Complaint  Patient presents with   Ankle Pain    Left ankle pain on going since August 2024   Foot Pain    Bilateral foot pain     52 y.o. male presents for concern of left ankle pain that has been going on for almost a year and relates pain in the tops of both of his toes that have started since. Relates he feels his ankle will often given way and will hurt when walking and certain positions. Does have back pain and takes diclofenac relates it helps some with ankle but not completely . Denies any other pedal complaints. Denies n/v/f/c.   Past Medical History:  Diagnosis Date   Depression    Herniated disc, cervical     Objective:  Physical Exam: Vascular: DP/PT pulses 2/4 bilateral. CFT <3 seconds. Normal hair growth on digits. No edema.  Skin. No lacerations or abrasions bilateral feet.  Musculoskeletal: MMT 5/5 bilateral lower extremities in DF, PF, Inversion and Eversion. Deceased ROM in DF of ankle joint. Tender around lateral ankle and subtalar laterally on left . Pain with ROM of the ankle and STJ. Pain to palpation of peroneal tendons and tender to dosrum of 1-5 metatarsals bilateral.  Neurological: Sensation intact to light touch.   Assessment:   1. Sinus tarsi syndrome of left foot   2. Pain in both feet   3. Capsulitis of left ankle      Plan:  Patient was evaluated and treated and all questions answered. Discussed capsulitis and inflammation of joint and treatment options with patient.  Radiographs reviewed and discussed with patient. No acute fractures or dislocation noted.  Injection offered today. Patient in agreement.  Discussed supportive shoes.  Trilock dispensed today   Discussed if pain does not improve may consider PT and/or MRI for further surgical planning.  Patient to return in 6 weeks or sooner if concerns arise.   Procedure: Injection  Tendon/Ligament Discussed alternatives, risks, complications and verbal consent was obtained.  Location: Left subtalar joint . Skin Prep: Alcohol. Injectate: 1cc 0.5% marcaine plain, 1 cc dexamethasone 0.5 cc kenalog  Disposition: Patient tolerated procedure well. Injection site dressed with a band-aid.  Post-injection care was discussed and return precautions discussed.     Louann Sjogren, DPM

## 2022-07-24 DIAGNOSIS — F331 Major depressive disorder, recurrent, moderate: Secondary | ICD-10-CM | POA: Diagnosis not present

## 2022-07-24 DIAGNOSIS — F411 Generalized anxiety disorder: Secondary | ICD-10-CM | POA: Diagnosis not present

## 2022-07-24 DIAGNOSIS — F902 Attention-deficit hyperactivity disorder, combined type: Secondary | ICD-10-CM | POA: Diagnosis not present

## 2022-07-27 ENCOUNTER — Ambulatory Visit (INDEPENDENT_AMBULATORY_CARE_PROVIDER_SITE_OTHER): Payer: BC Managed Care – PPO | Admitting: Podiatry

## 2022-07-27 DIAGNOSIS — Z91199 Patient's noncompliance with other medical treatment and regimen due to unspecified reason: Secondary | ICD-10-CM

## 2022-07-27 NOTE — Progress Notes (Signed)
No show

## 2022-07-31 DIAGNOSIS — F902 Attention-deficit hyperactivity disorder, combined type: Secondary | ICD-10-CM | POA: Diagnosis not present

## 2022-07-31 DIAGNOSIS — F411 Generalized anxiety disorder: Secondary | ICD-10-CM | POA: Diagnosis not present

## 2022-07-31 DIAGNOSIS — F331 Major depressive disorder, recurrent, moderate: Secondary | ICD-10-CM | POA: Diagnosis not present

## 2022-08-07 DIAGNOSIS — F332 Major depressive disorder, recurrent severe without psychotic features: Secondary | ICD-10-CM | POA: Diagnosis not present

## 2022-08-07 DIAGNOSIS — F411 Generalized anxiety disorder: Secondary | ICD-10-CM | POA: Diagnosis not present

## 2022-08-07 DIAGNOSIS — F902 Attention-deficit hyperactivity disorder, combined type: Secondary | ICD-10-CM | POA: Diagnosis not present

## 2022-08-07 DIAGNOSIS — F331 Major depressive disorder, recurrent, moderate: Secondary | ICD-10-CM | POA: Diagnosis not present

## 2022-08-07 DIAGNOSIS — F4312 Post-traumatic stress disorder, chronic: Secondary | ICD-10-CM | POA: Diagnosis not present

## 2022-08-14 DIAGNOSIS — F411 Generalized anxiety disorder: Secondary | ICD-10-CM | POA: Diagnosis not present

## 2022-08-14 DIAGNOSIS — F331 Major depressive disorder, recurrent, moderate: Secondary | ICD-10-CM | POA: Diagnosis not present

## 2022-08-14 DIAGNOSIS — F902 Attention-deficit hyperactivity disorder, combined type: Secondary | ICD-10-CM | POA: Diagnosis not present

## 2022-08-17 ENCOUNTER — Other Ambulatory Visit (INDEPENDENT_AMBULATORY_CARE_PROVIDER_SITE_OTHER): Payer: BC Managed Care – PPO

## 2022-08-17 ENCOUNTER — Ambulatory Visit: Payer: BC Managed Care – PPO | Admitting: Orthopedic Surgery

## 2022-08-17 DIAGNOSIS — Z981 Arthrodesis status: Secondary | ICD-10-CM

## 2022-08-17 NOTE — Progress Notes (Signed)
Orthopedic Surgery Progress Note   Assessment: Patient is a 52 y.o. male who is currently admitted after undergoing C4-T1 ACDF with my retired partner, Dr. Otelia Sergeant. Patient has noticed improvement in his symptoms since our last visit. Still some pain in his trapezius especially if very active Date of surgery: 08/02/2021 (~1 year post-op)   Plan: -Operative plans complete -No spine specific restrictions, activity as tolerated -Follow-up in 6 months: X-rays at next visit: AP/lateral/flex/ex cervical   _________________________________________________________________________   Subjective: Patient has been doing well since he was last seen.  He reports that his pain has gotten slightly better since the last time I saw him.  He is doing better than he was preoperatively.  He still notes some pain if he is more active in the bilateral trapezius.  He also feels his arms will give out on him if he is particularly active.  He said he was gardening the other day, for example, and after a few hours his arms would just feel weaker.  He would then rest and be able to go back to it.  He notices the same thing if he is lifting heavier objects.  No other weakness have been noted.  No pain radiating past the trapezius.  Feels that his upper extremity strength is improved since surgery.  General: No acute distress, appropriate affect Neurologic: alert, answering questions appropriately, following commands Respiratory: unlabored breathing on room air Skin: Incision is well healed   MSK (spine):   -Strength exam                                                   Right                Left Grip strength                5/5                  5/5 Interosseus                  5/5                  5/5 Wrist extension            5/5                  5/5 Wrist flexion                 5/5                  5/5 Elbow flexion                5/5                  5/5 Deltoid                          5/5                  5/5    EHL                              5/5                  5/5 TA  5/5                  5/5 GSC                             5/5                  5/5 Knee extension            5/5                  5/5 Hip flexion                    5/5                  5/5   -Sensory exam                           Sensation intact to light touch in L3-S1 nerve distributions of bilateral lower extremities             Sensation intact to light touch in C5-T1 nerve distributions of bilateral upper extremities    -Right shoulder exam: No pain through range of motion, negative Jobe, negative Hawkins, no weakness with external rotation with arm at side, negative belly press -Left shoulder exam: No pain through range of motion, pain with Jobe but no weakness, negative Hawkins, no weakness with external rotation with arm at side, negative belly press   Imaging: X-ray of the cervical spine taken on 08/17/2022 was independently reviewed and interpreted, showing C4-7 ACDF.  Allografts are in appropriate position. Anterior plate and screws are in good position. There is no lucency around the screws or the interbody cages. No fracture or dislocation seen.      Patient name: Henry Jennings Patient MRN: [id number] Date: 08/17/22

## 2022-08-28 ENCOUNTER — Ambulatory Visit (HOSPITAL_BASED_OUTPATIENT_CLINIC_OR_DEPARTMENT_OTHER): Payer: BC Managed Care – PPO | Admitting: Physical Therapy

## 2022-08-28 ENCOUNTER — Other Ambulatory Visit: Payer: Self-pay

## 2022-08-28 ENCOUNTER — Encounter (HOSPITAL_BASED_OUTPATIENT_CLINIC_OR_DEPARTMENT_OTHER): Payer: Self-pay | Admitting: Physical Therapy

## 2022-08-28 DIAGNOSIS — R252 Cramp and spasm: Secondary | ICD-10-CM | POA: Diagnosis not present

## 2022-08-28 DIAGNOSIS — M6281 Muscle weakness (generalized): Secondary | ICD-10-CM

## 2022-08-28 DIAGNOSIS — M542 Cervicalgia: Secondary | ICD-10-CM

## 2022-08-28 DIAGNOSIS — M797 Fibromyalgia: Secondary | ICD-10-CM | POA: Insufficient documentation

## 2022-08-28 NOTE — Therapy (Signed)
OUTPATIENT PHYSICAL THERAPY EVALUATION   Patient Name: Henry Jennings MRN: [id number] DOB:[birth year], 52 y.o., male Today's Date: 08/28/2022  END OF SESSION:  PT End of Session - 08/28/22 1535     Visit Number 1    Number of Visits 13    Date for PT Re-Evaluation 10/28/22    PT Start Time 1535    PT Stop Time 1620    PT Time Calculation (min) 45 min    Activity Tolerance Patient tolerated treatment well    Behavior During Therapy [facility] for tasks assessed/performed             Past Medical History:  Diagnosis Date   Depression    Herniated disc, cervical    Past Surgical History:  Procedure Laterality Date   ANTERIOR CERVICAL DECOMP/DISCECTOMY FUSION N/A 08/02/2021   Procedure: C4-5, C5-6, C6-7, C7-T1 ANTERIOR CERVICAL DISCECTOMY AND FUSION WITH PLATES AND SCREWS;  Surgeon: Kerrin Champagne, MD;  Location: [facility] OR;  Service: Orthopedics;  Laterality: N/A;   colonscopy  2021   WISDOM TOOTH EXTRACTION Left 2020   Left lower   Patient Active Problem List   Diagnosis Date Noted   Spinal stenosis of cervical region    Cervical disc herniation    Other spondylosis with myelopathy, cervical region    S/P cervical spinal fusion 08/02/2021   Anxiety 06/06/2021   Bipolar disorder (HCC) 06/06/2021   Chronic fatigue syndrome 06/06/2021   Fibromyalgia 06/06/2021   Constipation 06/06/2021   Insomnia 06/06/2021   Panic disorder 06/06/2021   Gait abnormality 06/06/2021   Neck pain 06/06/2021    REFERRING PROVIDER: Erick Colace, MD   REFERRING DIAG: M79.7 (ICD-10-CM) - Fibromyalgia   Rationale for Evaluation and Treatment: Rehabilitation  THERAPY DIAG:  Cervicalgia  Muscle weakness (generalized)  ONSET DATE: 2018   SUBJECTIVE:                                                                                                                                                                                           SUBJECTIVE STATEMENT: Increasing issues since 2018.  Went to PT and it seemed to help. Seemed ot get worse over the pandemic. Went to rheumatology due to other symptoms happening. No dx came from those visits. In 2022 was dx with fibromyalgia. ACDF Aug 2023. It took a lot longer to heal from that than I expected. From 2021 and on I was dropping things and things were too heavy but a lot of it has gone away.  After too much lifting or 10+lb and arms will give. Most nerves are fine but there was one on both side  that were limited in NCV testing.  Skin hypersensitivity that can be irritated by certain clothes.    PERTINENT HISTORY:  H/o migraines-sweatband and rams horn pattern  PAIN:  Are you having pain? Yes: NPRS scale: fluctuating, always some/10 Pain location: see description Pain description: Lt ankle and knee, bil shoulders and elbows; variety of symptoms day to day, continuous and fluctuating Aggravating factors: never know Relieving factors: hot shower, CBD, rotate hot and cold, massage  PRECAUTIONS:  None  RED FLAGS: None   WEIGHT BEARING RESTRICTIONS:  No  FALLS:  Has patient fallen in last 6 months?  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    OBJECTIVE:   DIAGNOSTIC FINDINGS:  X-ray of the cervical spine taken on 08/17/2022 was independently reviewed  and interpreted, showing C4-7 ACDF.  Allografts are in appropriate position. Anterior plate and screws are in good position. There is no lucency around the screws or the interbody cages. No fracture or dislocation seen.  From MD note: His EMG does not show any signs of neuropathy or ulnar neuropathy. He does have decreased recruitment in tricep muscles bilaterally but no significant tricep weakness.  PATIENT SURVEYS:  UEFS eval: 46  COGNITIVE STATUS: Within functional limits  for tasks assessed    POSTURE:  increased lumbar lordosis  HAND DOMINANCE:  Right   Body Part #1 Cervical  PALPATION: Eval: more spasm noted in Lt upper traps  UE MMT UPPER EXTREMITY MMT:  MMT Right eval Left eval  Shoulder flexion 12.8 12.4  Shoulder extension    Shoulder abduction    Shoulder adduction    Shoulder extension    Shoulder internal rotation    Shoulder external rotation 11.1 7.7  Middle trapezius    Lower trapezius    Elbow flexion    Elbow extension     (Blank rows = not tested)       Body Part #2 Hip    LE MMT LOWER EXTREMITY MMT:    MMT Right eval Left eval  Hip flexion 19.5 19.8  Hip extension    Hip abduction 23.2 26.5  Hip adduction    Hip internal rotation    Hip external rotation    Knee flexion    Knee extension 26.4 31.3  Ankle dorsiflexion    Ankle plantarflexion    Ankle inversion    Ankle eversion     (Blank rows = not tested)     FUNCTIONAL TESTS:  EVAL: 30 seconds chair stand test 5;    places right foot under chair to press up, does not use UEs   TREATMENT:  DATE: eval 8/26 Discussed aquatic exercise & walking program     PATIENT EDUCATION:  Education details: Anatomy of condition, POC, HEP, exercise form/rationale Person educated: Patient Education method: Explanation, Demonstration, Tactile cues, and Verbal cues Education comprehension: verbalized understanding, returned demonstration, verbal cues required, tactile cues required, and needs further education  HOME EXERCISE PROGRAM: Walking program  Research counselor/psych with chronic pain/fibromyalgia specialty   ASSESSMENT:  CLINICAL IMPRESSION: Patient is a 52 y.o. M who was seen today for physical therapy evaluation and treatment for cervicalgia 1 year s/p ACDF and diffuse pain due to fibromyalgia. Pt moves slowly in  sit>stand with right foot placed behind for push which is consistent with his complaints of recent onset of left lateral knee and ankle pain. Since the onset of COVID pandemic, pt has had a significant decrease in daily activities, going from working 2 full-time jobs to difficulty with a single part-time due to fluctuations in fibromyalgia. We did discuss the neuropsychology component of hypersensitivity and diffuse pain and pt agreed to research a counselor or psychologist with this specialty.  I believe the pt will do very well in the water to return to musculoskeletal strengthening and generalized movement. Communication will be held between pt and PT regarding pain during activity as well as in the days following.     REHAB POTENTIAL: Fair diagnosis  CLINICAL DECISION MAKING: Evolving/moderate complexity  EVALUATION COMPLEXITY: Low   GOALS: Goals reviewed with patient? Yes  SHORT TERM GOALS: Target date: 9/14  Pt will be performing walking program on a daily basis with understanding of rest breaks/modifications necessary Baseline: Goal status: INITIAL    LONG TERM GOALS: Target date: POC date  UEFI to improve by MDC Baseline:  Goal status: INITIAL  2.  Each MMT via hand held dynamometry to increase by 10+lb Baseline:  Goal status: INITIAL  3.  Able to lift objects <10lb throughout his day without dropping them or arms "giving out" Baseline:  Goal status: INITIAL  4.  Pt will verbalize increased tolerance to work activities and postures necessary Baseline:  Goal status: INITIAL     PLAN:  PT FREQUENCY: 1-2x/week  PT DURATION: other: through POC date  PLANNED INTERVENTIONS: Therapeutic exercises, Therapeutic activity, Neuromuscular re-education, Balance training, Gait training, Patient/Family education, Self Care, Joint mobilization, Stair training, Aquatic Therapy, Dry Needling, Spinal mobilization, Cryotherapy, Moist heat, Taping, Ionotophoresis 4mg /ml  Dexamethasone, Manual therapy, and Re-evaluation.  PLAN FOR NEXT SESSION: begin aquatics, how is walking program going?  Graylin Sperling C. Shriyans Kuenzi PT, DPT 08/28/22 7:53 PM

## 2022-08-29 DIAGNOSIS — F331 Major depressive disorder, recurrent, moderate: Secondary | ICD-10-CM | POA: Diagnosis not present

## 2022-08-29 DIAGNOSIS — F411 Generalized anxiety disorder: Secondary | ICD-10-CM | POA: Diagnosis not present

## 2022-08-29 DIAGNOSIS — F902 Attention-deficit hyperactivity disorder, combined type: Secondary | ICD-10-CM | POA: Diagnosis not present

## 2022-08-30 ENCOUNTER — Ambulatory Visit (HOSPITAL_BASED_OUTPATIENT_CLINIC_OR_DEPARTMENT_OTHER): Payer: BC Managed Care – PPO | Admitting: Physical Therapy

## 2022-08-30 ENCOUNTER — Encounter (HOSPITAL_BASED_OUTPATIENT_CLINIC_OR_DEPARTMENT_OTHER): Payer: Self-pay | Admitting: Physical Therapy

## 2022-08-30 DIAGNOSIS — M6281 Muscle weakness (generalized): Secondary | ICD-10-CM

## 2022-08-30 DIAGNOSIS — R252 Cramp and spasm: Secondary | ICD-10-CM

## 2022-08-30 DIAGNOSIS — M542 Cervicalgia: Secondary | ICD-10-CM

## 2022-08-30 DIAGNOSIS — M797 Fibromyalgia: Secondary | ICD-10-CM | POA: Diagnosis not present

## 2022-08-30 NOTE — Therapy (Signed)
OUTPATIENT PHYSICAL THERAPY TREATMENT   Patient Name: Henry Jennings MRN: [id number] DOB:October 18, 1970, 52 y.o., male Today's Date: 08/30/2022  END OF SESSION:  PT End of Session - 08/30/22 1143     Visit Number 2    Number of Visits 13    Date for PT Re-Evaluation 10/28/22    PT Start Time 1119    PT Stop Time 1159    PT Time Calculation (min) 40 min    Activity Tolerance Patient tolerated treatment well    Behavior During Therapy [facility] for tasks assessed/performed             Past Medical History:  Diagnosis Date   Depression    Herniated disc, cervical    Past Surgical History:  Procedure Laterality Date   ANTERIOR CERVICAL DECOMP/DISCECTOMY FUSION N/A 08/02/2021   Procedure: C4-5, C5-6, C6-7, C7-T1 ANTERIOR CERVICAL DISCECTOMY AND FUSION WITH PLATES AND SCREWS;  Surgeon: Kerrin Champagne, MD;  Location: [facility] OR;  Service: Orthopedics;  Laterality: N/A;   colonscopy  2021   WISDOM TOOTH EXTRACTION Left 2020   Left lower   Patient Active Problem List   Diagnosis Date Noted   Spinal stenosis of cervical region    Cervical disc herniation    Other spondylosis with myelopathy, cervical region    S/P cervical spinal fusion 08/02/2021   Anxiety 06/06/2021   Bipolar disorder (HCC) 06/06/2021   Chronic fatigue syndrome 06/06/2021   Fibromyalgia 06/06/2021   Constipation 06/06/2021   Insomnia 06/06/2021   Panic disorder 06/06/2021   Gait abnormality 06/06/2021   Neck pain 06/06/2021    REFERRING PROVIDER: Erick Colace, MD   REFERRING DIAG: M79.7 (ICD-10-CM) - Fibromyalgia   Rationale for Evaluation and Treatment: Rehabilitation  THERAPY DIAG:  Cervicalgia  Muscle weakness (generalized)  Cramp and spasm  ONSET DATE: 2018   SUBJECTIVE:                                                                                                                                                                                           SUBJECTIVE STATEMENT: Pt reports he  feels like he has had a flare up of symptoms over last 2 days.  Lt neck and shoulder have been more painful.   FROM EVAL: Increasing issues since 2018. Went to PT and it seemed to help. Seemed ot get worse over the pandemic. Went to rheumatology due to other symptoms happening. No dx came from those visits. In 2022 was dx with fibromyalgia. ACDF Aug 2023. It took a lot longer to heal from that than I expected. From 2021 and on I was dropping things and  things were too heavy but a lot of it has gone away.  After too much lifting or 10+lb and arms will give. Most nerves are fine but there was one on both side that were limited in NCV testing.  Skin hypersensitivity that can be irritated by certain clothes.    PERTINENT HISTORY:  H/o migraines-sweatband and rams horn pattern  PAIN:  Are you having pain? Yes: NPRS scale: 4/10 Pain location: generalized Pain description: achy, sharp with certain motions Aggravating factors: never know Relieving factors: hot shower, CBD, rotate hot and cold, massage  PRECAUTIONS:  None  RED FLAGS: None   WEIGHT BEARING RESTRICTIONS:  No  FALLS:  Has patient fallen in last 6 months?  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    OBJECTIVE:   DIAGNOSTIC FINDINGS:  X-ray of the cervical spine taken on 08/17/2022 was independently reviewed  and interpreted, showing C4-7 ACDF.  Allografts are in appropriate position. Anterior plate and screws are in good position. There is no lucency around the screws or the interbody cages. No fracture or dislocation seen.  From MD note: His EMG does not show any signs of neuropathy or ulnar neuropathy. He does have decreased recruitment in tricep muscles bilaterally but no significant tricep weakness.  PATIENT SURVEYS:   UEFS eval: 46  COGNITIVE STATUS: Within functional limits for tasks assessed    POSTURE:  increased lumbar lordosis  HAND DOMINANCE:  Right   Body Part #1 Cervical  PALPATION: Eval: more spasm noted in Lt upper traps  UE MMT UPPER EXTREMITY MMT:  MMT Right eval Left eval  Shoulder flexion 12.8 12.4  Shoulder extension    Shoulder abduction    Shoulder adduction    Shoulder extension    Shoulder internal rotation    Shoulder external rotation 11.1 7.7  Middle trapezius    Lower trapezius    Elbow flexion    Elbow extension     (Blank rows = not tested)       Body Part #2 Hip    LE MMT LOWER EXTREMITY MMT:    MMT Right eval Left eval  Hip flexion 19.5 19.8  Hip extension    Hip abduction 23.2 26.5  Hip adduction    Hip internal rotation    Hip external rotation    Knee flexion    Knee extension 26.4 31.3  Ankle dorsiflexion    Ankle plantarflexion    Ankle inversion    Ankle eversion     (Blank rows = not tested)     FUNCTIONAL TESTS:  EVAL: 30 seconds chair stand test 5;    places right foot under chair to press up, does not use UEs   TREATMENT:  DATE: 8/28 Pt seen for aquatic therapy today.  Treatment took place in water 3.5-4.75 ft in depth at the [facility] pool. Temp of water was 91.  Pt entered/exited the pool via stairs independently with bilat rail.  * intro to aquatic properties  * unsupported walking forward / backward with reciprocal arm swing x 3 laps * side stepping with arm addct/ abdct with cues for step height 3 laps  *with rainbow hand floats:  staggered stance with horiz abdct/ addct;  tricep press down x 10;  shoulder addct x 5 * bow and arrow with step back motion x 5 each side * return to walking forward backward with row motion with rainbow hand floats; walking with reciprocal arm  swing   Pt requires the buoyancy and hydrostatic pressure of water for support, and to offload joints by unweighting joint load by at least 50 % in navel deep water and by at least 75-80% in chest to neck deep water.  Viscosity of the water is needed for resistance of strengthening. Water current perturbations provides challenge to standing balance requiring increased core activation.      PATIENT EDUCATION:  Education details:exercise form/rationale Person educated: Patient Education method: Explanation, Demonstration, Actor cues, and Verbal cues Education comprehension: verbalized understanding, returned demonstration, verbal cues required, tactile cues required, and needs further education  HOME EXERCISE PROGRAM: Walking program  Research counselor/psych with chronic pain/fibromyalgia specialty   ASSESSMENT:  CLINICAL IMPRESSION: Pt is safe and independent in aquatic setting; able to take direction from therapist on deck.  Pt is observed moving in slow, cautious manner; completes session 80% submerged.  He reported slight increase in pain with tricep press down; all other exercises tolerated well.  Pain remained 4/10 during session.  Goals are ongoing.     FROM EVAL: Patient is a 52 y.o. M who was seen today for physical therapy evaluation and treatment for cervicalgia 1 year s/p ACDF and diffuse pain due to fibromyalgia. Pt moves slowly in sit>stand with right foot placed behind for push which is consistent with his complaints of recent onset of left lateral knee and ankle pain. Since the onset of COVID pandemic, pt has had a significant decrease in daily activities, going from working 2 full-time jobs to difficulty with a single part-time due to fluctuations in fibromyalgia. We did discuss the neuropsychology component of hypersensitivity and diffuse pain and pt agreed to research a counselor or psychologist with this specialty.  I believe the pt will do very well in the water to  return to musculoskeletal strengthening and generalized movement. Communication will be held between pt and PT regarding pain during activity as well as in the days following.     REHAB POTENTIAL: Fair diagnosis  CLINICAL DECISION MAKING: Evolving/moderate complexity  EVALUATION COMPLEXITY: Low   GOALS: Goals reviewed with patient? Yes  SHORT TERM GOALS: Target date: 9/14  Pt will be performing walking program on a daily basis with understanding of rest breaks/modifications necessary Baseline: Goal status: INITIAL    LONG TERM GOALS: Target date: POC date  UEFI to improve by MDC Baseline:  Goal status: INITIAL  2.  Each MMT via hand held dynamometry to increase by 10+lb Baseline:  Goal status: INITIAL  3.  Able to lift objects <10lb throughout his day without dropping them or arms "giving out" Baseline:  Goal status: INITIAL  4.  Pt will verbalize increased tolerance to work activities and postures necessary Baseline:  Goal status: INITIAL  PLAN:  PT FREQUENCY: 1-2x/week  PT DURATION: other: through POC date  PLANNED INTERVENTIONS: Therapeutic exercises, Therapeutic activity, Neuromuscular re-education, Balance training, Gait training, Patient/Family education, Self Care, Joint mobilization, Stair training, Aquatic Therapy, Dry Needling, Spinal mobilization, Cryotherapy, Moist heat, Taping, Ionotophoresis 4mg /ml Dexamethasone, Manual therapy, and Re-evaluation.  PLAN FOR NEXT SESSION: begin aquatics, how is walking program going?  Mayer Camel, PTA 08/30/22 11:58 AM [facility] [address] [place], [place], [address] Phone: [phone]   Fax:  [phone]

## 2022-09-04 DIAGNOSIS — F411 Generalized anxiety disorder: Secondary | ICD-10-CM | POA: Diagnosis not present

## 2022-09-04 DIAGNOSIS — F332 Major depressive disorder, recurrent severe without psychotic features: Secondary | ICD-10-CM | POA: Diagnosis not present

## 2022-09-04 DIAGNOSIS — F331 Major depressive disorder, recurrent, moderate: Secondary | ICD-10-CM | POA: Diagnosis not present

## 2022-09-04 DIAGNOSIS — F902 Attention-deficit hyperactivity disorder, combined type: Secondary | ICD-10-CM | POA: Diagnosis not present

## 2022-09-04 DIAGNOSIS — F4312 Post-traumatic stress disorder, chronic: Secondary | ICD-10-CM | POA: Diagnosis not present

## 2022-09-07 ENCOUNTER — Encounter: Payer: Self-pay | Admitting: Physical Medicine & Rehabilitation

## 2022-09-07 ENCOUNTER — Encounter
Payer: BC Managed Care – PPO | Attending: Physical Medicine & Rehabilitation | Admitting: Physical Medicine & Rehabilitation

## 2022-09-07 VITALS — BP 121/83 | HR 94 | Ht 66.0 in | Wt 173.0 lb

## 2022-09-07 DIAGNOSIS — M797 Fibromyalgia: Secondary | ICD-10-CM | POA: Insufficient documentation

## 2022-09-07 DIAGNOSIS — Z981 Arthrodesis status: Secondary | ICD-10-CM | POA: Insufficient documentation

## 2022-09-07 DIAGNOSIS — M7542 Impingement syndrome of left shoulder: Secondary | ICD-10-CM | POA: Diagnosis not present

## 2022-09-07 MED ORDER — TIZANIDINE HCL 4 MG PO TABS: 4.0000 mg | ORAL_TABLET | Freq: Three times a day (TID) | ORAL | 2 refills | Status: DC

## 2022-09-07 NOTE — Progress Notes (Signed)
Subjective:    Patient ID: Henry Jennings, male    DOB: [birth year], 52 y.o.   MRN: [id number]  HPI 52 year old male with history of cervical spinal stenosis status post cervical fusion approximately 1 year ago by Dr. Otelia Sergeant.  In addition to neck pain he was experiencing right arm numbness and mild weakness.  He has had good postoperative recovery and has been seen by orthopedic spine surgery about 2 weeks ago.  No surgical concerns.  The patient has an interval time being starting with some aquatic therapy.  He is also noted left shoulder pain.  He has not had any falls or trauma to the area.  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 In addition to left greater than right shoulder pain the patient has had bilateral elbow wrist knee pain.  Average pain is rated as moderate.  Worse with walking and sitting and standing too much of any particular movement.  He does better when he is able to alternate activities.  Overall doing ok but has   Pain Inventory Average Pain 5 Pain Right Now 7 My pain is constant, sharp, dull, stabbing, and tingling  In the last 24 hours, has pain interfered with the following? General activity 7 Relation with others 6 Enjoyment of life 7 What TIME of day is your pain at its worst? evening Sleep (in general) Fair  Pain is worse with: walking, sitting, standing, and some activites Pain improves with: heat/ice, pacing activities, and medication Relief from Meds: 5  History reviewed. No pertinent family history. Social History   Socioeconomic History   Marital status: Married    Spouse name: [name]   Number of children: Not on file   Years of education: Not on file   Highest education level: Not on file  Occupational History   Not on file  Tobacco Use   Smoking status: Never   Smokeless tobacco: Never  Vaping Use   Vaping status: Never Used  Substance and Sexual Activity   Alcohol use: Not Currently   Drug use: Never    Sexual activity: Yes  Other Topics Concern   Not on file  Social History Narrative   Not on file   Social Determinants of Health   Financial Resource Strain: Not on file  Food Insecurity: Not on file  Transportation Needs: Not on file  Physical Activity: Not on file  Stress: Not on file  Social Connections: Not on file   Past Surgical History:  Procedure Laterality Date   ANTERIOR CERVICAL DECOMP/DISCECTOMY FUSION N/A 08/02/2021   Procedure: C4-5, C5-6, C6-7, C7-T1 ANTERIOR CERVICAL DISCECTOMY AND FUSION WITH PLATES AND SCREWS;  Surgeon: Kerrin Champagne, MD;  Location: [facility] OR;  Service: Orthopedics;  Laterality: N/A;   colonscopy  2021   WISDOM TOOTH EXTRACTION Left 2020   Left lower   Past Surgical History:  Procedure Laterality Date   ANTERIOR CERVICAL DECOMP/DISCECTOMY FUSION N/A 08/02/2021   Procedure: C4-5, C5-6, C6-7, C7-T1 ANTERIOR CERVICAL DISCECTOMY AND FUSION WITH PLATES AND SCREWS;  Surgeon: Kerrin Champagne, MD;  Location: [facility] OR;  Service: Orthopedics;  Laterality: N/A;   colonscopy  2021   WISDOM TOOTH EXTRACTION Left 2020   Left lower   Past Medical History:  Diagnosis Date   Depression    Herniated disc, cervical    BP 121/83   Pulse 94   Ht 5\' 6"  (1.676 m)   Wt 173 lb (78.5 kg)   SpO2 91%  BMI 27.92 kg/m   Opioid Risk Score:   Fall Risk Score:  `1  Depression screen PHQ 2/9     06/06/2022    2:23 PM 03/02/2022    2:07 PM 12/29/2021    2:37 PM 11/04/2021   11:53 AM  Depression screen PHQ 2/9  Decreased Interest 1 0 0 1  Down, Depressed, Hopeless 1 0 0 2  PHQ - 2 Score 2 0 0 3  Altered sleeping    1  Tired, decreased energy    3  Change in appetite    2  Feeling bad or failure about yourself     3  Trouble concentrating    1  Moving slowly or fidgety/restless    1  Suicidal thoughts    0  PHQ-9 Score    14     Review of Systems  Musculoskeletal:  Positive for back pain and neck pain.       Bilateral shoulder pain Bilateral knee  pain Bilateral wrist pain Left ankle pain  All other systems reviewed and are negative.     Objective:   Physical Exam Vitals reviewed.  Constitutional:      Appearance: He is normal weight.  HENT:     Head: Normocephalic and atraumatic.  Eyes:     Extraocular Movements: Extraocular movements intact.     Conjunctiva/sclera: Conjunctivae normal.     Pupils: Pupils are equal, round, and reactive to light.  Musculoskeletal:     Right lower leg: No edema.     Left lower leg: No edema.  Skin:    General: Skin is warm and dry.  Neurological:     Mental Status: He is alert and oriented to person, place, and time.  Psychiatric:        Mood and Affect: Mood normal.        Behavior: Behavior normal.    General no acute distress mood and affect appropriate  Motor strength is 5/5 bilateral deltoid bicep tricep grip Sensations equal bilateral upper extremities to light touch Has some tenderness palpation along the left greater than right trapezius. Positive impingement sign on the left side.  No shoulder deformity such as subluxation or dislocation.  No tenderness over the A/C joint.     Assessment & Plan:   1.  History of cervical spinal stenosis status post decompression and fusion approximately 1 year post.  Has had excellent recovery  2.  Left shoulder impingement syndrome will ask PT to address.  If no better in 1 month would consider subacromial injection

## 2022-09-07 NOTE — Patient Instructions (Signed)
Shoulder Impingement Syndrome  Shoulder impingement syndrome is a condition that causes pain when the tendons of the rotator cuff become pinched. These tendons arise from the muscles that stabilize and move the shoulder joint (rotator cuff). Beneath the rotator cuff tendons is a fluid-filled sac (bursa) that allows the tendons to glide smoothly. The bursa may become swollen or irritated (bursitis). Bursitis, swelling in the rotator cuff tendons, or both conditions can decrease the amount of space under a bone in the shoulder joint (acromion), resulting in impingement. What are the causes? This condition may be caused by bursitis or swelling of the rotator cuff tendons, which may result from: Doing repeated overhead arm movements. Falling onto the shoulder. Weakness in the shoulder muscles. What increases the risk? You may be more likely to develop this condition if you: Play sports that involve throwing, such as baseball. Participate in sports such as tennis, volleyball, and swimming. Work as a Education administrator, Music therapist, or Pharmacologist. Some people are also more likely to develop this condition because of the shape of their acromion bone. What are the signs or symptoms? The main symptom of this condition is pain on the front or side of the shoulder. The pain may: Get worse when lifting or raising your arm. Get worse at night. Wake you up from sleeping. Feel sharp when the shoulder is moved and then fade to an ache. Other symptoms may include: Stiffness. Not being able to raise the arm above shoulder level or behind the body. Weakness. How is this diagnosed? This condition may be diagnosed based on: Your symptoms and medical history. A physical exam. Imaging tests, such as: X-rays. MRI. Ultrasound. How is this treated? This condition may be treated by: Resting your shoulder and avoiding all activities that cause pain or put stress on the shoulder. Putting ice on your shoulder. Taking  NSAIDs, such as ibuprofen, to help reduce pain and swelling. Having injections of medicines to numb the area and reduce inflammation. Doing exercises to improve movement and strength in your shoulder (physical therapy). Having surgery. This may be needed if other treatments have not helped. Surgery may involve repairing the rotator cuff, reshaping the acromion, or removing the bursa. Follow these instructions at home: Managing pain, stiffness, and swelling     If told, put ice on the injured area. Put ice in a plastic bag. Place a towel between your skin and the bag. Leave the ice on for 20 minutes, 2-3 times a day. If told, apply heat to the affected area as often as told by your health care provider. Use the heat source that your provider recommends, such as a moist heat pack or a heating pad. Place a towel between your skin and the heat source. Leave the heat on for 20-30 minutes. Remove the heat if your skin turns bright red. This is especially important if you are unable to feel pain, heat, or cold. You have a greater risk of getting burned. If your skin turns bright red, remove the ice or heat right away to prevent skin damage. The risk of damage is higher if you cannot feel pain, heat, or cold. Activity Avoid raising or lifting your arm above your affected shoulder as told. Rest as told by your provider. Return to your normal activities as told by your provider. Ask your provider what activities are safe for you. Do exercises as told by your provider. Ask your provider when it is safe for you to drive. General instructions Take over-the-counter and prescription  medicines only as told by your provider. Do not use any products that contain nicotine or tobacco. These products include cigarettes, chewing tobacco, and vaping devices, such as e-cigarettes. These can delay bone healing. If you need help quitting, ask your provider. Keep all follow-up visits. Your provider will check on  your condition and adjust treatments as needed. How is this prevented? Give your body time to rest between periods of activity. Be safe and responsible while being active. This will help you avoid falls. Maintain physical fitness. This includes strength in your shoulder muscles and back muscles. Contact a health care provider if: You cannot lift your arm away from your body. Your symptoms are getting worse. Your symptoms have not improved after 4-6 months of treatment. This information is not intended to replace advice given to you by your health care provider. Make sure you discuss any questions you have with your health care provider. Document Revised: 09/15/2021 Document Reviewed: 09/15/2021 Elsevier Patient Education  2024 [employer].

## 2022-09-11 DIAGNOSIS — F902 Attention-deficit hyperactivity disorder, combined type: Secondary | ICD-10-CM | POA: Diagnosis not present

## 2022-09-11 DIAGNOSIS — F411 Generalized anxiety disorder: Secondary | ICD-10-CM | POA: Diagnosis not present

## 2022-09-11 DIAGNOSIS — F331 Major depressive disorder, recurrent, moderate: Secondary | ICD-10-CM | POA: Diagnosis not present

## 2022-09-18 DIAGNOSIS — F902 Attention-deficit hyperactivity disorder, combined type: Secondary | ICD-10-CM | POA: Diagnosis not present

## 2022-09-18 DIAGNOSIS — F411 Generalized anxiety disorder: Secondary | ICD-10-CM | POA: Diagnosis not present

## 2022-09-18 DIAGNOSIS — F331 Major depressive disorder, recurrent, moderate: Secondary | ICD-10-CM | POA: Diagnosis not present

## 2022-09-19 ENCOUNTER — Ambulatory Visit (HOSPITAL_BASED_OUTPATIENT_CLINIC_OR_DEPARTMENT_OTHER): Payer: BC Managed Care – PPO | Admitting: Physical Therapy

## 2022-09-27 ENCOUNTER — Ambulatory Visit (HOSPITAL_BASED_OUTPATIENT_CLINIC_OR_DEPARTMENT_OTHER): Payer: BC Managed Care – PPO | Attending: Physical Medicine & Rehabilitation | Admitting: Physical Therapy

## 2022-09-27 ENCOUNTER — Encounter (HOSPITAL_BASED_OUTPATIENT_CLINIC_OR_DEPARTMENT_OTHER): Payer: Self-pay | Admitting: Physical Therapy

## 2022-09-27 DIAGNOSIS — R252 Cramp and spasm: Secondary | ICD-10-CM | POA: Insufficient documentation

## 2022-09-27 DIAGNOSIS — M6281 Muscle weakness (generalized): Secondary | ICD-10-CM | POA: Insufficient documentation

## 2022-09-27 DIAGNOSIS — M542 Cervicalgia: Secondary | ICD-10-CM | POA: Insufficient documentation

## 2022-09-27 NOTE — Therapy (Signed)
OUTPATIENT PHYSICAL THERAPY TREATMENT   Patient Name: Henry Jennings MRN: [id number] DOB:26-Jul-1970, 52 y.o., male Today's Date: 09/27/2022  END OF SESSION:  PT End of Session - 09/27/22 1506     Visit Number 3    Number of Visits 13    Date for PT Re-Evaluation 10/28/22    PT Start Time 1502    PT Stop Time 1545    PT Time Calculation (min) 43 min    Activity Tolerance Patient tolerated treatment well    Behavior During Therapy [facility] for tasks assessed/performed             Past Medical History:  Diagnosis Date   Depression    Herniated disc, cervical    Past Surgical History:  Procedure Laterality Date   ANTERIOR CERVICAL DECOMP/DISCECTOMY FUSION N/A 08/02/2021   Procedure: C4-5, C5-6, C6-7, C7-T1 ANTERIOR CERVICAL DISCECTOMY AND FUSION WITH PLATES AND SCREWS;  Surgeon: Kerrin Champagne, MD;  Location: [facility] OR;  Service: Orthopedics;  Laterality: N/A;   colonscopy  2021   WISDOM TOOTH EXTRACTION Left 2020   Left lower   Patient Active Problem List   Diagnosis Date Noted   Spinal stenosis of cervical region    Cervical disc herniation    Other spondylosis with myelopathy, cervical region    S/P cervical spinal fusion 08/02/2021   Anxiety 06/06/2021   Bipolar disorder (HCC) 06/06/2021   Chronic fatigue syndrome 06/06/2021   Fibromyalgia 06/06/2021   Constipation 06/06/2021   Insomnia 06/06/2021   Panic disorder 06/06/2021   Gait abnormality 06/06/2021   Neck pain 06/06/2021    REFERRING PROVIDER: Erick Colace, MD   REFERRING DIAG: M79.7 (ICD-10-CM) - Fibromyalgia   Rationale for Evaluation and Treatment: Rehabilitation  THERAPY DIAG:  Cervicalgia  Muscle weakness (generalized)  Cramp and spasm  ONSET DATE: 2018   SUBJECTIVE:                                                                                                                                                                                           SUBJECTIVE STATEMENT: Pt reports  being more tired after last aquatic session than he expected. Some increase in shoulder discomfort "but I think I was tense the whole time I was here."  FROM EVAL: Increasing issues since 2018. Went to PT and it seemed to help. Seemed ot get worse over the pandemic. Went to rheumatology due to other symptoms happening. No dx came from those visits. In 2022 was dx with fibromyalgia. ACDF Aug 2023. It took a lot longer to heal from that than I expected. From 2021 and on I was dropping  things and things were too heavy but a lot of it has gone away.  After too much lifting or 10+lb and arms will give. Most nerves are fine but there was one on both side that were limited in NCV testing.  Skin hypersensitivity that can be irritated by certain clothes.    PERTINENT HISTORY:  H/o migraines-sweatband and rams horn pattern  PAIN:  Are you having pain? Yes: NPRS scale: 4.5/10 Pain location: generalized Pain description: achy, sharp with certain motions Aggravating factors: never know Relieving factors: hot shower, CBD, rotate hot and cold, massage  PRECAUTIONS:  None  RED FLAGS: None   WEIGHT BEARING RESTRICTIONS:  No  FALLS:  Has patient fallen in last 6 months?  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    OBJECTIVE:   DIAGNOSTIC FINDINGS:  X-ray of the cervical spine taken on 08/17/2022 was independently reviewed  and interpreted, showing C4-7 ACDF.  Allografts are in appropriate position. Anterior plate and screws are in good position. There is no lucency around the screws or the interbody cages. No fracture or dislocation seen.  From MD note: His EMG does not show any signs of neuropathy or ulnar neuropathy. He does have decreased recruitment in tricep muscles bilaterally but no  significant tricep weakness.  PATIENT SURVEYS:  UEFS eval: 46  COGNITIVE STATUS: Within functional limits for tasks assessed    POSTURE:  increased lumbar lordosis  HAND DOMINANCE:  Right   Body Part #1 Cervical  PALPATION: Eval: more spasm noted in Lt upper traps  UE MMT UPPER EXTREMITY MMT:  MMT Right eval Left eval  Shoulder flexion 12.8 12.4  Shoulder extension    Shoulder abduction    Shoulder adduction    Shoulder extension    Shoulder internal rotation    Shoulder external rotation 11.1 7.7  Middle trapezius    Lower trapezius    Elbow flexion    Elbow extension     (Blank rows = not tested)       Body Part #2 Hip    LE MMT LOWER EXTREMITY MMT:    MMT Right eval Left eval  Hip flexion 19.5 19.8  Hip extension    Hip abduction 23.2 26.5  Hip adduction    Hip internal rotation    Hip external rotation    Knee flexion    Knee extension 26.4 31.3  Ankle dorsiflexion    Ankle plantarflexion    Ankle inversion    Ankle eversion     (Blank rows = not tested)     FUNCTIONAL TESTS:  EVAL: 30 seconds chair stand test 5;    places right foot under chair to press up, does not use UEs   TREATMENT:  DATE: 09/27/22 Pt seen for aquatic therapy today.  Treatment took place in water 3.5-4.75 ft in depth at the [facility] pool. Temp of water was 91.  Pt entered/exited the pool via stairs independently with bilat rail.   * unsupported walking forward / backward with reciprocal arm swing  x 4 widths ea * side stepping with arm addct/ abdct with cues for step height 4 laps  *cycling on  noodle ue support HB x 6 widths; hip add/abd; skiing *with rainbow hand floats:  staggered stance with horiz abdct/ addct 2x5;  tricep press down x 10;   *wall push ups * bow and arrow with step back motion x 7 each side *shoulder  stretching * return to walking forward backward with row motion with rainbow hand floats; walking with reciprocal arm swing   Pt requires the buoyancy and hydrostatic pressure of water for support, and to offload joints by unweighting joint load by at least 50 % in navel deep water and by at least 75-80% in chest to neck deep water.  Viscosity of the water is needed for resistance of strengthening. Water current perturbations provides challenge to standing balance requiring increased core activation.      PATIENT EDUCATION:  Education details:exercise form/rationale Person educated: Patient Education method: Explanation, Demonstration, Tactile cues, and Verbal cues Education comprehension: verbalized understanding, returned demonstration, verbal cues required, tactile cues required, and needs further education  HOME EXERCISE PROGRAM: Walking program  Research counselor/psych with chronic pain/fibromyalgia specialty   ASSESSMENT:  CLINICAL IMPRESSION: Fairly good response to last aquatic session with only minor shoulder discomfort.  He demonstrates less guarding and improved pacing of movement today, reports feeling less tense (likely due to less apprehension). He reports compliance with walking program daily covering ~ 1/2 mile and is working up towards 2 miles. He tolerates session well with added cycling and ue exercises.  Reports slight reduction in pain by 1 point.  He is a good candidate for aquatic therapy intervention and will benefit using the properties of water to progress towards land based goals.     FROM EVAL: Patient is a 52 y.o. M who was seen today for physical therapy evaluation and treatment for cervicalgia 1 year s/p ACDF and diffuse pain due to fibromyalgia. Pt moves slowly in sit>stand with right foot placed behind for push which is consistent with his complaints of recent onset of left lateral knee and ankle pain. Since the onset of COVID pandemic, pt has had a  significant decrease in daily activities, going from working 2 full-time jobs to difficulty with a single part-time due to fluctuations in fibromyalgia. We did discuss the neuropsychology component of hypersensitivity and diffuse pain and pt agreed to research a counselor or psychologist with this specialty.  I believe the pt will do very well in the water to return to musculoskeletal strengthening and generalized movement. Communication will be held between pt and PT regarding pain during activity as well as in the days following.     REHAB POTENTIAL: Fair diagnosis  CLINICAL DECISION MAKING: Evolving/moderate complexity  EVALUATION COMPLEXITY: Low   GOALS: Goals reviewed with patient? Yes  SHORT TERM GOALS: Target date: 9/14  Pt will be performing walking program on a daily basis with understanding of rest breaks/modifications necessary Baseline: Goal status: Met 09/27/22    LONG TERM GOALS: Target date: POC date  UEFI to improve by MDC Baseline:  Goal status: INITIAL  2.  Each MMT via hand held dynamometry to increase by  10+lb Baseline:  Goal status: INITIAL  3.  Able to lift objects <10lb throughout his day without dropping them or arms "giving out" Baseline:  Goal status: INITIAL  4.  Pt will verbalize increased tolerance to work activities and postures necessary Baseline:  Goal status: INITIAL     PLAN:  PT FREQUENCY: 1-2x/week  PT DURATION: other: through POC date  PLANNED INTERVENTIONS: Therapeutic exercises, Therapeutic activity, Neuromuscular re-education, Balance training, Gait training, Patient/Family education, Self Care, Joint mobilization, Stair training, Aquatic Therapy, Dry Needling, Spinal mobilization, Cryotherapy, Moist heat, Taping, Ionotophoresis 4mg /ml Dexamethasone, Manual therapy, and Re-evaluation.  PLAN FOR NEXT SESSION: begin aquatics, how is walking program going?  [address] [place]) Loukisha Gunnerson MPT 09/27/22 3:08 PM [facility]  [facility] [address] [place], [place], [address] Phone: [phone]   Fax:  [phone]

## 2022-09-29 ENCOUNTER — Ambulatory Visit (HOSPITAL_BASED_OUTPATIENT_CLINIC_OR_DEPARTMENT_OTHER): Payer: BC Managed Care – PPO | Admitting: Physical Therapy

## 2022-10-03 ENCOUNTER — Encounter (HOSPITAL_BASED_OUTPATIENT_CLINIC_OR_DEPARTMENT_OTHER): Payer: Self-pay | Admitting: Physical Therapy

## 2022-10-03 ENCOUNTER — Ambulatory Visit (HOSPITAL_BASED_OUTPATIENT_CLINIC_OR_DEPARTMENT_OTHER): Payer: BC Managed Care – PPO | Attending: Physical Medicine & Rehabilitation | Admitting: Physical Therapy

## 2022-10-03 DIAGNOSIS — M542 Cervicalgia: Secondary | ICD-10-CM | POA: Diagnosis not present

## 2022-10-03 DIAGNOSIS — M6281 Muscle weakness (generalized): Secondary | ICD-10-CM | POA: Insufficient documentation

## 2022-10-03 DIAGNOSIS — R252 Cramp and spasm: Secondary | ICD-10-CM | POA: Diagnosis not present

## 2022-10-03 NOTE — Therapy (Signed)
OUTPATIENT PHYSICAL THERAPY TREATMENT   Patient Name: Henry Jennings MRN: [id number] DOB:[birth year], 52 y.o., male Today's Date: 10/03/2022  END OF SESSION:  PT End of Session - 10/03/22 1500     Visit Number 4    Number of Visits 13    Date for PT Re-Evaluation 10/28/22    PT Start Time 1454   pt arrived late   PT Stop Time 1530    PT Time Calculation (min) 36 min             Past Medical History:  Diagnosis Date   Depression    Herniated disc, cervical    Past Surgical History:  Procedure Laterality Date   ANTERIOR CERVICAL DECOMP/DISCECTOMY FUSION N/A 08/02/2021   Procedure: C4-5, C5-6, C6-7, C7-T1 ANTERIOR CERVICAL DISCECTOMY AND FUSION WITH PLATES AND SCREWS;  Surgeon: Kerrin Champagne, MD;  Location: [facility] OR;  Service: Orthopedics;  Laterality: N/A;   colonscopy  2021   WISDOM TOOTH EXTRACTION Left 2020   Left lower   Patient Active Problem List   Diagnosis Date Noted   Spinal stenosis of cervical region    Cervical disc herniation    Other spondylosis with myelopathy, cervical region    S/P cervical spinal fusion 08/02/2021   Anxiety 06/06/2021   Bipolar disorder (HCC) 06/06/2021   Chronic fatigue syndrome 06/06/2021   Fibromyalgia 06/06/2021   Constipation 06/06/2021   Insomnia 06/06/2021   Panic disorder 06/06/2021   Gait abnormality 06/06/2021   Neck pain 06/06/2021    REFERRING PROVIDER: Erick Colace, MD   REFERRING DIAG: M79.7 (ICD-10-CM) - Fibromyalgia   Rationale for Evaluation and Treatment: Rehabilitation  THERAPY DIAG:  Cervicalgia  Muscle weakness (generalized)  Cramp and spasm  ONSET DATE: 2018   SUBJECTIVE:                                                                                                                                                                                           SUBJECTIVE STATEMENT: Pt reports being more tired after last aquatic session than he expected. Some increase in shoulder discomfort "but  I think I was tense the whole time I was here."  FROM EVAL: Increasing issues since 2018. Went to PT and it seemed to help. Seemed ot get worse over the pandemic. Went to rheumatology due to other symptoms happening. No dx came from those visits. In 2022 was dx with fibromyalgia. ACDF Aug 2023. It took a lot longer to heal from that than I expected. From 2021 and on I was dropping things and things were too heavy but a lot of it has gone away.  After too much lifting or 10+lb and arms will give. Most nerves are fine but there was one on both side that were limited in NCV testing.  Skin hypersensitivity that can be irritated by certain clothes.    PERTINENT HISTORY:  H/o migraines-sweatband and rams horn pattern  PAIN:  Are you having pain? Yes: NPRS scale: 4.5/10 Pain location: generalized Pain description: achy, sharp with certain motions Aggravating factors: never know Relieving factors: hot shower, CBD, rotate hot and cold, massage  PRECAUTIONS:  None  RED FLAGS: None   WEIGHT BEARING RESTRICTIONS:  No  FALLS:  Has patient fallen in last 6 months?  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    OBJECTIVE:   DIAGNOSTIC FINDINGS:  X-ray of the cervical spine taken on 08/17/2022 was independently reviewed  and interpreted, showing C4-7 ACDF.  Allografts are in appropriate position. Anterior plate and screws are in good position. There is no lucency around the screws or the interbody cages. No fracture or dislocation seen.  From MD note: His EMG does not show any signs of neuropathy or ulnar neuropathy. He does have decreased recruitment in tricep muscles bilaterally but no significant tricep weakness.  PATIENT SURVEYS:  UEFS eval: 46  COGNITIVE STATUS: Within functional limits  for tasks assessed    POSTURE:  increased lumbar lordosis  HAND DOMINANCE:  Right   Body Part #1 Cervical  PALPATION: Eval: more spasm noted in Lt upper traps  UE MMT UPPER EXTREMITY MMT:  MMT Right eval Left eval  Shoulder flexion 12.8 12.4  Shoulder extension    Shoulder abduction    Shoulder adduction    Shoulder extension    Shoulder internal rotation    Shoulder external rotation 11.1 7.7  Middle trapezius    Lower trapezius    Elbow flexion    Elbow extension     (Blank rows = not tested)       Body Part #2 Hip    LE MMT LOWER EXTREMITY MMT:    MMT Right eval Left eval  Hip flexion 19.5 19.8  Hip extension    Hip abduction 23.2 26.5  Hip adduction    Hip internal rotation    Hip external rotation    Knee flexion    Knee extension 26.4 31.3  Ankle dorsiflexion    Ankle plantarflexion    Ankle inversion    Ankle eversion     (Blank rows = not tested)     FUNCTIONAL TESTS:  EVAL: 30 seconds chair stand test 5;    places right foot under chair to press up, does not use UEs   TREATMENT:  Pt seen for aquatic therapy today.  Treatment took place in water 3.5-4.75 ft in depth at the [facility] pool. Temp of water was 91.  Pt entered/exited the pool via stairs independently with bilat rail.  * unsupported walking forward / backward with reciprocal arm swing  x 4 laps * side stepping with arm addct/ abdct x 2 laps * wall push up/off x 10 * TrA set with short hollow noodle pull downs (slow and controlled)  * marching with gentle row motion, hands on short  hollow noodle.   *with rainbow hand floats:  staggered stance with horiz abdct/ addct 2x5;  tricep press down x 10 * return to walking backwards/ forwards;  *cycling on  noodle ue support HB x 6 widths * hands laced behind back for bicep stretch, then gentle  lateral cervical flexion * bilat shoulder ER/IR x 8; bilat shoulder flex/ext  in gentle staggered stance * return to walking backwards/ forwards    Pt requires the buoyancy and hydrostatic pressure of water for support, and to offload joints by unweighting joint load by at least 50 % in navel deep water and by at least 75-80% in chest to neck deep water.  Viscosity of the water is needed for resistance of strengthening. Water current perturbations provides challenge to standing balance requiring increased core activation.      PATIENT EDUCATION:  Education details:exercise form/rationale Person educated: Patient Education method: Explanation, Demonstration, Tactile cues, and Verbal cues Education comprehension: verbalized understanding, returned demonstration, verbal cues required, tactile cues required, and needs further education  HOME EXERCISE PROGRAM: Walking program  Research counselor/psych with chronic pain/fibromyalgia specialty   ASSESSMENT:  CLINICAL IMPRESSION: Pt observed moving in less guarded manner, however at a thoughtful, slow pace. He reports greatest reduction of pain when in water depth of 63ft 8in.   Reports slight reduction by end of session. He remains a good candidate for aquatic therapy intervention and will benefit using the properties of water to progress towards land based goals.  Goals are ongoing.      FROM EVAL: Patient is a 52 y.o. M who was seen today for physical therapy evaluation and treatment for cervicalgia 1 year s/p ACDF and diffuse pain due to fibromyalgia. Pt moves slowly in sit>stand with right foot placed behind for push which is consistent with his complaints of recent onset of left lateral knee and ankle pain. Since the onset of COVID pandemic, pt has had a significant decrease in daily activities, going from working 2 full-time jobs to difficulty with a single part-time due to fluctuations in fibromyalgia. We did discuss the neuropsychology  component of hypersensitivity and diffuse pain and pt agreed to research a counselor or psychologist with this specialty.  I believe the pt will do very well in the water to return to musculoskeletal strengthening and generalized movement. Communication will be held between pt and PT regarding pain during activity as well as in the days following.     REHAB POTENTIAL: Fair diagnosis  CLINICAL DECISION MAKING: Evolving/moderate complexity  EVALUATION COMPLEXITY: Low   GOALS: Goals reviewed with patient? Yes  SHORT TERM GOALS: Target date: 9/14  Pt will be performing walking program on a daily basis with understanding of rest breaks/modifications necessary Baseline: Goal status: Met 09/27/22    LONG TERM GOALS: Target date: POC date  UEFI to improve by MDC Baseline:  Goal status: INITIAL  2.  Each MMT via hand held dynamometry to increase by 10+lb Baseline:  Goal status:  INITIAL  3.  Able to lift objects <10lb throughout his day without dropping them or arms "giving out" Baseline:  Goal status: INITIAL  4.  Pt will verbalize increased tolerance to work activities and postures necessary Baseline:  Goal status: INITIAL     PLAN:  PT FREQUENCY: 1-2x/week  PT DURATION: other: through POC date  PLANNED INTERVENTIONS: Therapeutic exercises, Therapeutic activity, Neuromuscular re-education, Balance training, Gait training, Patient/Family education, Self Care, Joint mobilization, Stair training, Aquatic Therapy, Dry Needling, Spinal mobilization, Cryotherapy, Moist heat, Taping, Ionotophoresis 4mg /ml Dexamethasone, Manual therapy, and Re-evaluation.  PLAN FOR NEXT SESSION: continue aquatics, how is walking program going?  Mayer Camel, PTA 10/03/22 4:17 PM [facility] [address] [place], [place], [address] Phone: [phone]   Fax:  [phone]

## 2022-10-05 ENCOUNTER — Ambulatory Visit (HOSPITAL_BASED_OUTPATIENT_CLINIC_OR_DEPARTMENT_OTHER): Payer: BC Managed Care – PPO | Admitting: Physical Therapy

## 2022-10-09 DIAGNOSIS — F332 Major depressive disorder, recurrent severe without psychotic features: Secondary | ICD-10-CM | POA: Diagnosis not present

## 2022-10-09 DIAGNOSIS — F4312 Post-traumatic stress disorder, chronic: Secondary | ICD-10-CM | POA: Diagnosis not present

## 2022-10-09 DIAGNOSIS — F902 Attention-deficit hyperactivity disorder, combined type: Secondary | ICD-10-CM | POA: Diagnosis not present

## 2022-10-10 ENCOUNTER — Encounter (HOSPITAL_BASED_OUTPATIENT_CLINIC_OR_DEPARTMENT_OTHER): Payer: Self-pay | Admitting: Physical Therapy

## 2022-10-10 ENCOUNTER — Ambulatory Visit (HOSPITAL_BASED_OUTPATIENT_CLINIC_OR_DEPARTMENT_OTHER): Payer: BC Managed Care – PPO | Admitting: Physical Therapy

## 2022-10-10 DIAGNOSIS — R252 Cramp and spasm: Secondary | ICD-10-CM

## 2022-10-10 DIAGNOSIS — M542 Cervicalgia: Secondary | ICD-10-CM

## 2022-10-10 DIAGNOSIS — M6281 Muscle weakness (generalized): Secondary | ICD-10-CM | POA: Diagnosis not present

## 2022-10-10 NOTE — Therapy (Signed)
OUTPATIENT PHYSICAL THERAPY TREATMENT   Patient Name: Henry Jennings MRN: [id number] DOB:December 07, 1970, 52 y.o., male Today's Date: 10/10/2022  END OF SESSION:  PT End of Session - 10/10/22 1458     Visit Number 5    Number of Visits 13    Date for PT Re-Evaluation 10/28/22    PT Start Time 1452   pt late   PT Stop Time 1530    PT Time Calculation (min) 38 min    Activity Tolerance Patient tolerated treatment well    Behavior During Therapy [facility] for tasks assessed/performed              Past Medical History:  Diagnosis Date   Depression    Herniated disc, cervical    Past Surgical History:  Procedure Laterality Date   ANTERIOR CERVICAL DECOMP/DISCECTOMY FUSION N/A 08/02/2021   Procedure: C4-5, C5-6, C6-7, C7-T1 ANTERIOR CERVICAL DISCECTOMY AND FUSION WITH PLATES AND SCREWS;  Surgeon: Kerrin Champagne, MD;  Location: [facility] OR;  Service: Orthopedics;  Laterality: N/A;   colonscopy  2021   WISDOM TOOTH EXTRACTION Left 2020   Left lower   Patient Active Problem List   Diagnosis Date Noted   Spinal stenosis of cervical region    Cervical disc herniation    Other spondylosis with myelopathy, cervical region    S/P cervical spinal fusion 08/02/2021   Anxiety 06/06/2021   Bipolar disorder (HCC) 06/06/2021   Chronic fatigue syndrome 06/06/2021   Fibromyalgia 06/06/2021   Constipation 06/06/2021   Insomnia 06/06/2021   Panic disorder 06/06/2021   Gait abnormality 06/06/2021   Neck pain 06/06/2021    REFERRING PROVIDER: Erick Colace, MD   REFERRING DIAG: M79.7 (ICD-10-CM) - Fibromyalgia   Rationale for Evaluation and Treatment: Rehabilitation  THERAPY DIAG:  Cervicalgia  Muscle weakness (generalized)  Cramp and spasm  ONSET DATE: 2018   SUBJECTIVE:                                                                                                                                                                                           SUBJECTIVE STATEMENT: Pt  reports increasing his amb distance which he has been keeping up with daily.   FROM EVAL: Increasing issues since 2018. Went to PT and it seemed to help. Seemed ot get worse over the pandemic. Went to rheumatology due to other symptoms happening. No dx came from those visits. In 2022 was dx with fibromyalgia. ACDF Aug 2023. It took a lot longer to heal from that than I expected. From 2021 and on I was dropping things and things were too heavy but a lot of  it has gone away.  After too much lifting or 10+lb and arms will give. Most nerves are fine but there was one on both side that were limited in NCV testing.  Skin hypersensitivity that can be irritated by certain clothes.    PERTINENT HISTORY:  H/o migraines-sweatband and rams horn pattern  PAIN:  Are you having pain? Yes: NPRS scale: 4.5/10 Pain location: generalized Pain description: achy, sharp with certain motions Aggravating factors: never know Relieving factors: hot shower, CBD, rotate hot and cold, massage  PRECAUTIONS:  None  RED FLAGS: None   WEIGHT BEARING RESTRICTIONS:  No  FALLS:  Has patient fallen in last 6 months?  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    OBJECTIVE:   DIAGNOSTIC FINDINGS:  X-ray of the cervical spine taken on 08/17/2022 was independently reviewed  and interpreted, showing C4-7 ACDF.  Allografts are in appropriate position. Anterior plate and screws are in good position. There is no lucency around the screws or the interbody cages. No fracture or dislocation seen.  From MD note: His EMG does not show any signs of neuropathy or ulnar neuropathy. He does have decreased recruitment in tricep muscles bilaterally but no significant tricep weakness.  PATIENT SURVEYS:  UEFS eval: 46  COGNITIVE  STATUS: Within functional limits for tasks assessed    POSTURE:  increased lumbar lordosis  HAND DOMINANCE:  Right   Body Part #1 Cervical  PALPATION: Eval: more spasm noted in Lt upper traps  UE MMT UPPER EXTREMITY MMT:  MMT Right eval Left eval  Shoulder flexion 12.8 12.4  Shoulder extension    Shoulder abduction    Shoulder adduction    Shoulder extension    Shoulder internal rotation    Shoulder external rotation 11.1 7.7  Middle trapezius    Lower trapezius    Elbow flexion    Elbow extension     (Blank rows = not tested)       Body Part #2 Hip    LE MMT LOWER EXTREMITY MMT:    MMT Right eval Left eval  Hip flexion 19.5 19.8  Hip extension    Hip abduction 23.2 26.5  Hip adduction    Hip internal rotation    Hip external rotation    Knee flexion    Knee extension 26.4 31.3  Ankle dorsiflexion    Ankle plantarflexion    Ankle inversion    Ankle eversion     (Blank rows = not tested)     FUNCTIONAL TESTS:  EVAL: 30 seconds chair stand test 5;    places right foot under chair to press up, does not use UEs   TREATMENT:  Pt seen for aquatic therapy today.  Treatment took place in water 3.5-4.75 ft in depth at the [facility] pool. Temp of water was 91.  Pt entered/exited the pool via stairs independently with bilat rail.  * unsupported walking forward / backward with reciprocal arm swing  x 4 laps *Side stepping with shoulder add/abd x 2 width *Horizontal shoulder add/abd staggered stance *Bow&arrow  * wall push up/off x 10 * TrA set with full hollow noodle pull downs (slow and controlled)  * marching with gentle row motion, hands on short  hollow noodle.   * return to walking backwards/ forwards;  *cycling on  noodle ue support HB x 6 widths ue breast stroke. * return to walking backwards/ forwards     Pt requires the buoyancy and hydrostatic pressure of water for support, and to offload joints by unweighting joint load by at least 50 % in navel deep water and by at least 75-80% in chest to neck deep water.  Viscosity of the water is needed for resistance of strengthening. Water current perturbations provides challenge to standing balance requiring increased core activation.      PATIENT EDUCATION:  Education details:exercise form/rationale Person educated: Patient Education method: Explanation, Demonstration, Actor cues, and Verbal cues Education comprehension: verbalized understanding, returned demonstration, verbal cues required, tactile cues required, and needs further education  HOME EXERCISE PROGRAM: Walking program  Research counselor/psych with chronic pain/fibromyalgia specialty   ASSESSMENT:  CLINICAL IMPRESSION:  Pt reports he has improved amb distance walking 1 mile daily. Discussed amount of discomfort associated the next day. Pt encouraged to try decreasing distance by 1/4 mile to decrease discomfort that lasts up towards 14-18 hours afterwards. He tolerates session without any increase in pain. No guarding noticed. With good fluid movement throughout.  He is balancing well seated on noodle able to engage UE in position.  Reports shoulder have been more painful recently. Plan to consider land based intervention at re-cert for further cervical and shoulder focus.       FROM EVAL: Patient is a 52 y.o. M who was seen today for physical therapy evaluation and treatment for cervicalgia 1 year s/p ACDF and diffuse pain due to fibromyalgia. Pt moves slowly in sit>stand with right foot placed behind for push which is consistent with his complaints of recent onset of left lateral knee and ankle pain. Since the onset of COVID pandemic, pt has had a significant decrease in daily activities, going from working 2 full-time jobs to difficulty with a single part-time due to  fluctuations in fibromyalgia. We did discuss the neuropsychology component of hypersensitivity and diffuse pain and pt agreed to research a counselor or psychologist with this specialty.  I believe the pt will do very well in the water to return to musculoskeletal strengthening and generalized movement. Communication will be held between pt and PT regarding pain during activity as well as in the days following.     REHAB POTENTIAL: Fair diagnosis  CLINICAL DECISION MAKING: Evolving/moderate complexity  EVALUATION COMPLEXITY: Low   GOALS: Goals reviewed with patient? Yes  SHORT TERM GOALS: Target date: 9/14  Pt will be performing walking program on a daily basis with understanding of rest breaks/modifications necessary Baseline: Goal status: Met 09/27/22    LONG TERM GOALS: Target date: POC date  UEFI to improve by MDC Baseline:  Goal status: INITIAL  2.  Each MMT via hand held dynamometry to increase by 10+lb Baseline:  Goal status: INITIAL  3.  Able to lift  objects <10lb throughout his day without dropping them or arms "giving out" Baseline:  Goal status: INITIAL  4.  Pt will verbalize increased tolerance to work activities and postures necessary Baseline:  Goal status: INITIAL     PLAN:  PT FREQUENCY: 1-2x/week  PT DURATION: other: through POC date  PLANNED INTERVENTIONS: Therapeutic exercises, Therapeutic activity, Neuromuscular re-education, Balance training, Gait training, Patient/Family education, Self Care, Joint mobilization, Stair training, Aquatic Therapy, Dry Needling, Spinal mobilization, Cryotherapy, Moist heat, Taping, Ionotophoresis 4mg /ml Dexamethasone, Manual therapy, and Re-evaluation.  PLAN FOR NEXT SESSION: continue aquatics, how is walking program going?  [address] [place]) Emmeline Winebarger MPT 10/10/22 3:01 PM [facility] [address] [place], [place], [address] Phone: [phone]   Fax:   [phone]

## 2022-10-12 ENCOUNTER — Ambulatory Visit (HOSPITAL_BASED_OUTPATIENT_CLINIC_OR_DEPARTMENT_OTHER): Payer: BC Managed Care – PPO | Admitting: Physical Therapy

## 2022-10-13 ENCOUNTER — Encounter: Payer: BC Managed Care – PPO | Admitting: Physical Medicine & Rehabilitation

## 2022-10-16 DIAGNOSIS — F902 Attention-deficit hyperactivity disorder, combined type: Secondary | ICD-10-CM | POA: Diagnosis not present

## 2022-10-16 DIAGNOSIS — F411 Generalized anxiety disorder: Secondary | ICD-10-CM | POA: Diagnosis not present

## 2022-10-16 DIAGNOSIS — F331 Major depressive disorder, recurrent, moderate: Secondary | ICD-10-CM | POA: Diagnosis not present

## 2022-10-17 ENCOUNTER — Encounter (HOSPITAL_BASED_OUTPATIENT_CLINIC_OR_DEPARTMENT_OTHER): Payer: Self-pay | Admitting: Physical Therapy

## 2022-10-17 ENCOUNTER — Ambulatory Visit (HOSPITAL_BASED_OUTPATIENT_CLINIC_OR_DEPARTMENT_OTHER): Payer: BC Managed Care – PPO | Admitting: Physical Therapy

## 2022-10-17 DIAGNOSIS — M542 Cervicalgia: Secondary | ICD-10-CM | POA: Diagnosis not present

## 2022-10-17 DIAGNOSIS — M6281 Muscle weakness (generalized): Secondary | ICD-10-CM | POA: Diagnosis not present

## 2022-10-17 DIAGNOSIS — R252 Cramp and spasm: Secondary | ICD-10-CM | POA: Diagnosis not present

## 2022-10-17 NOTE — Therapy (Signed)
OUTPATIENT PHYSICAL THERAPY TREATMENT   Patient Name: Henry Jennings MRN: [id number] DOB:Oct 01, 1970, 52 y.o., male Today's Date: 10/17/2022  END OF SESSION:  PT End of Session - 10/17/22 1442     Visit Number 6    Number of Visits 13    Date for PT Re-Evaluation 10/28/22    PT Start Time 1445    PT Stop Time 1530    PT Time Calculation (min) 45 min    Activity Tolerance Patient tolerated treatment well    Behavior During Therapy [facility] for tasks assessed/performed              Past Medical History:  Diagnosis Date   Depression    Herniated disc, cervical    Past Surgical History:  Procedure Laterality Date   ANTERIOR CERVICAL DECOMP/DISCECTOMY FUSION N/A 08/02/2021   Procedure: C4-5, C5-6, C6-7, C7-T1 ANTERIOR CERVICAL DISCECTOMY AND FUSION WITH PLATES AND SCREWS;  Surgeon: Kerrin Champagne, MD;  Location: [facility] OR;  Service: Orthopedics;  Laterality: N/A;   colonscopy  2021   WISDOM TOOTH EXTRACTION Left 2020   Left lower   Patient Active Problem List   Diagnosis Date Noted   Spinal stenosis of cervical region    Cervical disc herniation    Other spondylosis with myelopathy, cervical region    S/P cervical spinal fusion 08/02/2021   Anxiety 06/06/2021   Bipolar disorder (HCC) 06/06/2021   Chronic fatigue syndrome 06/06/2021   Fibromyalgia 06/06/2021   Constipation 06/06/2021   Insomnia 06/06/2021   Panic disorder 06/06/2021   Gait abnormality 06/06/2021   Neck pain 06/06/2021    REFERRING PROVIDER: Erick Colace, MD   REFERRING DIAG: M79.7 (ICD-10-CM) - Fibromyalgia   Rationale for Evaluation and Treatment: Rehabilitation  THERAPY DIAG:  Cervicalgia  Muscle weakness (generalized)  Cramp and spasm  ONSET DATE: 2018   SUBJECTIVE:                                                                                                                                                                                           SUBJECTIVE STATEMENT: Pain up a  little  FROM EVAL: Increasing issues since 2018. Went to PT and it seemed to help. Seemed ot get worse over the pandemic. Went to rheumatology due to other symptoms happening. No dx came from those visits. In 2022 was dx with fibromyalgia. ACDF Aug 2023. It took a lot longer to heal from that than I expected. From 2021 and on I was dropping things and things were too heavy but a lot of it has gone away.  After too much lifting or 10+lb and arms will  give. Most nerves are fine but there was one on both side that were limited in NCV testing.  Skin hypersensitivity that can be irritated by certain clothes.    PERTINENT HISTORY:  H/o migraines-sweatband and rams horn pattern  PAIN:  Are you having pain? Yes: NPRS scale: 7/10 Pain location: generalized Pain description: achy, sharp with certain motions Aggravating factors: never know Relieving factors: hot shower, CBD, rotate hot and cold, massage  PRECAUTIONS:  None  RED FLAGS: None   WEIGHT BEARING RESTRICTIONS:  No  FALLS:  Has patient fallen in last 6 months?  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    OBJECTIVE:   DIAGNOSTIC FINDINGS:  X-ray of the cervical spine taken on 08/17/2022 was independently reviewed  and interpreted, showing C4-7 ACDF.  Allografts are in appropriate position. Anterior plate and screws are in good position. There is no lucency around the screws or the interbody cages. No fracture or dislocation seen.  From MD note: His EMG does not show any signs of neuropathy or ulnar neuropathy. He does have decreased recruitment in tricep muscles bilaterally but no significant tricep weakness.  PATIENT SURVEYS:  UEFS eval: 46  COGNITIVE STATUS: Within functional limits for tasks assessed    POSTURE:  increased  lumbar lordosis  HAND DOMINANCE:  Right   Body Part #1 Cervical  PALPATION: Eval: more spasm noted in Lt upper traps  UE MMT UPPER EXTREMITY MMT:  MMT Right eval Left eval  Shoulder flexion 12.8 12.4  Shoulder extension    Shoulder abduction    Shoulder adduction    Shoulder extension    Shoulder internal rotation    Shoulder external rotation 11.1 7.7  Middle trapezius    Lower trapezius    Elbow flexion    Elbow extension     (Blank rows = not tested)       Body Part #2 Hip    LE MMT LOWER EXTREMITY MMT:    MMT Right eval Left eval  Hip flexion 19.5 19.8  Hip extension    Hip abduction 23.2 26.5  Hip adduction    Hip internal rotation    Hip external rotation    Knee flexion    Knee extension 26.4 31.3  Ankle dorsiflexion    Ankle plantarflexion    Ankle inversion    Ankle eversion     (Blank rows = not tested)     FUNCTIONAL TESTS:  EVAL: 30 seconds chair stand test 5;    places right foot under chair to press up, does not use UEs   TREATMENT:  Pt seen for aquatic therapy today.  Treatment took place in water 3.5-4.75 ft in depth at the [facility] pool. Temp of water was 91.  Pt entered/exited the pool via stairs independently with bilat rail.  * unsupported walking forward / backward with reciprocal arm swing  x 4 laps *Side stepping with shoulder add/abd x 2 width *Horizontal shoulder add/abd wide stance *Bow&arrow  * marching with gentle row motion, hands on yellow hand buoys forward and back * wall push up/off x 10 *Solid noodle stomp hip in neutral then ext rot x10 slow then x 10 fast R/L ue support HB *hip hinging.  Verbal and tactile cues for execution 2 x 5 *HB carry bilat ue forward and backward x 2 widths ea; unilateral forward and back x 1 width R/L.  VC for core engagement *cycling on  noodle ue  support HB x 4 widths ue breast stroke: hip add/bad x 10 (difficulty with balance) *sitting balance on yellow noodle (difficult, decrease to solid noodle next session) * return to walking backwards/ forwards between exercises   Pt requires the buoyancy and hydrostatic pressure of water for support, and to offload joints by unweighting joint load by at least 50 % in navel deep water and by at least 75-80% in chest to neck deep water.  Viscosity of the water is needed for resistance of strengthening. Water current perturbations provides challenge to standing balance requiring increased core activation.      PATIENT EDUCATION:  Education details:exercise form/rationale Person educated: Patient Education method: Explanation, Demonstration, Tactile cues, and Verbal cues Education comprehension: verbalized understanding, returned demonstration, verbal cues required, tactile cues required, and needs further education  HOME EXERCISE PROGRAM: Walking program  Research counselor/psych with chronic pain/fibromyalgia specialty   ASSESSMENT:  CLINICAL IMPRESSION:  Discussed plan to recert at end of cert adding land based intervention for more focused attention to cervicalgia and shoulder discomfort. Pt in agreement.  He reports good response to last session.  Has had a small increase in general pain maybe secondary to weather changes.  He does have an immediate reduction in pain with submersion. Tolerates addition of LB engagement with hip hinging and balance with core engagement without pain.  He is very much challenged with sitting on noodle unable to hold position without sculling.  Goals ongoing         FROM EVAL: Patient is a 52 y.o. M who was seen today for physical therapy evaluation and treatment for cervicalgia 1 year s/p ACDF and diffuse pain due to fibromyalgia. Pt moves slowly in sit>stand with right foot placed behind for push which is consistent with his complaints of recent onset  of left lateral knee and ankle pain. Since the onset of COVID pandemic, pt has had a significant decrease in daily activities, going from working 2 full-time jobs to difficulty with a single part-time due to fluctuations in fibromyalgia. We did discuss the neuropsychology component of hypersensitivity and diffuse pain and pt agreed to research a counselor or psychologist with this specialty.  I believe the pt will do very well in the water to return to musculoskeletal strengthening and generalized movement. Communication will be held between pt and PT regarding pain during activity as well as in the days following.     REHAB POTENTIAL: Fair diagnosis  CLINICAL DECISION MAKING: Evolving/moderate complexity  EVALUATION COMPLEXITY: Low   GOALS: Goals reviewed with patient? Yes  SHORT TERM GOALS: Target date: 9/14  Pt will be performing walking program on a  daily basis with understanding of rest breaks/modifications necessary Baseline: Goal status: Met 09/27/22    LONG TERM GOALS: Target date: POC date  UEFI to improve by MDC Baseline:  Goal status: INITIAL  2.  Each MMT via hand held dynamometry to increase by 10+lb Baseline:  Goal status: INITIAL  3.  Able to lift objects <10lb throughout his day without dropping them or arms "giving out" Baseline:  Goal status: INITIAL  4.  Pt will verbalize increased tolerance to work activities and postures necessary Baseline:  Goal status: INITIAL     PLAN:  PT FREQUENCY: 1-2x/week  PT DURATION: other: through POC date  PLANNED INTERVENTIONS: Therapeutic exercises, Therapeutic activity, Neuromuscular re-education, Balance training, Gait training, Patient/Family education, Self Care, Joint mobilization, Stair training, Aquatic Therapy, Dry Needling, Spinal mobilization, Cryotherapy, Moist heat, Taping, Ionotophoresis 4mg /ml Dexamethasone, Manual therapy, and Re-evaluation.  PLAN FOR NEXT SESSION: continue aquatics, how is walking  program going?  [address] [place]) Chace Bisch MPT 10/17/22 2:54 PM [facility] [address] [place], [place], [address] Phone: [phone]   Fax:  [phone]

## 2022-10-19 ENCOUNTER — Ambulatory Visit (HOSPITAL_BASED_OUTPATIENT_CLINIC_OR_DEPARTMENT_OTHER): Payer: BC Managed Care – PPO | Admitting: Physical Therapy

## 2022-10-19 ENCOUNTER — Encounter (HOSPITAL_BASED_OUTPATIENT_CLINIC_OR_DEPARTMENT_OTHER): Payer: Self-pay | Admitting: Physical Therapy

## 2022-10-19 DIAGNOSIS — M542 Cervicalgia: Secondary | ICD-10-CM

## 2022-10-19 DIAGNOSIS — R252 Cramp and spasm: Secondary | ICD-10-CM | POA: Diagnosis not present

## 2022-10-19 DIAGNOSIS — M6281 Muscle weakness (generalized): Secondary | ICD-10-CM

## 2022-10-19 NOTE — Therapy (Signed)
OUTPATIENT PHYSICAL THERAPY TREATMENT   Patient Name: Henry Jennings MRN: [id number] DOB:Oct 31, 1970, 52 y.o., male Today's Date: 10/19/2022  END OF SESSION:  PT End of Session - 10/19/22 1536     Visit Number 7    Number of Visits 13    Date for PT Re-Evaluation 10/28/22    PT Start Time 1453    PT Stop Time 1532    PT Time Calculation (min) 39 min    Activity Tolerance Patient tolerated treatment well    Behavior During Therapy [facility] for tasks assessed/performed               Past Medical History:  Diagnosis Date   Depression    Herniated disc, cervical    Past Surgical History:  Procedure Laterality Date   ANTERIOR CERVICAL DECOMP/DISCECTOMY FUSION N/A 08/02/2021   Procedure: C4-5, C5-6, C6-7, C7-T1 ANTERIOR CERVICAL DISCECTOMY AND FUSION WITH PLATES AND SCREWS;  Surgeon: Kerrin Champagne, MD;  Location: [facility] OR;  Service: Orthopedics;  Laterality: N/A;   colonscopy  2021   WISDOM TOOTH EXTRACTION Left 2020   Left lower   Patient Active Problem List   Diagnosis Date Noted   Spinal stenosis of cervical region    Cervical disc herniation    Other spondylosis with myelopathy, cervical region    S/P cervical spinal fusion 08/02/2021   Anxiety 06/06/2021   Bipolar disorder (HCC) 06/06/2021   Chronic fatigue syndrome 06/06/2021   Fibromyalgia 06/06/2021   Constipation 06/06/2021   Insomnia 06/06/2021   Panic disorder 06/06/2021   Gait abnormality 06/06/2021   Neck pain 06/06/2021    REFERRING PROVIDER: Erick Colace, MD   REFERRING DIAG: M79.7 (ICD-10-CM) - Fibromyalgia   Rationale for Evaluation and Treatment: Rehabilitation  THERAPY DIAG:  Cervicalgia  Muscle weakness (generalized)  Cramp and spasm  ONSET DATE: 2018   SUBJECTIVE:                                                                                                                                                                                           SUBJECTIVE STATEMENT: "Pain was  better after the last session.  I think its because I was relaxed and I didn't rush."  FROM EVAL: Increasing issues since 2018. Went to PT and it seemed to help. Seemed ot get worse over the pandemic. Went to rheumatology due to other symptoms happening. No dx came from those visits. In 2022 was dx with fibromyalgia. ACDF Aug 2023. It took a lot longer to heal from that than I expected. From 2021 and on I was dropping things and things were too heavy but a  lot of it has gone away.  After too much lifting or 10+lb and arms will give. Most nerves are fine but there was one on both side that were limited in NCV testing.  Skin hypersensitivity that can be irritated by certain clothes.    PERTINENT HISTORY:  H/o migraines-sweatband and rams horn pattern  PAIN:  Are you having pain? Yes: NPRS scale: 7/10 Pain location: generalized Pain description: achy, sharp with certain motions Aggravating factors: never know Relieving factors: hot shower, CBD, rotate hot and cold, massage  PRECAUTIONS:  None  RED FLAGS: None   WEIGHT BEARING RESTRICTIONS:  No  FALLS:  Has patient fallen in last 6 months?  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    OBJECTIVE:   DIAGNOSTIC FINDINGS:  X-ray of the cervical spine taken on 08/17/2022 was independently reviewed  and interpreted, showing C4-7 ACDF.  Allografts are in appropriate position. Anterior plate and screws are in good position. There is no lucency around the screws or the interbody cages. No fracture or dislocation seen.  From MD note: His EMG does not show any signs of neuropathy or ulnar neuropathy. He does have decreased recruitment in tricep muscles bilaterally but no significant tricep weakness.  PATIENT SURVEYS:  UEFS eval: 46  COGNITIVE  STATUS: Within functional limits for tasks assessed    POSTURE:  increased lumbar lordosis  HAND DOMINANCE:  Right   Body Part #1 Cervical  PALPATION: Eval: more spasm noted in Lt upper traps  UE MMT UPPER EXTREMITY MMT:  MMT Right eval Left eval  Shoulder flexion 12.8 12.4  Shoulder extension    Shoulder abduction    Shoulder adduction    Shoulder extension    Shoulder internal rotation    Shoulder external rotation 11.1 7.7  Middle trapezius    Lower trapezius    Elbow flexion    Elbow extension     (Blank rows = not tested)       Body Part #2 Hip    LE MMT LOWER EXTREMITY MMT:    MMT Right eval Left eval  Hip flexion 19.5 19.8  Hip extension    Hip abduction 23.2 26.5  Hip adduction    Hip internal rotation    Hip external rotation    Knee flexion    Knee extension 26.4 31.3  Ankle dorsiflexion    Ankle plantarflexion    Ankle inversion    Ankle eversion     (Blank rows = not tested)     FUNCTIONAL TESTS:  EVAL: 30 seconds chair stand test 5;    places right foot under chair to press up, does not use UEs   TREATMENT:  Pt seen for aquatic therapy today.  Treatment took place in water 3.5-4.75 ft in depth at the [facility] pool. Temp of water was 91.  Pt entered/exited the pool via stairs independently with bilat rail.  * unsupported walking forward / backward with reciprocal arm swing  x 4 laps *Side stepping with shoulder add/abd x 2 width *HB carry for core engagement x 2 widths forward, back and side stepping *Horizontal shoulder add/abd wide stance *Bow&arrow  * wall push up/off x 10 *Hip hiking bottom step 2 x 10 R/L *hip hinging.  2x10 *Solid noodle stomp hip in neutral then ext rot x10 slow then x 10 fast R/L ue support HB *cycling on  noodle ue support HB x 4 widths ue breast stroke: hip add/bad  x 10 (difficulty with balance)   Pt requires the buoyancy and hydrostatic pressure of water for support, and to offload joints by unweighting joint load by at least 50 % in navel deep water and by at least 75-80% in chest to neck deep water.  Viscosity of the water is needed for resistance of strengthening. Water current perturbations provides challenge to standing balance requiring increased core activation.      PATIENT EDUCATION:  Education details:exercise form/rationale Person educated: Patient Education method: Explanation, Demonstration, Tactile cues, and Verbal cues Education comprehension: verbalized understanding, returned demonstration, verbal cues required, tactile cues required, and needs further education  HOME EXERCISE PROGRAM: Walking program  Research counselor/psych with chronic pain/fibromyalgia specialty   ASSESSMENT:  CLINICAL IMPRESSION:  Improved SLS and sitting balance on noodle no LOB nor difficulty maintaining position.  Improved control with hip hinging and toleration to increased reps.  Good response to last session with decreased pain.  Goals ongoing   FROM EVAL: Patient is a 52 y.o. M who was seen today for physical therapy evaluation and treatment for cervicalgia 1 year s/p ACDF and diffuse pain due to fibromyalgia. Pt moves slowly in sit>stand with right foot placed behind for push which is consistent with his complaints of recent onset of left lateral knee and ankle pain. Since the onset of COVID pandemic, pt has had a significant decrease in daily activities, going from working 2 full-time jobs to difficulty with a single part-time due to fluctuations in fibromyalgia. We did discuss the neuropsychology component of hypersensitivity and diffuse pain and pt agreed to research a counselor or psychologist with this specialty.  I believe the pt will do very well in the water to return to musculoskeletal strengthening and generalized movement. Communication will  be held between pt and PT regarding pain during activity as well as in the days following.     REHAB POTENTIAL: Fair diagnosis  CLINICAL DECISION MAKING: Evolving/moderate complexity  EVALUATION COMPLEXITY: Low   GOALS: Goals reviewed with patient? Yes  SHORT TERM GOALS: Target date: 9/14  Pt will be performing walking program on a daily basis with understanding of rest breaks/modifications necessary Baseline: Goal status: Met 09/27/22    LONG TERM GOALS: Target date: POC date  UEFI to improve by MDC Baseline:  Goal status: INITIAL  2.  Each MMT via hand held dynamometry to increase by 10+lb Baseline:  Goal status: INITIAL  3.  Able to lift objects <10lb throughout his day without dropping them or arms "giving out" Baseline:  Goal status: INITIAL  4.  Pt will verbalize increased tolerance to work activities and postures necessary Baseline:  Goal status: INITIAL     PLAN:  PT FREQUENCY: 1-2x/week  PT DURATION:  other: through POC date  PLANNED INTERVENTIONS: Therapeutic exercises, Therapeutic activity, Neuromuscular re-education, Balance training, Gait training, Patient/Family education, Self Care, Joint mobilization, Stair training, Aquatic Therapy, Dry Needling, Spinal mobilization, Cryotherapy, Moist heat, Taping, Ionotophoresis 4mg /ml Dexamethasone, Manual therapy, and Re-evaluation.  PLAN FOR NEXT SESSION: Discussed plan to recert at end of cert adding land based intervention for more focused attention to cervicalgia and shoulder discomfort.  continue aquatics,add/return land based intervention  Rushie Chestnut) Kaelene Elliston MPT 10/19/22 3:37 PM [facility] [address] [place], [place], [address] Phone: [phone]   Fax:  [phone]

## 2022-10-20 ENCOUNTER — Ambulatory Visit: Payer: BC Managed Care – PPO | Admitting: Physical Medicine & Rehabilitation

## 2022-10-20 ENCOUNTER — Telehealth: Payer: Self-pay | Admitting: Physical Medicine & Rehabilitation

## 2022-10-20 NOTE — Telephone Encounter (Signed)
We have never prescribed and he has a VM that has not been set up.

## 2022-10-20 NOTE — Telephone Encounter (Signed)
Patient needs a refill on Voltaren.  Please call patient.

## 2022-10-23 DIAGNOSIS — F902 Attention-deficit hyperactivity disorder, combined type: Secondary | ICD-10-CM | POA: Diagnosis not present

## 2022-10-23 DIAGNOSIS — F331 Major depressive disorder, recurrent, moderate: Secondary | ICD-10-CM | POA: Diagnosis not present

## 2022-10-23 DIAGNOSIS — F411 Generalized anxiety disorder: Secondary | ICD-10-CM | POA: Diagnosis not present

## 2022-10-24 ENCOUNTER — Ambulatory Visit (HOSPITAL_BASED_OUTPATIENT_CLINIC_OR_DEPARTMENT_OTHER): Payer: BC Managed Care – PPO | Admitting: Physical Therapy

## 2022-10-24 ENCOUNTER — Encounter (HOSPITAL_BASED_OUTPATIENT_CLINIC_OR_DEPARTMENT_OTHER): Payer: Self-pay | Admitting: Physical Therapy

## 2022-10-24 DIAGNOSIS — M6281 Muscle weakness (generalized): Secondary | ICD-10-CM | POA: Diagnosis not present

## 2022-10-24 DIAGNOSIS — R252 Cramp and spasm: Secondary | ICD-10-CM | POA: Diagnosis not present

## 2022-10-24 DIAGNOSIS — M542 Cervicalgia: Secondary | ICD-10-CM | POA: Diagnosis not present

## 2022-10-24 NOTE — Therapy (Signed)
OUTPATIENT PHYSICAL THERAPY TREATMENT   Patient Name: Henry Jennings MRN: [id number] DOB:28-Sep-1970, 52 y.o., male Today's Date: 10/24/2022  END OF SESSION:  PT End of Session - 10/24/22 1456     Visit Number 8    Number of Visits 13    Date for PT Re-Evaluation 10/28/22    PT Start Time 1448    PT Stop Time 1530    PT Time Calculation (min) 42 min    Activity Tolerance Patient tolerated treatment well    Behavior During Therapy [facility] for tasks assessed/performed               Past Medical History:  Diagnosis Date   Depression    Herniated disc, cervical    Past Surgical History:  Procedure Laterality Date   ANTERIOR CERVICAL DECOMP/DISCECTOMY FUSION N/A 08/02/2021   Procedure: C4-5, C5-6, C6-7, C7-T1 ANTERIOR CERVICAL DISCECTOMY AND FUSION WITH PLATES AND SCREWS;  Surgeon: Kerrin Champagne, MD;  Location: [facility] OR;  Service: Orthopedics;  Laterality: N/A;   colonscopy  2021   WISDOM TOOTH EXTRACTION Left 2020   Left lower   Patient Active Problem List   Diagnosis Date Noted   Spinal stenosis of cervical region    Cervical disc herniation    Other spondylosis with myelopathy, cervical region    S/P cervical spinal fusion 08/02/2021   Anxiety 06/06/2021   Bipolar disorder (HCC) 06/06/2021   Chronic fatigue syndrome 06/06/2021   Fibromyalgia 06/06/2021   Constipation 06/06/2021   Insomnia 06/06/2021   Panic disorder 06/06/2021   Gait abnormality 06/06/2021   Neck pain 06/06/2021    REFERRING PROVIDER: Erick Colace, MD   REFERRING DIAG: M79.7 (ICD-10-CM) - Fibromyalgia   Rationale for Evaluation and Treatment: Rehabilitation  THERAPY DIAG:  Cervicalgia  Muscle weakness (generalized)  Cramp and spasm  ONSET DATE: 2018   SUBJECTIVE:                                                                                                                                                                                           SUBJECTIVE STATEMENT: "Pain  automatically decreases when I enter the pool. 8/10 when I arrived, got in pool  went to 6/10 within a few minutes of just moving around."  FROM EVAL: Increasing issues since 2018. Went to PT and it seemed to help. Seemed ot get worse over the pandemic. Went to rheumatology due to other symptoms happening. No dx came from those visits. In 2022 was dx with fibromyalgia. ACDF Aug 2023. It took a lot longer to heal from that than I expected. From 2021 and on I was dropping  things and things were too heavy but a lot of it has gone away.  After too much lifting or 10+lb and arms will give. Most nerves are fine but there was one on both side that were limited in NCV testing.  Skin hypersensitivity that can be irritated by certain clothes.    PERTINENT HISTORY:  H/o migraines-sweatband and rams horn pattern  PAIN:  Are you having pain? Yes: NPRS scale: 8/10 Pain location: generalized Pain description: achy, sharp with certain motions Aggravating factors: never know Relieving factors: hot shower, CBD, rotate hot and cold, massage  PRECAUTIONS:  None  RED FLAGS: None   WEIGHT BEARING RESTRICTIONS:  No  FALLS:  Has patient fallen in last 6 months?  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    OBJECTIVE:   DIAGNOSTIC FINDINGS:  X-ray of the cervical spine taken on 08/17/2022 was independently reviewed  and interpreted, showing C4-7 ACDF.  Allografts are in appropriate position. Anterior plate and screws are in good position. There is no lucency around the screws or the interbody cages. No fracture or dislocation seen.  From MD note: His EMG does not show any signs of neuropathy or ulnar neuropathy. He does have decreased recruitment in tricep muscles bilaterally but no significant tricep  weakness.  PATIENT SURVEYS:  UEFS eval: 46  COGNITIVE STATUS: Within functional limits for tasks assessed    POSTURE:  increased lumbar lordosis  HAND DOMINANCE:  Right   Body Part #1 Cervical  PALPATION: Eval: more spasm noted in Lt upper traps  UE MMT UPPER EXTREMITY MMT:  MMT Right eval Left eval  Shoulder flexion 12.8 12.4  Shoulder extension    Shoulder abduction    Shoulder adduction    Shoulder extension    Shoulder internal rotation    Shoulder external rotation 11.1 7.7  Middle trapezius    Lower trapezius    Elbow flexion    Elbow extension     (Blank rows = not tested)       Body Part #2 Hip    LE MMT LOWER EXTREMITY MMT:    MMT Right eval Left eval  Hip flexion 19.5 19.8  Hip extension    Hip abduction 23.2 26.5  Hip adduction    Hip internal rotation    Hip external rotation    Knee flexion    Knee extension 26.4 31.3  Ankle dorsiflexion    Ankle plantarflexion    Ankle inversion    Ankle eversion     (Blank rows = not tested)     FUNCTIONAL TESTS:  EVAL: 30 seconds chair stand test 5;    places right foot under chair to press up, does not use UEs   TREATMENT:  Pt seen for aquatic therapy today.  Treatment took place in water 3.5-4.75 ft in depth at the [facility] pool. Temp of water was 91.  Pt entered/exited the pool via stairs independently with bilat rail.  * unsupported walking forward / backward with reciprocal arm swing  x 4 laps *Side stepping with shoulder add/abd x 2 width (improving coordination) *cycling on  noodle ue support HB x 4 widths ue breast stroke: hip add/bad x 10 *Horizontal shoulder add/abd staggered  stance *Bow&arrow  *HB carry for core engagement x 2 widths forward, back and side stepping * wall push up/off x 12 *Hip hiking bottom step 2 x 10 R/L *hip hinging.   x10 *noodle stomp 10 fast then 10 slow   Pt requires the buoyancy and hydrostatic pressure of water for support, and to offload joints by unweighting joint load by at least 50 % in navel deep water and by at least 75-80% in chest to neck deep water.  Viscosity of the water is needed for resistance of strengthening. Water current perturbations provides challenge to standing balance requiring increased core activation.      PATIENT EDUCATION:  Education details:exercise form/rationale Person educated: Patient Education method: Explanation, Demonstration, Actor cues, and Verbal cues Education comprehension: verbalized understanding, returned demonstration, verbal cues required, tactile cues required, and needs further education  HOME EXERCISE PROGRAM: Walking program  Research counselor/psych with chronic pain/fibromyalgia specialty  Aquatic This aquatic home exercise program from [employer] utilizes pictures from land based exercises, but has been adapted prior to lamination and issuance.   Access Code: [email or web address] URL: [email or web address] Date: 10/24/2022 Prepared by: Geni Bers  Exercises - Hand Buoy Carry  - Seated Straddle on Flotation Forward Breast Stroke Arms and Bicycle Legs  - Side lunge with hand buoys  - Push-Up on Pool Wall  - 1 x daily - 1-3 x weekly - 1-3 sets - 10 reps - Hip Hiking on Step  - 1 x daily - 1-3 x weekly - 1-3 sets - 10 reps - Standing Hip Hinge  - 1 x daily - 1-3 x weekly - 1-2 sets - 10 reps - Noodle Stomp  - 1 x daily - 1-3 x weekly - 1-2 sets - 10 reps - Noodle press  - 1 x daily - 1-3 x weekly - 1-2 sets - 10 reps - Standing Shoulder Horizontal Abduction with Resistance  - 1 x daily - 1-3 x weekly - 1-2 sets - 10 reps   ASSESSMENT:  CLINICAL IMPRESSION:  Pt wit good tolerance to progressive exercises.  Improved toleration to activity completing multiple exercises without recovery period.  Continues to improve with SLS and  sitting balance also with eccentric and concentric hip and knee flex.  Reports increased amb distance daily broken up into 3 sets of 1/2 mile distance. Goals ongoing   FROM EVAL: Patient is a 52 y.o. M who was seen today for physical therapy evaluation and treatment for cervicalgia 1 year s/p ACDF and diffuse pain due to fibromyalgia. Pt moves slowly in sit>stand with right foot placed behind for push which is consistent with his complaints of recent onset of left lateral knee and ankle pain. Since the onset of COVID pandemic, pt has had a significant decrease in daily activities, going from working 2 full-time jobs to difficulty with a single part-time due to fluctuations in fibromyalgia. We did discuss the neuropsychology component of hypersensitivity and diffuse pain and pt agreed to research a counselor or psychologist with this specialty.  I believe the pt will do very well in the water to return to musculoskeletal strengthening and generalized movement. Communication will be held between pt and PT regarding pain during activity as well as in the days following.     REHAB POTENTIAL: Fair diagnosis  CLINICAL DECISION MAKING: Evolving/moderate complexity  EVALUATION COMPLEXITY: Low   GOALS: Goals reviewed with patient? Yes  SHORT TERM GOALS: Target date: 9/14  Pt will be performing walking program on a daily basis with understanding of rest breaks/modifications necessary Baseline: Goal status: Met 09/27/22    LONG TERM GOALS: Target date: POC date  UEFI to improve by MDC Baseline:  Goal status: INITIAL  2.  Each MMT via hand held dynamometry to increase by 10+lb Baseline:  Goal status: INITIAL  3.  Able to lift objects <10lb throughout his day without dropping them or arms "giving out" Baseline:  Goal status: INITIAL  4.  Pt will verbalize increased tolerance to work activities and postures necessary Baseline:  Goal status: INITIAL     PLAN:  PT FREQUENCY:  1-2x/week  PT DURATION: other: through POC date  PLANNED INTERVENTIONS: Therapeutic exercises, Therapeutic activity, Neuromuscular re-education, Balance training, Gait training, Patient/Family education, Self Care, Joint mobilization, Stair training, Aquatic Therapy, Dry Needling, Spinal mobilization, Cryotherapy, Moist heat, Taping, Ionotophoresis 4mg /ml Dexamethasone, Manual therapy, and Re-evaluation.  PLAN FOR NEXT SESSION: Discussed plan to recert at end of cert adding land based intervention for more focused attention to cervicalgia and shoulder discomfort.  continue aquatics,add/return land based intervention  Rushie Chestnut) Tomeeka Plaugher MPT 10/24/22 3:02 PM [facility] [address] [place], [place], [address] Phone: [phone]   Fax:  [phone]

## 2022-10-26 ENCOUNTER — Ambulatory Visit (HOSPITAL_BASED_OUTPATIENT_CLINIC_OR_DEPARTMENT_OTHER): Payer: BC Managed Care – PPO | Admitting: Physical Therapy

## 2022-10-26 ENCOUNTER — Encounter (HOSPITAL_BASED_OUTPATIENT_CLINIC_OR_DEPARTMENT_OTHER): Payer: Self-pay | Admitting: Physical Therapy

## 2022-10-26 DIAGNOSIS — R252 Cramp and spasm: Secondary | ICD-10-CM | POA: Diagnosis not present

## 2022-10-26 DIAGNOSIS — M6281 Muscle weakness (generalized): Secondary | ICD-10-CM | POA: Diagnosis not present

## 2022-10-26 DIAGNOSIS — M542 Cervicalgia: Secondary | ICD-10-CM | POA: Diagnosis not present

## 2022-10-26 NOTE — Therapy (Signed)
OUTPATIENT PHYSICAL THERAPY TREATMENT   Patient Name: Henry Jennings MRN: [id number] DOB:[birth year], 52 y.o., male Today's Date: 10/26/2022  END OF SESSION:  PT End of Session - 10/26/22 1448     Visit Number 9    Number of Visits 14    Date for PT Re-Evaluation 12/01/22    PT Start Time 1446    PT Stop Time 1530    PT Time Calculation (min) 44 min    Activity Tolerance Patient tolerated treatment well    Behavior During Therapy [facility] for tasks assessed/performed               Past Medical History:  Diagnosis Date   Depression    Herniated disc, cervical    Past Surgical History:  Procedure Laterality Date   ANTERIOR CERVICAL DECOMP/DISCECTOMY FUSION N/A 08/02/2021   Procedure: C4-5, C5-6, C6-7, C7-T1 ANTERIOR CERVICAL DISCECTOMY AND FUSION WITH PLATES AND SCREWS;  Surgeon: Kerrin Champagne, MD;  Location: [facility] OR;  Service: Orthopedics;  Laterality: N/A;   colonscopy  2021   WISDOM TOOTH EXTRACTION Left 2020   Left lower   Patient Active Problem List   Diagnosis Date Noted   Spinal stenosis of cervical region    Cervical disc herniation    Other spondylosis with myelopathy, cervical region    S/P cervical spinal fusion 08/02/2021   Anxiety 06/06/2021   Bipolar disorder (HCC) 06/06/2021   Chronic fatigue syndrome 06/06/2021   Fibromyalgia 06/06/2021   Constipation 06/06/2021   Insomnia 06/06/2021   Panic disorder 06/06/2021   Gait abnormality 06/06/2021   Neck pain 06/06/2021    REFERRING PROVIDER: Erick Colace, MD   REFERRING DIAG: M79.7 (ICD-10-CM) - Fibromyalgia   Rationale for Evaluation and Treatment: Rehabilitation  THERAPY DIAG:  Cervicalgia  Muscle weakness (generalized)  Cramp and spasm  ONSET DATE: 2018   SUBJECTIVE:                                                                                                                                                                                           SUBJECTIVE STATEMENT: "My pain  has been a little up last week may be due to adding 3 walk x 1/2 mile daily"  FROM EVAL: Increasing issues since 2018. Went to PT and it seemed to help. Seemed ot get worse over the pandemic. Went to rheumatology due to other symptoms happening. No dx came from those visits. In 2022 was dx with fibromyalgia. ACDF Aug 2023. It took a lot longer to heal from that than I expected. From 2021 and on I was dropping things and things were too heavy but  a lot of it has gone away.  After too much lifting or 10+lb and arms will give. Most nerves are fine but there was one on both side that were limited in NCV testing.  Skin hypersensitivity that can be irritated by certain clothes.    PERTINENT HISTORY:  H/o migraines-sweatband and rams horn pattern  PAIN:  Are you having pain? Yes: NPRS scale: 6.5/10 Pain location: generalized Pain description: achy, sharp with certain motions Aggravating factors: never know Relieving factors: hot shower, CBD, rotate hot and cold, massage  PRECAUTIONS:  None  RED FLAGS: None   WEIGHT BEARING RESTRICTIONS:  No  FALLS:  Has patient fallen in last 6 months?  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    OBJECTIVE:   DIAGNOSTIC FINDINGS:  X-ray of the cervical spine taken on 08/17/2022 was independently reviewed  and interpreted, showing C4-7 ACDF.  Allografts are in appropriate position. Anterior plate and screws are in good position. There is no lucency around the screws or the interbody cages. No fracture or dislocation seen.  From MD note: His EMG does not show any signs of neuropathy or ulnar neuropathy. He does have decreased recruitment in tricep muscles bilaterally but no significant tricep weakness.  PATIENT SURVEYS:  UEFS eval: 46  COGNITIVE  STATUS: Within functional limits for tasks assessed    POSTURE:  increased lumbar lordosis  HAND DOMINANCE:  Right   Body Part #1 Cervical  PALPATION: Eval: more spasm noted in Lt upper traps  UE MMT UPPER EXTREMITY MMT:  MMT Right eval Left eval  Shoulder flexion 12.8 12.4  Shoulder extension    Shoulder abduction    Shoulder adduction    Shoulder extension    Shoulder internal rotation    Shoulder external rotation 11.1 7.7  Middle trapezius    Lower trapezius    Elbow flexion    Elbow extension     (Blank rows = not tested)       Body Part #2 Hip    LE MMT LOWER EXTREMITY MMT:    MMT Right eval Left eval R /L 10/24  Hip flexion 19.5 19.8 43.9 / 42.6  Hip extension     Hip abduction 23.2 26.5 29.0 / 27.9  Hip adduction     Hip internal rotation     Hip external rotation     Knee flexion     Knee extension 26.4 31.3 43.2 / 43.9  Ankle dorsiflexion     Ankle plantarflexion     Ankle inversion     Ankle eversion      (Blank rows = not tested)     FUNCTIONAL TESTS:  EVAL: 30 seconds chair stand test 5;    places right foot under chair to press up, does not use UEs   10/26/22: 30s sts: 9  TREATMENT:  Re-cert completed   Pt seen for aquatic therapy today.  Treatment took place in water 3.5-4.75 ft in depth at the [facility] pool. Temp of water was 91.  Pt entered/exited the pool via stairs independently with bilat rail.  -Hand Buoy Carry yellow HB forward, side stepping and backward  - Seated Straddle on Flotation Forward Breast Stroke Arms and Bicycle Legs  - Side lunge with hand buoys x 2 widths shoulder add/abd - Push-Up on Pool Wall  2x10 reps - Hip Hiking on Step  2x10 reps - Standing Hip Hinge  10 reps - Noodle Stomp  - 1 x daily - 1-3 x weekly - 1-2 sets - 10 reps - Noodle press  - 1 x daily - 1-3 x  weekly - 1-2 sets - 10 reps - Standing Shoulder Horizontal Abduction with Resistance    Pt requires the buoyancy and hydrostatic pressure of water for support, and to offload joints by unweighting joint load by at least 50 % in navel deep water and by at least 75-80% in chest to neck deep water.  Viscosity of the water is needed for resistance of strengthening. Water current perturbations provides challenge to standing balance requiring increased core activation.      PATIENT EDUCATION:  Education details:exercise form/rationale Person educated: Patient Education method: Explanation, Demonstration, Actor cues, and Verbal cues Education comprehension: verbalized understanding, returned demonstration, verbal cues required, tactile cues required, and needs further education  HOME EXERCISE PROGRAM: Walking program  Research counselor/psych with chronic pain/fibromyalgia specialty  Aquatic This aquatic home exercise program from [employer] utilizes pictures from land based exercises, but has been adapted prior to lamination and issuance.   Access Code: [email or web address] URL: [email or web address] Date: 10/24/2022 Prepared by: Geni Bers  Exercises - Hand Buoy Carry  - Seated Straddle on Flotation Forward Breast Stroke Arms and Bicycle Legs  - Side lunge with hand buoys  - Push-Up on Pool Wall  - 1 x daily - 1-3 x weekly - 1-3 sets - 10 reps - Hip Hiking on Step  - 1 x daily - 1-3 x weekly - 1-3 sets - 10 reps - Standing Hip Hinge  - 1 x daily - 1-3 x weekly - 1-2 sets - 10 reps - Noodle Stomp  - 1 x daily - 1-3 x weekly - 1-2 sets - 10 reps - Noodle press  - 1 x daily - 1-3 x weekly - 1-2 sets - 10 reps - Standing Shoulder Horizontal Abduction with Resistance  - 1 x daily - 1-3 x weekly - 1-2 sets - 10 reps   ASSESSMENT:  CLINICAL IMPRESSION:  Re-cert/PN: Pt reports he has improved his sitting postures while sitting and working  and along with the aquatic PT has  decreased overall pain, with higher or max pain sensitivity less frequent. He has had only a few decreases in pain to 2-3/10 with ~5/10 being the average.  He has increased his walking (program) to 3 x daily 1/2 mile which appears to have some relation to his recent slight increase in pain. He is advised to complete just 3 x per week rather than daily.  As in chart above he has had a significant improvement in LE strength.  He has had good tolerance to aquatic intervention and has reached his max potential in setting.  He will return for 1 more visit to ensure indep with aquatic final HEP then return to land intervention for remainder of this certification to progress toward lifting and UE strength  goals and add more focused attention to cervicalgia and shoulder discomfort.    FROM EVAL: Patient is a 52 y.o. M who was seen today for physical therapy evaluation and treatment for cervicalgia 1 year s/p ACDF and diffuse pain due to fibromyalgia. Pt moves slowly in sit>stand with right foot placed behind for push which is consistent with his complaints of recent onset of left lateral knee and ankle pain. Since the onset of COVID pandemic, pt has had a significant decrease in daily activities, going from working 2 full-time jobs to difficulty with a single part-time due to fluctuations in fibromyalgia. We did discuss the neuropsychology component of hypersensitivity and diffuse pain and pt agreed to research a counselor or psychologist with this specialty.  I believe the pt will do very well in the water to return to musculoskeletal strengthening and generalized movement. Communication will be held between pt and PT regarding pain during activity as well as in the days following.     REHAB POTENTIAL: Fair diagnosis  CLINICAL DECISION MAKING: Evolving/moderate complexity  EVALUATION COMPLEXITY: Low   GOALS: Goals reviewed with patient? Yes  SHORT TERM GOALS: Target date: 9/14  Pt will be performing  walking program on a daily basis with understanding of rest breaks/modifications necessary Baseline: Goal status: Met 09/27/22    LONG TERM GOALS: Target date:12/01/22  UEFI to improve by MDC Baseline:  Goal status: ongoing 10/26/22  2.  Each MMT via hand held dynamometry to increase by 10+lb Baseline:  Goal status: ongoing 10/26/22  3.  Able to lift objects <10lb throughout his day without dropping them or arms "giving out" Baseline:  Goal status: Partially met (LE) 10/26/22  4.  Pt will verbalize increased tolerance to work activities and postures necessary Baseline:  Goal status: Met 10/26/22     PLAN:  PT FREQUENCY: 1-2x/week  PT DURATION: 5 weeks  PLANNED INTERVENTIONS: Therapeutic exercises, Therapeutic activity, Neuromuscular re-education, Balance training, Gait training, Patient/Family education, Self Care, Joint mobilization, Stair training, Aquatic Therapy, Dry Needling, Spinal mobilization, Cryotherapy, Moist heat, Taping, Ionotophoresis 4mg /ml Dexamethasone, Manual therapy, and Re-evaluation.  PLAN FOR NEXT SESSION: Discussed plan to recert at end of cert adding land based intervention for more focused attention to cervicalgia and shoulder discomfort.  return land based intervention  Rushie Chestnut) Jaena Brocato MPT 10/26/22 3:24 PM [facility] [address] [place], [place], [address] Phone: [phone]   Fax:  [phone]

## 2022-10-27 ENCOUNTER — Encounter: Payer: Self-pay | Admitting: Physical Medicine & Rehabilitation

## 2022-10-27 ENCOUNTER — Encounter
Payer: BC Managed Care – PPO | Attending: Physical Medicine & Rehabilitation | Admitting: Physical Medicine & Rehabilitation

## 2022-10-27 VITALS — BP 136/92 | HR 85 | Ht 65.0 in | Wt 171.0 lb

## 2022-10-27 DIAGNOSIS — M7542 Impingement syndrome of left shoulder: Secondary | ICD-10-CM | POA: Insufficient documentation

## 2022-10-27 MED ORDER — GABAPENTIN 400 MG PO CAPS: 400.0000 mg | ORAL_CAPSULE | Freq: Two times a day (BID) | ORAL | 5 refills | Status: DC

## 2022-10-27 MED ORDER — BETAMETHASONE SOD PHOS & ACET 6 (3-3) MG/ML IJ SUSP
6.0000 mg | Freq: Once | INTRAMUSCULAR | Status: AC
  Administered 2022-10-27: 6 mg via INTRAMUSCULAR

## 2022-10-27 MED ORDER — LIDOCAINE HCL 1 % IJ SOLN
4.0000 mL | Freq: Once | INTRAMUSCULAR | Status: AC
  Administered 2022-10-27: 4 mL

## 2022-10-27 MED ORDER — DICLOFENAC SODIUM 75 MG PO TBEC: 75.0000 mg | DELAYED_RELEASE_TABLET | Freq: Two times a day (BID) | ORAL | 0 refills | Status: DC

## 2022-10-27 NOTE — Progress Notes (Signed)
Subjective:    Patient ID: Henry Jennings, male    DOB: 04-26-1970, 52 y.o.   MRN: [id number]  HPI 52 year old male with history of severe cervical spinal stenosis with cord signal abnormalities requiring C4-T1 decompression and fusion performed by Dr. Otelia Sergeant approximately 2 years ago.  The patient states that he is overall better than prior to surgery but was hoping that he may get back to normal.  He has been going through some outpatient therapy.  He is complaining of left shoulder pain as a primary issue.  His average pain is 5-6 out of 10 but sometimes goes up as high as 7-8 with activity.  Pain is described as sharp intermittent stabbing and aching.  Mainly trapezius area mid back arms and legs bilaterally.  Pain and improving with rest and exercise as well as medication.  He is currently on duloxetine 60 mg twice daily as well as gabapentin 400 mg twice daily he states that this generally does help with his allover body pain which has also been discussed as being related to fibromyalgia. Some days he feels like he may need a little bit more medicine this is about once or twice every other week The patient is independent with all self-care and mobility Pain Inventory Average Pain 6 Pain Right Now 7 My pain is intermittent, constant, sharp, stabbing, and aching  In the last 24 hours, has pain interfered with the following? General activity 8 Relation with others 5 Enjoyment of life 4 What TIME of day is your pain at its worst? morning , evening, and night Sleep (in general) Fair  Pain is worse with: walking, bending, sitting, inactivity, and some activites Pain improves with: rest, therapy/exercise, and medication Relief from Meds: 6  No family history on file. Social History   Socioeconomic History   Marital status: Married    Spouse name: [name]   Number of children: Not on file   Years of education: Not on file   Highest education level: Not on file  Occupational History    Not on file  Tobacco Use   Smoking status: Never   Smokeless tobacco: Never  Vaping Use   Vaping status: Never Used  Substance and Sexual Activity   Alcohol use: Not Currently   Drug use: Never   Sexual activity: Yes  Other Topics Concern   Not on file  Social History Narrative   Not on file   Social Determinants of Health   Financial Resource Strain: Not on file  Food Insecurity: Not on file  Transportation Needs: Not on file  Physical Activity: Not on file  Stress: Not on file  Social Connections: Not on file   Past Surgical History:  Procedure Laterality Date   ANTERIOR CERVICAL DECOMP/DISCECTOMY FUSION N/A 08/02/2021   Procedure: C4-5, C5-6, C6-7, C7-T1 ANTERIOR CERVICAL DISCECTOMY AND FUSION WITH PLATES AND SCREWS;  Surgeon: Kerrin Champagne, MD;  Location: [facility] OR;  Service: Orthopedics;  Laterality: N/A;   colonscopy  2021   WISDOM TOOTH EXTRACTION Left 2020   Left lower   Past Surgical History:  Procedure Laterality Date   ANTERIOR CERVICAL DECOMP/DISCECTOMY FUSION N/A 08/02/2021   Procedure: C4-5, C5-6, C6-7, C7-T1 ANTERIOR CERVICAL DISCECTOMY AND FUSION WITH PLATES AND SCREWS;  Surgeon: Kerrin Champagne, MD;  Location: [facility] OR;  Service: Orthopedics;  Laterality: N/A;   colonscopy  2021   WISDOM TOOTH EXTRACTION Left 2020   Left lower   Past Medical History:  Diagnosis Date   Depression  Herniated disc, cervical    BP (!) 136/92   Pulse 85   Ht 5\' 5"  (1.651 m)   Wt 171 lb (77.6 kg)   SpO2 98%   BMI 28.46 kg/m   Opioid Risk Score:   Fall Risk Score:  `1  Depression screen PHQ 2/9     06/06/2022    2:23 PM 03/02/2022    2:07 PM 12/29/2021    2:37 PM 11/04/2021   11:53 AM  Depression screen PHQ 2/9  Decreased Interest 1 0 0 1  Down, Depressed, Hopeless 1 0 0 2  PHQ - 2 Score 2 0 0 3  Altered sleeping    1  Tired, decreased energy    3  Change in appetite    2  Feeling bad or failure about yourself     3  Trouble concentrating    1  Moving slowly or  fidgety/restless    1  Suicidal thoughts    0  PHQ-9 Score    14      Review of Systems  Musculoskeletal:  Positive for back pain.       Bilateral ankle, shin, knee pain Bilateral arm pain  All other systems reviewed and are negative.     Objective:   Physical Exam   Motor strength is 5/5 bilateral deltoid by stress grip Positive impingement sign left shoulder there is no evidence of cuff atrophy. Sensation normal bilateral upper limbs Ambulates without assistive device no evidence toe drag or knee instability     Assessment & Plan:    #1.  Cervical spinal stenosis has some residual pain which may be related to the abnormal biomechanics from his extensive cervical fusion.  In addition he does have some cord signal abnormalities that predated his surgery and mainly involve paresthesias.  In addition he also has fibromyalgia syndrome.  This complex pain issue.  We discussed that from a postop recovery standpoint he is likely at a plateau stage. I will see him back in around 3 months.  Have written for an extra 10 tablets of gabapentin per month that he can use on days when he has increased pain he can take an extra 1 or 2 tablet during those days. 2.  Shoulder impingement syndrome left side continue physical therapy but is only slowly improving will do injection today  Shoulder injection Left    Indication:Left Shoulder pain not relieved by medication management and other conservative care.  Informed consent was obtained after describing risks and benefits of the procedure with the patient, this includes bleeding, bruising, infection and medication side effects. The patient wishes to proceed and has given written consent. Patient was placed in a seated position. The Left shoulder was marked and prepped with betadine in the subacromial area. A 25-gauge 1-1/2 inch needle was inserted into the subacromial area. After negative draw back for blood, a solution containing 1 mL of 6 mg per  ML betamethasone and 4 mL of 1% lidocaine was injected. A band aid was applied. The patient tolerated the procedure well. Post procedure instructions were given.

## 2022-11-01 ENCOUNTER — Encounter (HOSPITAL_BASED_OUTPATIENT_CLINIC_OR_DEPARTMENT_OTHER): Payer: Self-pay | Admitting: Physical Therapy

## 2022-11-01 ENCOUNTER — Ambulatory Visit (HOSPITAL_BASED_OUTPATIENT_CLINIC_OR_DEPARTMENT_OTHER): Payer: BC Managed Care – PPO | Admitting: Physical Therapy

## 2022-11-01 DIAGNOSIS — M6281 Muscle weakness (generalized): Secondary | ICD-10-CM | POA: Diagnosis not present

## 2022-11-01 DIAGNOSIS — R252 Cramp and spasm: Secondary | ICD-10-CM

## 2022-11-01 DIAGNOSIS — M542 Cervicalgia: Secondary | ICD-10-CM | POA: Diagnosis not present

## 2022-11-01 NOTE — Therapy (Signed)
OUTPATIENT PHYSICAL THERAPY TREATMENT   Patient Name: Henry Jennings MRN: [id number] DOB:05-Sep-1970, 52 y.o., male Today's Date: 11/01/2022  END OF SESSION:  PT End of Session - 11/01/22 1340     Visit Number 10    Number of Visits 14    Date for PT Re-Evaluation 12/01/22    PT Start Time 1337    PT Stop Time 1415    PT Time Calculation (min) 38 min    Activity Tolerance Patient tolerated treatment well    Behavior During Therapy WFL for tasks assessed/performed               Past Medical History:  Diagnosis Date   Depression    Herniated disc, cervical    Past Surgical History:  Procedure Laterality Date   ANTERIOR CERVICAL DECOMP/DISCECTOMY FUSION N/A 08/02/2021   Procedure: C4-5, C5-6, C6-7, C7-T1 ANTERIOR CERVICAL DISCECTOMY AND FUSION WITH PLATES AND SCREWS;  Surgeon: Kerrin Champagne, MD;  Location: [facility] OR;  Service: Orthopedics;  Laterality: N/A;   colonscopy  2021   WISDOM TOOTH EXTRACTION Left 2020   Left lower   Patient Active Problem List   Diagnosis Date Noted   Spinal stenosis of cervical region    Cervical disc herniation    Other spondylosis with myelopathy, cervical region    S/P cervical spinal fusion 08/02/2021   Anxiety 06/06/2021   Bipolar disorder (HCC) 06/06/2021   Chronic fatigue syndrome 06/06/2021   Fibromyalgia 06/06/2021   Constipation 06/06/2021   Insomnia 06/06/2021   Panic disorder 06/06/2021   Gait abnormality 06/06/2021   Neck pain 06/06/2021    REFERRING PROVIDER: Erick Colace, MD   REFERRING DIAG: M79.7 (ICD-10-CM) - Fibromyalgia   Rationale for Evaluation and Treatment: Rehabilitation  THERAPY DIAG:  Cervicalgia  Muscle weakness (generalized)  Cramp and spasm  ONSET DATE: 2018   SUBJECTIVE:                                                                                                                                                                                           SUBJECTIVE STATEMENT: "My pain  is in between m shoulder blades and neck.  Legs and LB ok"  FROM EVAL: Increasing issues since 2018. Went to PT and it seemed to help. Seemed ot get worse over the pandemic. Went to rheumatology due to other symptoms happening. No dx came from those visits. In 2022 was dx with fibromyalgia. ACDF Aug 2023. It took a lot longer to heal from that than I expected. From 2021 and on I was dropping things and things were too heavy but a lot of it has  gone away.  After too much lifting or 10+lb and arms will give. Most nerves are fine but there was one on both side that were limited in NCV testing.  Skin hypersensitivity that can be irritated by certain clothes.    PERTINENT HISTORY:  H/o migraines-sweatband and rams horn pattern  PAIN:  Are you having pain? Yes: NPRS scale: 6.5/10 Pain location: generalized Pain description: achy, sharp with certain motions Aggravating factors: never know Relieving factors: hot shower, CBD, rotate hot and cold, massage  PRECAUTIONS:  None  RED FLAGS: None   WEIGHT BEARING RESTRICTIONS:  No  FALLS:  Has patient fallen in last 6 months?  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    OBJECTIVE:   DIAGNOSTIC FINDINGS:  X-ray of the cervical spine taken on 08/17/2022 was independently reviewed  and interpreted, showing C4-7 ACDF.  Allografts are in appropriate position. Anterior plate and screws are in good position. There is no lucency around the screws or the interbody cages. No fracture or dislocation seen.  From MD note: His EMG does not show any signs of neuropathy or ulnar neuropathy. He does have decreased recruitment in tricep muscles bilaterally but no significant tricep weakness.  PATIENT SURVEYS:  UEFS eval: 46  COGNITIVE STATUS: Within  functional limits for tasks assessed    POSTURE:  increased lumbar lordosis  HAND DOMINANCE:  Right   Body Part #1 Cervical  PALPATION: Eval: more spasm noted in Lt upper traps  UE MMT UPPER EXTREMITY MMT:  MMT Right eval Left eval  Shoulder flexion 12.8 12.4  Shoulder extension    Shoulder abduction    Shoulder adduction    Shoulder extension    Shoulder internal rotation    Shoulder external rotation 11.1 7.7  Middle trapezius    Lower trapezius    Elbow flexion    Elbow extension     (Blank rows = not tested)       Body Part #2 Hip    LE MMT LOWER EXTREMITY MMT:    MMT Right eval Left eval R /L 10/24  Hip flexion 19.5 19.8 43.9 / 42.6  Hip extension     Hip abduction 23.2 26.5 29.0 / 27.9  Hip adduction     Hip internal rotation     Hip external rotation     Knee flexion     Knee extension 26.4 31.3 43.2 / 43.9  Ankle dorsiflexion     Ankle plantarflexion     Ankle inversion     Ankle eversion      (Blank rows = not tested)     FUNCTIONAL TESTS:  EVAL: 30 seconds chair stand test 5;    places right foot under chair to press up, does not use UEs   10/26/22: 30s sts: 9  TREATMENT:  Pt seen for aquatic therapy today.  Treatment took place in water 3.5-4.75 ft in depth at the [facility] pool. Temp of water was 91.  Pt entered/exited the pool via stairs independently with bilat rail.  -Hand Buoy Carry yellow HB forward, side stepping and backward  - Seated Straddle on Flotation Forward Breast Stroke Arms and Bicycle Legs  - Side lunge with hand buoys x 2 widths shoulder add/abd - Push-Up on Pool Wall  2x10 reps - Hip Hiking on Step  2x10 reps - Standing Hip Hinge  10 reps - Noodle Stomp  - 1 x daily - 1-3 x weekly - 1-2 sets - 10 reps - Noodle press  - 1 x daily - 1-3 x weekly - 1-2 sets - 10 reps - Standing  Shoulder Horizontal Abduction with Resistance    Pt requires the buoyancy and hydrostatic pressure of water for support, and to offload joints by unweighting joint load by at least 50 % in navel deep water and by at least 75-80% in chest to neck deep water.  Viscosity of the water is needed for resistance of strengthening. Water current perturbations provides challenge to standing balance requiring increased core activation.      PATIENT EDUCATION:  Education details:exercise form/rationale Person educated: Patient Education method: Explanation, Demonstration, Actor cues, and Verbal cues Education comprehension: verbalized understanding, returned demonstration, verbal cues required, tactile cues required, and needs further education  HOME EXERCISE PROGRAM: Walking program  Research counselor/psych with chronic pain/fibromyalgia specialty  Aquatic This aquatic home exercise program from [employer] utilizes pictures from land based exercises, but has been adapted prior to lamination and issuance.   Access Code: [email or web address] URL: [email or web address] Date: 10/24/2022 Prepared by: Geni Bers  Exercises - Hand Buoy Carry  - Seated Straddle on Flotation Forward Breast Stroke Arms and Bicycle Legs  - Side lunge with hand buoys  - Push-Up on Pool Wall  - 1 x daily - 1-3 x weekly - 1-3 sets - 10 reps - Hip Hiking on Step  - 1 x daily - 1-3 x weekly - 1-3 sets - 10 reps - Standing Hip Hinge  - 1 x daily - 1-3 x weekly - 1-2 sets - 10 reps - Noodle Stomp  - 1 x daily - 1-3 x weekly - 1-2 sets - 10 reps - Noodle press  - 1 x daily - 1-3 x weekly - 1-2 sets - 10 reps - Standing Shoulder Horizontal Abduction with Resistance  - 1 x daily - 1-3 x weekly - 1-2 sets - 10 reps   ASSESSMENT:  CLINICAL IMPRESSION: Pt presents for last aquatic session.  He is directed to complete HEP issued last visit.  He does require minor VC for execution.  He is able to complete bow&arrow with  good coordination.  Final instructions given on frequency of exercises, duration and reps/sets.  He VU.  He will return next session to land based therapy with focus to be on shoulders and cervical spine. See Plan    Re-cert/PN: Pt reports he has improved his sitting postures while sitting and working  and along with the aquatic PT has decreased overall pain, with higher or max pain sensitivity less frequent. He has had only a few decreases in pain to 2-3/10 with ~5/10 being the average.  He has increased his walking (program) to 3 x daily 1/2 mile which appears to have some relation to his recent slight increase in pain. He is advised to complete just 3 x per  week rather than daily.  As in chart above he has had a significant improvement in LE strength.  He has had good tolerance to aquatic intervention and has reached his max potential in setting.  He will return for 1 more visit to ensure indep with aquatic final HEP then return to land intervention for remainder of this certification to progress toward lifting and UE strength goals and add more focused attention to cervicalgia and shoulder discomfort.       REHAB POTENTIAL: Fair diagnosis  CLINICAL DECISION MAKING: Evolving/moderate complexity  EVALUATION COMPLEXITY: Low   GOALS: Goals reviewed with patient? Yes  SHORT TERM GOALS: Target date: 9/14  Pt will be performing walking program on a daily basis with understanding of rest breaks/modifications necessary Baseline: Goal status: Met 09/27/22    LONG TERM GOALS: Target date:12/01/22  UEFI to improve by MDC Baseline:  Goal status: ongoing 10/26/22  2.  Each MMT via hand held dynamometry to increase by 10+lb Baseline:  Goal status: ongoing 10/26/22  3.  Able to lift objects <10lb throughout his day without dropping them or arms "giving out" Baseline:  Goal status: Partially met (LE) 10/26/22  4.  Pt will verbalize increased tolerance to work activities and postures  necessary Baseline:  Goal status: Met 10/26/22     PLAN:  PT FREQUENCY: 1-2x/week  PT DURATION: 5 weeks  PLANNED INTERVENTIONS: Therapeutic exercises, Therapeutic activity, Neuromuscular re-education, Balance training, Gait training, Patient/Family education, Self Care, Joint mobilization, Stair training, Aquatic Therapy, Dry Needling, Spinal mobilization, Cryotherapy, Moist heat, Taping, Ionotophoresis 4mg /ml Dexamethasone, Manual therapy, and Re-evaluation.  PLAN FOR NEXT SESSION: Return land based intervention for shoulder and cervical intervention: consider DN, strengthening and ROM.  Be mindful that pt tends towards hypermobility  Rushie Chestnut) Kymani Shimabukuro MPT 11/01/22 6:23 PM [facility] [address] [place], [place], [address] Phone: [phone]   Fax:  [phone]

## 2022-11-06 ENCOUNTER — Encounter (HOSPITAL_BASED_OUTPATIENT_CLINIC_OR_DEPARTMENT_OTHER): Payer: Self-pay | Admitting: Physical Therapy

## 2022-11-06 DIAGNOSIS — F331 Major depressive disorder, recurrent, moderate: Secondary | ICD-10-CM | POA: Diagnosis not present

## 2022-11-06 DIAGNOSIS — F902 Attention-deficit hyperactivity disorder, combined type: Secondary | ICD-10-CM | POA: Diagnosis not present

## 2022-11-06 DIAGNOSIS — F411 Generalized anxiety disorder: Secondary | ICD-10-CM | POA: Diagnosis not present

## 2022-11-13 ENCOUNTER — Encounter (HOSPITAL_BASED_OUTPATIENT_CLINIC_OR_DEPARTMENT_OTHER): Payer: Self-pay | Admitting: Physical Therapy

## 2022-11-17 ENCOUNTER — Ambulatory Visit (HOSPITAL_BASED_OUTPATIENT_CLINIC_OR_DEPARTMENT_OTHER): Payer: BC Managed Care – PPO | Attending: Physical Medicine & Rehabilitation

## 2022-11-17 ENCOUNTER — Encounter (HOSPITAL_BASED_OUTPATIENT_CLINIC_OR_DEPARTMENT_OTHER): Payer: Self-pay

## 2022-11-17 DIAGNOSIS — M6281 Muscle weakness (generalized): Secondary | ICD-10-CM | POA: Diagnosis not present

## 2022-11-17 DIAGNOSIS — M542 Cervicalgia: Secondary | ICD-10-CM | POA: Diagnosis not present

## 2022-11-17 DIAGNOSIS — R252 Cramp and spasm: Secondary | ICD-10-CM | POA: Diagnosis not present

## 2022-11-17 NOTE — Therapy (Signed)
OUTPATIENT PHYSICAL THERAPY TREATMENT   Patient Name: Henry Jennings MRN: [id number] DOB:November 09, 1970, 52 y.o., male Today's Date: 11/17/2022  END OF SESSION:  PT End of Session - 11/17/22 1106     Visit Number 11    Number of Visits 14    Date for PT Re-Evaluation 12/01/22    PT Start Time 1107   arrived late   PT Stop Time 1145    PT Time Calculation (min) 38 min    Activity Tolerance Patient tolerated treatment well    Behavior During Therapy WFL for tasks assessed/performed               Past Medical History:  Diagnosis Date   Depression    Herniated disc, cervical    Past Surgical History:  Procedure Laterality Date   ANTERIOR CERVICAL DECOMP/DISCECTOMY FUSION N/A 08/02/2021   Procedure: C4-5, C5-6, C6-7, C7-T1 ANTERIOR CERVICAL DISCECTOMY AND FUSION WITH PLATES AND SCREWS;  Surgeon: Kerrin Champagne, MD;  Location: [facility] OR;  Service: Orthopedics;  Laterality: N/A;   colonscopy  2021   WISDOM TOOTH EXTRACTION Left 2020   Left lower   Patient Active Problem List   Diagnosis Date Noted   Spinal stenosis of cervical region    Cervical disc herniation    Other spondylosis with myelopathy, cervical region    S/P cervical spinal fusion 08/02/2021   Anxiety 06/06/2021   Bipolar disorder (HCC) 06/06/2021   Chronic fatigue syndrome 06/06/2021   Fibromyalgia 06/06/2021   Constipation 06/06/2021   Insomnia 06/06/2021   Panic disorder 06/06/2021   Gait abnormality 06/06/2021   Neck pain 06/06/2021    REFERRING PROVIDER: Erick Colace, MD   REFERRING DIAG: M79.7 (ICD-10-CM) - Fibromyalgia   Rationale for Evaluation and Treatment: Rehabilitation  THERAPY DIAG:  Cervicalgia  Cramp and spasm  Muscle weakness (generalized)  ONSET DATE: 2018   SUBJECTIVE:                                                                                                                                                                                           SUBJECTIVE  STATEMENT: Pt reports most pain is between shoulder blades and under. He reports this pain comes and goes. 7/10 pain level at entry. States he feels his strength improved with aquatic PT, though his endurance remains limited.   FROM EVAL: Increasing issues since 2018. Went to PT and it seemed to help. Seemed ot get worse over the pandemic. Went to rheumatology due to other symptoms happening. No dx came from those visits. In 2022 was dx with fibromyalgia. ACDF Aug 2023. It took a lot longer to  heal from that than I expected. From 2021 and on I was dropping things and things were too heavy but a lot of it has gone away.  After too much lifting or 10+lb and arms will give. Most nerves are fine but there was one on both side that were limited in NCV testing.  Skin hypersensitivity that can be irritated by certain clothes.    PERTINENT HISTORY:  H/o migraines-sweatband and rams horn pattern  PAIN:  Are you having pain? Yes: NPRS scale: 7/10 Pain location: generalized Pain description: achy, sharp with certain motions Aggravating factors: never know Relieving factors: hot shower, CBD, rotate hot and cold, massage  PRECAUTIONS:  None  RED FLAGS: None   WEIGHT BEARING RESTRICTIONS:  No  FALLS:  Has patient fallen in last 6 months?  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    OBJECTIVE:   DIAGNOSTIC FINDINGS:  X-ray of the cervical spine taken on 08/17/2022 was independently reviewed  and interpreted, showing C4-7 ACDF.  Allografts are in appropriate position. Anterior plate and screws are in good position. There is no lucency around the screws or the interbody cages. No fracture or dislocation seen.  From MD note: His EMG does not show any signs of neuropathy or ulnar neuropathy. He does  have decreased recruitment in tricep muscles bilaterally but no significant tricep weakness.  PATIENT SURVEYS:  UEFS eval: 46  COGNITIVE STATUS: Within functional limits for tasks assessed    POSTURE:  increased lumbar lordosis  HAND DOMINANCE:  Right   Body Part #1 Cervical  PALPATION: Eval: more spasm noted in Lt upper traps  UE MMT UPPER EXTREMITY MMT:  MMT Right eval Left eval  Shoulder flexion 12.8 12.4  Shoulder extension    Shoulder abduction    Shoulder adduction    Shoulder extension    Shoulder internal rotation    Shoulder external rotation 11.1 7.7  Middle trapezius    Lower trapezius    Elbow flexion    Elbow extension     (Blank rows = not tested)       Body Part #2 Hip    LE MMT LOWER EXTREMITY MMT:    MMT Right eval Left eval R /L 10/24  Hip flexion 19.5 19.8 43.9 / 42.6  Hip extension     Hip abduction 23.2 26.5 29.0 / 27.9  Hip adduction     Hip internal rotation     Hip external rotation     Knee flexion     Knee extension 26.4 31.3 43.2 / 43.9  Ankle dorsiflexion     Ankle plantarflexion     Ankle inversion     Ankle eversion      (Blank rows = not tested)     FUNCTIONAL TESTS:  EVAL: 30 seconds chair stand test 5;    places right foot under chair to press up, does not use UEs   10/26/22: 30s sts: 9  TREATMENT:  11/15: Supine wand flexion with 2# bar 2x10 (pain free range) Supine active shoulder flexion 1# x10 ea Supine press up 2# x10, increased to 4# x10R, x7L Supine horizontal abd RTB x10 Supine bilateral ER RTB x10 (stopped due to pain) Wall angel x5 (stopped due to pain) Bent over row 4# x10ea Serratus raise with RTB (partial shoulder flexion) 2x10 Bicep curls 4# R 2x10; 2# L (attempted, but painful) Reverse wall push up x8 Theraband row/ext GTB 2x10ea        PATIENT  EDUCATION:  Education details:exercise form/rationale Person educated: Patient Education method: Programmer, multimedia, Demonstration, Tactile cues, and Verbal cues Education comprehension: verbalized understanding, returned demonstration, verbal cues required, tactile cues required, and needs further education  HOME EXERCISE PROGRAM: Walking program  Research counselor/psych with chronic pain/fibromyalgia specialty  Aquatic This aquatic home exercise program from [employer] utilizes pictures from land based exercises, but has been adapted prior to lamination and issuance.   Access Code: [email or web address] URL: [email or web address] Date: 10/24/2022 Prepared by: Geni Bers  Exercises - Hand Buoy Carry  - Seated Straddle on Flotation Forward Breast Stroke Arms and Bicycle Legs  - Side lunge with hand buoys  - Push-Up on Pool Wall  - 1 x daily - 1-3 x weekly - 1-3 sets - 10 reps - Hip Hiking on Step  - 1 x daily - 1-3 x weekly - 1-3 sets - 10 reps - Standing Hip Hinge  - 1 x daily - 1-3 x weekly - 1-2 sets - 10 reps - Noodle Stomp  - 1 x daily - 1-3 x weekly - 1-2 sets - 10 reps - Noodle press  - 1 x daily - 1-3 x weekly - 1-2 sets - 10 reps - Standing Shoulder Horizontal Abduction with Resistance  - 1 x daily - 1-3 x weekly - 1-2 sets - 10 reps   ASSESSMENT:  CLINICAL IMPRESSION: Pt with minimal discomfort with exercise. No pain with resisted H abd, though he did have pain in scapular area with bilateral ER. Fatigued quickly with supine active flexion. Pt required cues for scap mechanics with bent over rows.  Reported resisted rows were easy at first, but became challenging. Pt unable to complete bicep curls with L UE due to c/o acute pain. Trialed wrist extensor stretch to see if this alleviated pain, but no change. Pt is planning to get membership at [facility] to utilize pool and gym. Will assess response to exercise next visit and provide land based HEP.     Re-cert/PN: Pt reports  he has improved his sitting postures while sitting and working  and along with the aquatic PT has decreased overall pain, with higher or max pain sensitivity less frequent. He has had only a few decreases in pain to 2-3/10 with ~5/10 being the average.  He has increased his walking (program) to 3 x daily 1/2 mile which appears to have some relation to his recent slight increase in pain. He is advised to complete just 3 x per week rather than daily.  As in chart above he has had a significant improvement in LE strength.  He has had good tolerance to aquatic intervention and has reached his max potential in setting.  He will return for 1 more visit to ensure indep with aquatic final HEP then return to land intervention for remainder of this certification to progress toward lifting and UE strength goals and add more focused attention to cervicalgia and shoulder discomfort.       REHAB POTENTIAL: Fair  diagnosis  CLINICAL DECISION MAKING: Evolving/moderate complexity  EVALUATION COMPLEXITY: Low   GOALS: Goals reviewed with patient? Yes  SHORT TERM GOALS: Target date: 9/14  Pt will be performing walking program on a daily basis with understanding of rest breaks/modifications necessary Baseline: Goal status: Met 09/27/22    LONG TERM GOALS: Target date:12/01/22  UEFI to improve by MDC Baseline:  Goal status: ongoing 10/26/22  2.  Each MMT via hand held dynamometry to increase by 10+lb Baseline:  Goal status: ongoing 10/26/22  3.  Able to lift objects <10lb throughout his day without dropping them or arms "giving out" Baseline:  Goal status: Partially met (LE) 10/26/22  4.  Pt will verbalize increased tolerance to work activities and postures necessary Baseline:  Goal status: Met 10/26/22     PLAN:  PT FREQUENCY: 1-2x/week  PT DURATION: 5 weeks  PLANNED INTERVENTIONS: Therapeutic exercises, Therapeutic activity, Neuromuscular re-education, Balance training, Gait training,  Patient/Family education, Self Care, Joint mobilization, Stair training, Aquatic Therapy, Dry Needling, Spinal mobilization, Cryotherapy, Moist heat, Taping, Ionotophoresis 4mg /ml Dexamethasone, Manual therapy, and Re-evaluation.  PLAN FOR NEXT SESSION: Return land based intervention for shoulder and cervical intervention: consider DN, strengthening and ROM.  Be mindful that pt tends towards hypermobility  Riki Altes, PTA  11/17/22 1:45 PM [facility] [address] [place], [place], [address] Phone: [phone]   Fax:  [phone]

## 2022-11-20 DIAGNOSIS — F331 Major depressive disorder, recurrent, moderate: Secondary | ICD-10-CM | POA: Diagnosis not present

## 2022-11-20 DIAGNOSIS — F411 Generalized anxiety disorder: Secondary | ICD-10-CM | POA: Diagnosis not present

## 2022-11-20 DIAGNOSIS — F902 Attention-deficit hyperactivity disorder, combined type: Secondary | ICD-10-CM | POA: Diagnosis not present

## 2022-11-21 ENCOUNTER — Encounter (HOSPITAL_BASED_OUTPATIENT_CLINIC_OR_DEPARTMENT_OTHER): Payer: Self-pay | Admitting: Physical Therapy

## 2022-11-21 ENCOUNTER — Ambulatory Visit (HOSPITAL_BASED_OUTPATIENT_CLINIC_OR_DEPARTMENT_OTHER): Payer: BC Managed Care – PPO | Admitting: Physical Therapy

## 2022-11-21 DIAGNOSIS — R252 Cramp and spasm: Secondary | ICD-10-CM

## 2022-11-21 DIAGNOSIS — M6281 Muscle weakness (generalized): Secondary | ICD-10-CM

## 2022-11-21 DIAGNOSIS — M542 Cervicalgia: Secondary | ICD-10-CM

## 2022-11-21 NOTE — Therapy (Signed)
OUTPATIENT PHYSICAL THERAPY TREATMENT   Patient Name: Henry Jennings MRN: [id number] DOB:November 19, 1970, 52 y.o., male Today's Date: 11/21/2022  END OF SESSION:  PT End of Session - 11/21/22 1106     Visit Number 12    Number of Visits 17    Date for PT Re-Evaluation 12/30/22    PT Start Time 1105    PT Stop Time 1145    PT Time Calculation (min) 40 min    Activity Tolerance Patient tolerated treatment well    Behavior During Therapy [facility] for tasks assessed/performed               Past Medical History:  Diagnosis Date   Depression    Herniated disc, cervical    Past Surgical History:  Procedure Laterality Date   ANTERIOR CERVICAL DECOMP/DISCECTOMY FUSION N/A 08/02/2021   Procedure: C4-5, C5-6, C6-7, C7-T1 ANTERIOR CERVICAL DISCECTOMY AND FUSION WITH PLATES AND SCREWS;  Surgeon: Kerrin Champagne, MD;  Location: [facility] OR;  Service: Orthopedics;  Laterality: N/A;   colonscopy  2021   WISDOM TOOTH EXTRACTION Left 2020   Left lower   Patient Active Problem List   Diagnosis Date Noted   Spinal stenosis of cervical region    Cervical disc herniation    Other spondylosis with myelopathy, cervical region    S/P cervical spinal fusion 08/02/2021   Anxiety 06/06/2021   Bipolar disorder (HCC) 06/06/2021   Chronic fatigue syndrome 06/06/2021   Fibromyalgia 06/06/2021   Constipation 06/06/2021   Insomnia 06/06/2021   Panic disorder 06/06/2021   Gait abnormality 06/06/2021   Neck pain 06/06/2021    REFERRING PROVIDER: Erick Colace, MD   REFERRING DIAG: M79.7 (ICD-10-CM) - Fibromyalgia   Rationale for Evaluation and Treatment: Rehabilitation  THERAPY DIAG:  Cervicalgia  Cramp and spasm  Muscle weakness (generalized)  ONSET DATE: 2018   SUBJECTIVE:                                                                                                                                                                                           SUBJECTIVE STATEMENT: Aquatic  seemed to help with strength and endurance, If I do too much, I feel really weak.   FROM EVAL: Increasing issues since 2018. Went to PT and it seemed to help. Seemed ot get worse over the pandemic. Went to rheumatology due to other symptoms happening. No dx came from those visits. In 2022 was dx with fibromyalgia. ACDF Aug 2023. It took a lot longer to heal from that than I expected. From 2021 and on I was dropping things and things were too heavy but a lot  of it has gone away.  After too much lifting or 10+lb and arms will give. Most nerves are fine but there was one on both side that were limited in NCV testing.  Skin hypersensitivity that can be irritated by certain clothes.    PERTINENT HISTORY:  H/o migraines-sweatband and rams horn pattern  PAIN:  Are you having pain? Yes: NPRS scale: 7/10 Pain location: generalized Pain description: achy, sharp with certain motions Aggravating factors: never know Relieving factors: hot shower, CBD, rotate hot and cold, massage  PRECAUTIONS:  None  RED FLAGS: None   WEIGHT BEARING RESTRICTIONS:  No  FALLS:  Has patient fallen in last 6 months?  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    OBJECTIVE:   DIAGNOSTIC FINDINGS:  X-ray of the cervical spine taken on 08/17/2022 was independently reviewed  and interpreted, showing C4-7 ACDF.  Allografts are in appropriate position. Anterior plate and screws are in good position. There is no lucency around the screws or the interbody cages. No fracture or dislocation seen.  From MD note: His EMG does not show any signs of neuropathy or ulnar neuropathy. He does have decreased recruitment in tricep muscles bilaterally but no significant tricep weakness.  PATIENT SURVEYS:  UEFS eval: 46  COGNITIVE  STATUS: Within functional limits for tasks assessed    POSTURE:  increased lumbar lordosis  HAND DOMINANCE:  Right   Body Part #1 Cervical  PALPATION: Eval: more spasm noted in Lt upper traps  UE MMT UPPER EXTREMITY MMT:  MMT (lb) Right eval Left eval Rt/Lt 11/19  Shoulder flexion 12.8 12.4 16.9/14.1  Shoulder extension     Shoulder abduction     Shoulder adduction     Shoulder extension     Shoulder internal rotation     Shoulder external rotation 11.1 7.7 7.3/11.6  Middle trapezius     Lower trapezius     Elbow flexion     Elbow extension      (Blank rows = not tested)       Body Part #2 Hip    LE MMT LOWER EXTREMITY MMT:    MMT Right eval Left eval R /L 10/24  Hip flexion 19.5 19.8 43.9 / 42.6  Hip extension     Hip abduction 23.2 26.5 29.0 / 27.9  Hip adduction     Hip internal rotation     Hip external rotation     Knee flexion     Knee extension 26.4 31.3 43.2 / 43.9  Ankle dorsiflexion     Ankle plantarflexion     Ankle inversion     Ankle eversion      (Blank rows = not tested)     FUNCTIONAL TESTS:  EVAL: 30 seconds chair stand test 5;    places right foot under chair to press up, does not use UEs   10/26/22: 30s sts: 9  TREATMENT:                                                                                                                               .  Treatment                            11/19:  Trigger Point Dry Needling, Manual Therapy Treatment:  Initial or subsequent education regarding Trigger Point Dry Needling: Subsequent- had DN previously Did patient give consent to treatment with Trigger Point Dry Needling: Yes TPDN with skilled palpation and monitoring followed by STM to the following muscles: bilateral upper traps, Rt levator scap, C3 bilat paraspinals  STM levator scap with bilateral rib mobs, bilat first rib depression Seated first rib mob with sheet Door pec stretch Cervical rotation Discussed walking  program- 2 harder days and then 1 easier day   11/15: Supine wand flexion with 2# bar 2x10 (pain free range) Supine active shoulder flexion 1# x10 ea Supine press up 2# x10, increased to 4# x10R, x7L Supine horizontal abd RTB x10 Supine bilateral ER RTB x10 (stopped due to pain) Wall angel x5 (stopped due to pain) Bent over row 4# x10ea Serratus raise with RTB (partial shoulder flexion) 2x10 Bicep curls 4# R 2x10; 2# L (attempted, but painful) Reverse wall push up x8 Theraband row/ext GTB 2x10ea        PATIENT EDUCATION:  Education details:exercise form/rationale Person educated: Patient Education method: Programmer, multimedia, Demonstration, Tactile cues, and Verbal cues Education comprehension: verbalized understanding, returned demonstration, verbal cues required, tactile cues required, and needs further education  HOME EXERCISE PROGRAM: Walking program  Research counselor/psych with chronic pain/fibromyalgia specialty  Aquatic This aquatic home exercise program from [employer] utilizes pictures from land based exercises, but has been adapted prior to lamination and issuance.   Access Code: [email or web address] URL: [email or web address] Date: 10/24/2022 Prepared by: Geni Bers  Exercises - Hand Buoy Carry  - Seated Straddle on Flotation Forward Breast Stroke Arms and Bicycle Legs  - Side lunge with hand buoys  - Push-Up on Pool Wall  - 1 x daily - 1-3 x weekly - 1-3 sets - 10 reps - Hip Hiking on Step  - 1 x daily - 1-3 x weekly - 1-3 sets - 10 reps - Standing Hip Hinge  - 1 x daily - 1-3 x weekly - 1-2 sets - 10 reps - Noodle Stomp  - 1 x daily - 1-3 x weekly - 1-2 sets - 10 reps - Noodle press  - 1 x daily - 1-3 x weekly - 1-2 sets - 10 reps - Standing Shoulder Horizontal Abduction with Resistance  - 1 x daily - 1-3 x weekly - 1-2 sets - 10 reps   ASSESSMENT:  CLINICAL IMPRESSION: Excellent tolerance to DN. Noted that he denied feeling the multiple twitches  that occurred on the left side but could feel them on the left. Will extend through the end of the year to continue working with him on cervical tension decrease and monitor through endurance walking progression.    Re-cert/PN: Pt reports he has improved his sitting postures while sitting and working  and along with the aquatic PT has decreased overall pain, with higher or max pain sensitivity less frequent. He has had only a few decreases in pain to 2-3/10 with ~5/10 being the average.  He has increased his walking (program) to 3 x daily 1/2 mile which appears to have some relation to his recent slight increase in pain. He is advised to complete just 3 x per week rather than daily.  As in chart above he has had a significant improvement in LE strength.  He has  had good tolerance to aquatic intervention and has reached his max potential in setting.  He will return for 1 more visit to ensure indep with aquatic final HEP then return to land intervention for remainder of this certification to progress toward lifting and UE strength goals and add more focused attention to cervicalgia and shoulder discomfort.       REHAB POTENTIAL: Fair diagnosis  CLINICAL DECISION MAKING: Evolving/moderate complexity  EVALUATION COMPLEXITY: Low   GOALS: Goals reviewed with patient? Yes  SHORT TERM GOALS: Target date: 9/14  Pt will be performing walking program on a daily basis with understanding of rest breaks/modifications necessary Baseline: Goal status: Met 09/27/22    LONG TERM GOALS: Target date:POC DATE  UEFI to improve by MDC Baseline:  Goal status: ongoing 10/26/22  2.  Each MMT via hand held dynamometry to increase by 10+lb Baseline:  Goal status: ongoing 10/26/22  3.  Able to lift objects <10lb throughout his day without dropping them or arms "giving out" Baseline:  Goal status:achieved  4.  Pt will verbalize increased tolerance to work activities and postures necessary Baseline:   Goal status: Met 10/26/22     PLAN:  PT FREQUENCY: 1-2x/week  PT DURATION: 5 weeks  PLANNED INTERVENTIONS: Therapeutic exercises, Therapeutic activity, Neuromuscular re-education, Balance training, Gait training, Patient/Family education, Self Care, Joint mobilization, Stair training, Aquatic Therapy, Dry Needling, Spinal mobilization, Cryotherapy, Moist heat, Taping, Ionotophoresis 4mg /ml Dexamethasone, Manual therapy, and Re-evaluation.  PLAN FOR NEXT SESSION: retest UEFI  Henry Jennings C. Midori Dado PT, DPT 11/21/22 12:55 PM  [facility] [address] [place], [place], [address] Phone: [phone]   Fax:  [phone]

## 2022-11-28 ENCOUNTER — Ambulatory Visit (HOSPITAL_BASED_OUTPATIENT_CLINIC_OR_DEPARTMENT_OTHER): Payer: BC Managed Care – PPO | Admitting: Physical Therapy

## 2022-11-28 ENCOUNTER — Encounter (HOSPITAL_BASED_OUTPATIENT_CLINIC_OR_DEPARTMENT_OTHER): Payer: Self-pay | Admitting: Physical Therapy

## 2022-11-28 DIAGNOSIS — M6281 Muscle weakness (generalized): Secondary | ICD-10-CM | POA: Diagnosis not present

## 2022-11-28 DIAGNOSIS — R252 Cramp and spasm: Secondary | ICD-10-CM | POA: Diagnosis not present

## 2022-11-28 DIAGNOSIS — M542 Cervicalgia: Secondary | ICD-10-CM

## 2022-11-28 NOTE — Therapy (Signed)
OUTPATIENT PHYSICAL THERAPY TREATMENT   Patient Name: Henry Jennings MRN: [id number] DOB:August 01, 1970, 52 y.o., male Today's Date: 11/28/2022  END OF SESSION:  PT End of Session - 11/28/22 1112     Visit Number 13    Number of Visits 17    Date for PT Re-Evaluation 12/30/22    PT Start Time 1111    PT Stop Time 1144    PT Time Calculation (min) 33 min    Activity Tolerance Patient tolerated treatment well    Behavior During Therapy WFL for tasks assessed/performed                Past Medical History:  Diagnosis Date   Depression    Herniated disc, cervical    Past Surgical History:  Procedure Laterality Date   ANTERIOR CERVICAL DECOMP/DISCECTOMY FUSION N/A 08/02/2021   Procedure: C4-5, C5-6, C6-7, C7-T1 ANTERIOR CERVICAL DISCECTOMY AND FUSION WITH PLATES AND SCREWS;  Surgeon: Kerrin Champagne, MD;  Location: [facility] OR;  Service: Orthopedics;  Laterality: N/A;   colonscopy  2021   WISDOM TOOTH EXTRACTION Left 2020   Left lower   Patient Active Problem List   Diagnosis Date Noted   Spinal stenosis of cervical region    Cervical disc herniation    Other spondylosis with myelopathy, cervical region    S/P cervical spinal fusion 08/02/2021   Anxiety 06/06/2021   Bipolar disorder (HCC) 06/06/2021   Chronic fatigue syndrome 06/06/2021   Fibromyalgia 06/06/2021   Constipation 06/06/2021   Insomnia 06/06/2021   Panic disorder 06/06/2021   Gait abnormality 06/06/2021   Neck pain 06/06/2021    REFERRING PROVIDER: Erick Colace, MD   REFERRING DIAG: M79.7 (ICD-10-CM) - Fibromyalgia   Rationale for Evaluation and Treatment: Rehabilitation  THERAPY DIAG:  Cervicalgia  Cramp and spasm  ONSET DATE: 2018   SUBJECTIVE:                                                                                                                                                                                           SUBJECTIVE STATEMENT: Lower arms and legs are sore. Was sore  the day after DN but then felt better for a bit- limitations returned yesterday.   FROM EVAL: Increasing issues since 2018. Went to PT and it seemed to help. Seemed ot get worse over the pandemic. Went to rheumatology due to other symptoms happening. No dx came from those visits. In 2022 was dx with fibromyalgia. ACDF Aug 2023. It took a lot longer to heal from that than I expected. From 2021 and on I was dropping things and things were too heavy but  a lot of it has gone away.  After too much lifting or 10+lb and arms will give. Most nerves are fine but there was one on both side that were limited in NCV testing.  Skin hypersensitivity that can be irritated by certain clothes.    PERTINENT HISTORY:  H/o migraines-sweatband and rams horn pattern  PAIN:  Are you having pain? Yes: NPRS scale: 7/10 Pain location: generalized Pain description: achy, sharp with certain motions Aggravating factors: never know Relieving factors: hot shower, CBD, rotate hot and cold, massage  PRECAUTIONS:  None  RED FLAGS: None   WEIGHT BEARING RESTRICTIONS:  No  FALLS:  Has patient fallen in last 6 months?  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    OBJECTIVE:   DIAGNOSTIC FINDINGS:  X-ray of the cervical spine taken on 08/17/2022 was independently reviewed  and interpreted, showing C4-7 ACDF.  Allografts are in appropriate position. Anterior plate and screws are in good position. There is no lucency around the screws or the interbody cages. No fracture or dislocation seen.  From MD note: His EMG does not show any signs of neuropathy or ulnar neuropathy. He does have decreased recruitment in tricep muscles bilaterally but no significant tricep weakness.  PATIENT SURVEYS:  UEFS eval: 46  COGNITIVE  STATUS: Within functional limits for tasks assessed    POSTURE:  increased lumbar lordosis  HAND DOMINANCE:  Right   Body Part #1 Cervical  PALPATION: Eval: more spasm noted in Lt upper traps  UE MMT UPPER EXTREMITY MMT:  MMT (lb) Right eval Left eval Rt/Lt 11/19  Shoulder flexion 12.8 12.4 16.9/14.1  Shoulder extension     Shoulder abduction     Shoulder adduction     Shoulder extension     Shoulder internal rotation     Shoulder external rotation 11.1 7.7 7.3/11.6  Middle trapezius     Lower trapezius     Elbow flexion     Elbow extension      (Blank rows = not tested)       Body Part #2 Hip    LE MMT LOWER EXTREMITY MMT:    MMT Right eval Left eval R /L 10/24  Hip flexion 19.5 19.8 43.9 / 42.6  Hip extension     Hip abduction 23.2 26.5 29.0 / 27.9  Hip adduction     Hip internal rotation     Hip external rotation     Knee flexion     Knee extension 26.4 31.3 43.2 / 43.9  Ankle dorsiflexion     Ankle plantarflexion     Ankle inversion     Ankle eversion      (Blank rows = not tested)     FUNCTIONAL TESTS:  EVAL: 30 seconds chair stand test 5;    places right foot under chair to press up, does not use UEs   10/26/22: 30s sts: 9  TREATMENT:  Treatment                            11/26:  Trigger Point Dry Needling, Manual Therapy Treatment:  Initial or subsequent education regarding Trigger Point Dry Needling: Subsequent Did patient give consent to treatment with Trigger Point Dry Needling: Yes TPDN with skilled palpation and monitoring followed by STM to the following muscles: bilateral upper traps, C4 bil paraspinals  Prone rib mobs, bil first rib depression Prone scap retraction; retraction + extension Ube retro 3 min Standing L stretch at bar Theraputty manipulation to large range through  shoulders   .Treatment                            11/19:  Trigger Point Dry Needling, Manual Therapy Treatment:  Initial or subsequent education regarding Trigger Point Dry Needling: Subsequent- had DN previously Did patient give consent to treatment with Trigger Point Dry Needling: Yes TPDN with skilled palpation and monitoring followed by STM to the following muscles: bilateral upper traps, Rt levator scap, C3 bilat paraspinals  STM levator scap with bilateral rib mobs, bilat first rib depression Seated first rib mob with sheet Door pec stretch Cervical rotation Discussed walking program- 2 harder days and then 1 easier day   11/15: Supine wand flexion with 2# bar 2x10 (pain free range) Supine active shoulder flexion 1# x10 ea Supine press up 2# x10, increased to 4# x10R, x7L Supine horizontal abd RTB x10 Supine bilateral ER RTB x10 (stopped due to pain) Wall angel x5 (stopped due to pain) Bent over row 4# x10ea Serratus raise with RTB (partial shoulder flexion) 2x10 Bicep curls 4# R 2x10; 2# L (attempted, but painful) Reverse wall push up x8 Theraband row/ext GTB 2x10ea    PATIENT EDUCATION:  Education details:exercise form/rationale Person educated: Patient Education method: Programmer, multimedia, Demonstration, Tactile cues, and Verbal cues Education comprehension: verbalized understanding, returned demonstration, verbal cues required, tactile cues required, and needs further education  HOME EXERCISE PROGRAM: Walking program  Research counselor/psych with chronic pain/fibromyalgia specialty  Aquatic This aquatic home exercise program from [employer] utilizes pictures from land based exercises, but has been adapted prior to lamination and issuance.   Access Code: [email or web address] URL: [email or web address] Date: 10/24/2022 Prepared by: Geni Bers  Exercises - Hand Buoy Carry  - Seated Straddle on Flotation Forward Breast Stroke Arms and Bicycle Legs  - Side  lunge with hand buoys  - Push-Up on Pool Wall  - 1 x daily - 1-3 x weekly - 1-3 sets - 10 reps - Hip Hiking on Step  - 1 x daily - 1-3 x weekly - 1-3 sets - 10 reps - Standing Hip Hinge  - 1 x daily - 1-3 x weekly - 1-2 sets - 10 reps - Noodle Stomp  - 1 x daily - 1-3 x weekly - 1-2 sets - 10 reps - Noodle press  - 1 x daily - 1-3 x weekly - 1-2 sets - 10 reps - Standing Shoulder Horizontal Abduction with Resistance  - 1 x daily - 1-3 x weekly - 1-2 sets - 10 reps   ASSESSMENT:  CLINICAL IMPRESSION: Notable extension when performing Rt cervical rottaion, suspect incision along Lt SCM is reducing muscular activation- asked pt to add slight flexion with rotation to open facets and will re-evaluate incision site for mobiliy at next visit.    Re-cert/PN: Pt reports he has improved his sitting  postures while sitting and working  and along with the aquatic PT has decreased overall pain, with higher or max pain sensitivity less frequent. He has had only a few decreases in pain to 2-3/10 with ~5/10 being the average.  He has increased his walking (program) to 3 x daily 1/2 mile which appears to have some relation to his recent slight increase in pain. He is advised to complete just 3 x per week rather than daily.  As in chart above he has had a significant improvement in LE strength.  He has had good tolerance to aquatic intervention and has reached his max potential in setting.  He will return for 1 more visit to ensure indep with aquatic final HEP then return to land intervention for remainder of this certification to progress toward lifting and UE strength goals and add more focused attention to cervicalgia and shoulder discomfort.       REHAB POTENTIAL: Fair diagnosis  CLINICAL DECISION MAKING: Evolving/moderate complexity  EVALUATION COMPLEXITY: Low   GOALS: Goals reviewed with patient? Yes  SHORT TERM GOALS: Target date: 9/14  Pt will be performing walking program on a daily basis  with understanding of rest breaks/modifications necessary Baseline: Goal status: Met 09/27/22    LONG TERM GOALS: Target date:POC DATE  UEFI to improve by MDC Baseline:  Goal status: ongoing 10/26/22  2.  Each MMT via hand held dynamometry to increase by 10+lb Baseline:  Goal status: ongoing 10/26/22  3.  Able to lift objects <10lb throughout his day without dropping them or arms "giving out" Baseline:  Goal status:achieved  4.  Pt will verbalize increased tolerance to work activities and postures necessary Baseline:  Goal status: Met 10/26/22     PLAN:  PT FREQUENCY: 1-2x/week  PT DURATION: 5 weeks  PLANNED INTERVENTIONS: Therapeutic exercises, Therapeutic activity, Neuromuscular re-education, Balance training, Gait training, Patient/Family education, Self Care, Joint mobilization, Stair training, Aquatic Therapy, Dry Needling, Spinal mobilization, Cryotherapy, Moist heat, Taping, Ionotophoresis 4mg /ml Dexamethasone, Manual therapy, and Re-evaluation.  PLAN FOR NEXT SESSION: retest UEFI  Tevon Berhane C. Reann Dobias PT, DPT 11/28/22 11:45 AM  [facility] [address] [place], [place], [address] Phone: [phone]   Fax:  [phone]

## 2022-12-04 ENCOUNTER — Ambulatory Visit (HOSPITAL_BASED_OUTPATIENT_CLINIC_OR_DEPARTMENT_OTHER): Payer: BC Managed Care – PPO | Attending: Physical Medicine & Rehabilitation | Admitting: Physical Therapy

## 2022-12-04 ENCOUNTER — Telehealth (HOSPITAL_BASED_OUTPATIENT_CLINIC_OR_DEPARTMENT_OTHER): Payer: Self-pay | Admitting: Physical Therapy

## 2022-12-04 DIAGNOSIS — F331 Major depressive disorder, recurrent, moderate: Secondary | ICD-10-CM | POA: Diagnosis not present

## 2022-12-04 DIAGNOSIS — F411 Generalized anxiety disorder: Secondary | ICD-10-CM | POA: Diagnosis not present

## 2022-12-04 DIAGNOSIS — F902 Attention-deficit hyperactivity disorder, combined type: Secondary | ICD-10-CM | POA: Diagnosis not present

## 2022-12-04 DIAGNOSIS — R252 Cramp and spasm: Secondary | ICD-10-CM | POA: Insufficient documentation

## 2022-12-04 DIAGNOSIS — M6281 Muscle weakness (generalized): Secondary | ICD-10-CM | POA: Insufficient documentation

## 2022-12-04 DIAGNOSIS — M542 Cervicalgia: Secondary | ICD-10-CM | POA: Insufficient documentation

## 2022-12-04 NOTE — Telephone Encounter (Signed)
Spoke with Mom, Annell Greening. She reports he is currently in another appointment, has had really bad pain. She will talk to him and he will likely reach out.  Kellee Sittner C. Maisy Newport PT, DPT 12/04/22 4:42 PM

## 2022-12-13 ENCOUNTER — Ambulatory Visit (HOSPITAL_BASED_OUTPATIENT_CLINIC_OR_DEPARTMENT_OTHER): Payer: BC Managed Care – PPO | Admitting: Physical Therapy

## 2022-12-13 DIAGNOSIS — F902 Attention-deficit hyperactivity disorder, combined type: Secondary | ICD-10-CM | POA: Diagnosis not present

## 2022-12-13 DIAGNOSIS — F331 Major depressive disorder, recurrent, moderate: Secondary | ICD-10-CM | POA: Diagnosis not present

## 2022-12-13 DIAGNOSIS — F411 Generalized anxiety disorder: Secondary | ICD-10-CM | POA: Diagnosis not present

## 2022-12-18 DIAGNOSIS — F331 Major depressive disorder, recurrent, moderate: Secondary | ICD-10-CM | POA: Diagnosis not present

## 2022-12-18 DIAGNOSIS — F411 Generalized anxiety disorder: Secondary | ICD-10-CM | POA: Diagnosis not present

## 2022-12-18 DIAGNOSIS — F902 Attention-deficit hyperactivity disorder, combined type: Secondary | ICD-10-CM | POA: Diagnosis not present

## 2022-12-19 ENCOUNTER — Ambulatory Visit (HOSPITAL_BASED_OUTPATIENT_CLINIC_OR_DEPARTMENT_OTHER): Payer: BC Managed Care – PPO | Admitting: Physical Therapy

## 2022-12-25 ENCOUNTER — Encounter (HOSPITAL_BASED_OUTPATIENT_CLINIC_OR_DEPARTMENT_OTHER): Payer: BC Managed Care – PPO | Admitting: Physical Therapy

## 2023-01-01 ENCOUNTER — Encounter (HOSPITAL_BASED_OUTPATIENT_CLINIC_OR_DEPARTMENT_OTHER): Payer: BC Managed Care – PPO | Admitting: Physical Therapy

## 2023-01-08 DIAGNOSIS — F902 Attention-deficit hyperactivity disorder, combined type: Secondary | ICD-10-CM | POA: Diagnosis not present

## 2023-01-08 DIAGNOSIS — F411 Generalized anxiety disorder: Secondary | ICD-10-CM | POA: Diagnosis not present

## 2023-01-08 DIAGNOSIS — F331 Major depressive disorder, recurrent, moderate: Secondary | ICD-10-CM | POA: Diagnosis not present

## 2023-01-10 ENCOUNTER — Ambulatory Visit (HOSPITAL_BASED_OUTPATIENT_CLINIC_OR_DEPARTMENT_OTHER): Payer: BC Managed Care – PPO | Attending: Physical Medicine & Rehabilitation | Admitting: Physical Therapy

## 2023-01-10 ENCOUNTER — Encounter (HOSPITAL_BASED_OUTPATIENT_CLINIC_OR_DEPARTMENT_OTHER): Payer: Self-pay | Admitting: Physical Therapy

## 2023-01-10 DIAGNOSIS — M542 Cervicalgia: Secondary | ICD-10-CM | POA: Diagnosis not present

## 2023-01-10 DIAGNOSIS — M6281 Muscle weakness (generalized): Secondary | ICD-10-CM | POA: Diagnosis not present

## 2023-01-10 DIAGNOSIS — R252 Cramp and spasm: Secondary | ICD-10-CM | POA: Diagnosis not present

## 2023-01-10 NOTE — Therapy (Signed)
 OUTPATIENT PHYSICAL THERAPY TREATMENT   Patient Name: Henry Jennings MRN: [id number] DOB:[birth year], 53 y.o., male Today's Date: 01/10/2023  END OF SESSION:  PT End of Session - 01/10/23 1331     Visit Number 14    Number of Visits 22    Date for PT Re-Evaluation 03/07/23    PT Start Time 1325   late   PT Stop Time 1355    PT Time Calculation (min) 30 min    Activity Tolerance Patient tolerated treatment well    Behavior During Therapy [facility] for tasks assessed/performed                 Past Medical History:  Diagnosis Date   Depression    Herniated disc, cervical    Past Surgical History:  Procedure Laterality Date   ANTERIOR CERVICAL DECOMP/DISCECTOMY FUSION N/A 08/02/2021   Procedure: C4-5, C5-6, C6-7, C7-T1 ANTERIOR CERVICAL DISCECTOMY AND FUSION WITH PLATES AND SCREWS;  Surgeon: Lucilla Lynwood BRAVO, MD;  Location: [facility] OR;  Service: Orthopedics;  Laterality: N/A;   colonscopy  2021   WISDOM TOOTH EXTRACTION Left 2020   Left lower   Patient Active Problem List   Diagnosis Date Noted   Spinal stenosis of cervical region    Cervical disc herniation    Other spondylosis with myelopathy, cervical region    S/P cervical spinal fusion 08/02/2021   Anxiety 06/06/2021   Bipolar disorder (HCC) 06/06/2021   Chronic fatigue syndrome 06/06/2021   Fibromyalgia 06/06/2021   Constipation 06/06/2021   Insomnia 06/06/2021   Panic disorder 06/06/2021   Gait abnormality 06/06/2021   Neck pain 06/06/2021    REFERRING PROVIDER: Carilyn Prentice BRAVO, MD   REFERRING DIAG: M79.7 (ICD-10-CM) - Fibromyalgia   Rationale for Evaluation and Treatment: Rehabilitation  THERAPY DIAG:  Cervicalgia - Plan: PT plan of care cert/re-cert  Muscle weakness (generalized) - Plan: PT plan of care cert/re-cert  Cramp and spasm - Plan: PT plan of care cert/re-cert  ONSET DATE: 2018   SUBJECTIVE:                                                                                                                                                                                            SUBJECTIVE STATEMENT: Pt states that the pain continues into the shoulders. He is doing HEP to loosen his body but the pain will continue.   FROM EVAL: Increasing issues since 2018. Went to PT and it seemed to help. Seemed ot get worse over the pandemic. Went to rheumatology due to other symptoms happening. No dx came from those visits. In 2022 was dx with fibromyalgia. ACDF  Aug 2023. It took a lot longer to heal from that than I expected. From 2021 and on I was dropping things and things were too heavy but a lot of it has gone away.  After too much lifting or 10+lb and arms will give. Most nerves are fine but there was one on both side that were limited in NCV testing.  Skin hypersensitivity that can be irritated by certain clothes.    PERTINENT HISTORY:  H/o migraines-sweatband and rams horn pattern  PAIN:  Are you having pain? Yes: NPRS scale: 4-5/10 Pain location: generalized Pain description: achy, sharp with certain motions Aggravating factors: never know Relieving factors: hot shower, CBD, rotate hot and cold, massage  PRECAUTIONS:  None  RED FLAGS: None   WEIGHT BEARING RESTRICTIONS:  No  FALLS:  Has patient fallen in last 6 months?  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    OBJECTIVE:   DIAGNOSTIC FINDINGS:  X-ray of the cervical spine taken on 08/17/2022 was independently reviewed  and interpreted, showing C4-7 ACDF.  Allografts are in appropriate position. Anterior plate and screws are in good position. There is no lucency around the screws or the interbody cages. No fracture or dislocation seen.  From MD note: His EMG does not show any signs of neuropathy or ulnar neuropathy. He  does have decreased recruitment in tricep muscles bilaterally but no significant tricep weakness.  PATIENT SURVEYS:  UEFS eval: 46  UEFI: 49/80  COGNITIVE STATUS: Within functional limits for tasks assessed    POSTURE:  increased lumbar lordosis  HAND DOMINANCE:  Right   Body Part #1 Cervical  PALPATION: Eval: more spasm noted in Lt upper traps  UE MMT UPPER EXTREMITY MMT:   MMT (lb) Right eval Left eval Rt/Lt 11/19  Shoulder flexion 12.8 12.4 16.9/14.1  Shoulder extension     Shoulder abduction     Shoulder adduction     Shoulder extension     Shoulder internal rotation     Shoulder external rotation 11.1 7.7 7.3/11.6  Middle trapezius     Lower trapezius     Elbow flexion     Elbow extension      (Blank rows = not tested)  1/8 Too painful into UE to test       Body Part #2 Hip    LE MMT LOWER EXTREMITY MMT:    MMT Right eval Left eval R /L 10/24  Hip flexion 19.5 19.8 43.9 / 42.6  Hip extension     Hip abduction 23.2 26.5 29.0 / 27.9  Hip adduction     Hip internal rotation     Hip external rotation     Knee flexion     Knee extension 26.4 31.3 43.2 / 43.9  Ankle dorsiflexion     Ankle plantarflexion     Ankle inversion     Ankle eversion      (Blank rows = not tested)  1/8 Too painful to test LE at today's visit    FUNCTIONAL TESTS:  EVAL: 30 seconds chair stand test 5;    places right foot under chair to press up, does not use UEs   10/26/22: 30s sts: 9    01/09/22 10 with UE   TREATMENT:  Treatment                          1/8  supine rib mobs, bil first rib depression grade II STM bilat UE and C/S paraspinals Seated scap rolls 2x10 each direction YTB horizontal ABD 3x8 Desensitization techniques Exam findings                                                                                                                              Treatment                            11/26:  Trigger Point Dry Needling, Manual  Therapy Treatment:  Initial or subsequent education regarding Trigger Point Dry Needling: Subsequent Did patient give consent to treatment with Trigger Point Dry Needling: Yes TPDN with skilled palpation and monitoring followed by STM to the following muscles: bilateral upper traps, C4 bil paraspinals  Prone rib mobs, bil first rib depression Prone scap retraction; retraction + extension Ube retro 3 min Standing L stretch at bar Theraputty manipulation to large range through shoulders   .Treatment                            11/19:  Trigger Point Dry Needling, Manual Therapy Treatment:  Initial or subsequent education regarding Trigger Point Dry Needling: Subsequent- had DN previously Did patient give consent to treatment with Trigger Point Dry Needling: Yes TPDN with skilled palpation and monitoring followed by STM to the following muscles: bilateral upper traps, Rt levator scap, C3 bilat paraspinals  STM levator scap with bilateral rib mobs, bilat first rib depression Seated first rib mob with sheet Door pec stretch Cervical rotation Discussed walking program- 2 harder days and then 1 easier day   11/15: Supine wand flexion with 2# bar 2x10 (pain free range) Supine active shoulder flexion 1# x10 ea Supine press up 2# x10, increased to 4# x10R, x7L Supine horizontal abd RTB x10 Supine bilateral ER RTB x10 (stopped due to pain) Wall angel x5 (stopped due to pain) Bent over row 4# x10ea Serratus raise with RTB (partial shoulder flexion) 2x10 Bicep curls 4# R 2x10; 2# L (attempted, but painful) Reverse wall push up x8 Theraband row/ext GTB 2x10ea    PATIENT EDUCATION:  Education details:exercise form/rationale Person educated: Patient Education method: Programmer, Multimedia, Demonstration, Tactile cues, and Verbal cues Education comprehension: verbalized understanding, returned demonstration, verbal cues required, tactile cues required, and needs further education  HOME EXERCISE  PROGRAM: Walking program  Research counselor/psych with chronic pain/fibromyalgia specialty  Aquatic This aquatic home exercise program from [employer] utilizes pictures from land based exercises, but has been adapted prior to lamination and issuance.   Access Code: [email or web address] URL: [email or web address] Date: 10/24/2022 Prepared by: Matilda Kohut  Exercises - Hand Buoy Carry  - Seated Straddle on Flotation Forward Breast Stroke Arms  and Bicycle Legs  - Side lunge with hand buoys  - Push-Up on Pool Wall  - 1 x daily - 1-3 x weekly - 1-3 sets - 10 reps - Hip Hiking on Step  - 1 x daily - 1-3 x weekly - 1-3 sets - 10 reps - Standing Hip Hinge  - 1 x daily - 1-3 x weekly - 1-2 sets - 10 reps - Noodle Stomp  - 1 x daily - 1-3 x weekly - 1-2 sets - 10 reps - Noodle press  - 1 x daily - 1-3 x weekly - 1-2 sets - 10 reps - Standing Shoulder Horizontal Abduction with Resistance  - 1 x daily - 1-3 x weekly - 1-2 sets - 10 reps   ASSESSMENT:  CLINICAL IMPRESSION:  Pt with measurable improvement in objective and subjective outcomes today. Pt continues to very limited with pain and unable to do fully testing battery with report in increase in UE discomfort with muscle contraction. Pt plan to join gym facility in order to progress with self exercise program. Pt gave verbal understanding to edu regarding desensitization and postural changes with desk work in order to improve pain. Plan for pt to continue on land to continue with primary therapist for progression of exercise program. Pt will benefit from skilled PT services to address current impairments using UE and prevent further functional decline.   Re-cert/PN: Pt reports he has improved his sitting postures while sitting and working  and along with the aquatic PT has decreased overall pain, with higher or max pain sensitivity less frequent. He has had only a few decreases in pain to 2-3/10 with ~5/10 being the average.  He has  increased his walking (program) to 3 x daily 1/2 mile which appears to have some relation to his recent slight increase in pain. He is advised to complete just 3 x per week rather than daily.  As in chart above he has had a significant improvement in LE strength.  He has had good tolerance to aquatic intervention and has reached his max potential in setting.  He will return for 1 more visit to ensure indep with aquatic final HEP then return to land intervention for remainder of this certification to progress toward lifting and UE strength goals and add more focused attention to cervicalgia and shoulder discomfort.       REHAB POTENTIAL: Fair diagnosis  CLINICAL DECISION MAKING: Evolving/moderate complexity  EVALUATION COMPLEXITY: Low   GOALS: Goals reviewed with patient? Yes  SHORT TERM GOALS: Target date: 9/14  Pt will be performing walking program on a daily basis with understanding of rest breaks/modifications necessary Baseline: Goal status: Met 09/27/22    LONG TERM GOALS: Target date:POC DATE  UEFI to improve by MDC Baseline:  Goal status: ongoing 10/26/22  2.  Each MMT via hand held dynamometry to increase by 10+lb Baseline:  Goal status: ongoing 10/26/22  3.  Able to lift objects <10lb throughout his day without dropping them or arms giving out Baseline:  Goal status:achieved  4.  Pt will verbalize increased tolerance to work activities and postures necessary Baseline:  Goal status: Met 10/26/22  5. Pt will be able to demonstrate/report ability to sit/stand/sleep for extended periods of time without pain in order to demonstrate functional improvement and tolerance to static positioning.    Goal status:INITIAL      PLAN:  PT FREQUENCY: 1-2x/week  PT DURATION: 5 weeks  PLANNED INTERVENTIONS: Therapeutic exercises, Therapeutic activity, Neuromuscular re-education, Balance training,  Gait training, Patient/Family education, Self Care, Joint mobilization,  Stair training, Aquatic Therapy, Dry Needling, Spinal mobilization, Cryotherapy, Moist heat, Taping, Ionotophoresis 4mg /ml Dexamethasone , Manual therapy, and Re-evaluation.  PLAN FOR NEXT SESSION: continue with pain management and progressive exercise    Dale Call PT, DPT 01/10/23 2:12 PM

## 2023-01-15 DIAGNOSIS — F411 Generalized anxiety disorder: Secondary | ICD-10-CM | POA: Diagnosis not present

## 2023-01-15 DIAGNOSIS — F331 Major depressive disorder, recurrent, moderate: Secondary | ICD-10-CM | POA: Diagnosis not present

## 2023-01-15 DIAGNOSIS — F902 Attention-deficit hyperactivity disorder, combined type: Secondary | ICD-10-CM | POA: Diagnosis not present

## 2023-01-19 ENCOUNTER — Ambulatory Visit (HOSPITAL_BASED_OUTPATIENT_CLINIC_OR_DEPARTMENT_OTHER): Payer: BC Managed Care – PPO

## 2023-01-19 ENCOUNTER — Encounter (HOSPITAL_BASED_OUTPATIENT_CLINIC_OR_DEPARTMENT_OTHER): Payer: Self-pay

## 2023-01-19 DIAGNOSIS — R252 Cramp and spasm: Secondary | ICD-10-CM

## 2023-01-19 DIAGNOSIS — M542 Cervicalgia: Secondary | ICD-10-CM | POA: Diagnosis not present

## 2023-01-19 DIAGNOSIS — M6281 Muscle weakness (generalized): Secondary | ICD-10-CM

## 2023-01-19 NOTE — Therapy (Signed)
OUTPATIENT PHYSICAL THERAPY TREATMENT   Patient Name: Henry Jennings MRN: [id number] DOB:July 04, 1970, 53 y.o., male Today's Date: 01/19/2023  END OF SESSION:  PT End of Session - 01/19/23 1453     Visit Number 15    Number of Visits 22    Date for PT Re-Evaluation 03/07/23    PT Start Time 1302    PT Stop Time 1345    PT Time Calculation (min) 43 min    Activity Tolerance Patient tolerated treatment well    Behavior During Therapy [facility] for tasks assessed/performed                  Past Medical History:  Diagnosis Date   Depression    Herniated disc, cervical    Past Surgical History:  Procedure Laterality Date   ANTERIOR CERVICAL DECOMP/DISCECTOMY FUSION N/A 08/02/2021   Procedure: C4-5, C5-6, C6-7, C7-T1 ANTERIOR CERVICAL DISCECTOMY AND FUSION WITH PLATES AND SCREWS;  Surgeon: Kerrin Champagne, MD;  Location: [facility] OR;  Service: Orthopedics;  Laterality: N/A;   colonscopy  2021   WISDOM TOOTH EXTRACTION Left 2020   Left lower   Patient Active Problem List   Diagnosis Date Noted   Spinal stenosis of cervical region    Cervical disc herniation    Other spondylosis with myelopathy, cervical region    S/P cervical spinal fusion 08/02/2021   Anxiety 06/06/2021   Bipolar disorder (HCC) 06/06/2021   Chronic fatigue syndrome 06/06/2021   Fibromyalgia 06/06/2021   Constipation 06/06/2021   Insomnia 06/06/2021   Panic disorder 06/06/2021   Gait abnormality 06/06/2021   Neck pain 06/06/2021    REFERRING PROVIDER: Erick Colace, MD   REFERRING DIAG: M79.7 (ICD-10-CM) - Fibromyalgia   Rationale for Evaluation and Treatment: Rehabilitation  THERAPY DIAG:  Cervicalgia  Muscle weakness (generalized)  Cramp and spasm  ONSET DATE: 2018   SUBJECTIVE:                                                                                                                                                                                           SUBJECTIVE STATEMENT: Pt  reports over the last couple of days he has been having mid and upper back tightness/pain. Also tight into neck. Notes some weakness into forearms. Some N/T early yesterday. Increased pain level to 7/10.   FROM EVAL: Increasing issues since 2018. Went to PT and it seemed to help. Seemed ot get worse over the pandemic. Went to rheumatology due to other symptoms happening. No dx came from those visits. In 2022 was dx with fibromyalgia. ACDF Aug 2023. It took a lot longer to heal  from that than I expected. From 2021 and on I was dropping things and things were too heavy but a lot of it has gone away.  After too much lifting or 10+lb and arms will give. Most nerves are fine but there was one on both side that were limited in NCV testing.  Skin hypersensitivity that can be irritated by certain clothes.    PERTINENT HISTORY:  H/o migraines-sweatband and rams horn pattern  PAIN:  Are you having pain? Yes: NPRS scale: 7/10 Pain location: generalized Pain description: achy, sharp with certain motions Aggravating factors: never know Relieving factors: hot shower, CBD, rotate hot and cold, massage  PRECAUTIONS:  None  RED FLAGS: None   WEIGHT BEARING RESTRICTIONS:  No  FALLS:  Has patient fallen in last 6 months?  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    OBJECTIVE:   DIAGNOSTIC FINDINGS:  X-ray of the cervical spine taken on 08/17/2022 was independently reviewed  and interpreted, showing C4-7 ACDF.  Allografts are in appropriate position. Anterior plate and screws are in good position. There is no lucency around the screws or the interbody cages. No fracture or dislocation seen.  From MD note: His EMG does not show any signs of neuropathy or ulnar neuropathy. He does have decreased  recruitment in tricep muscles bilaterally but no significant tricep weakness.  PATIENT SURVEYS:  UEFS eval: 46  UEFI: 49/80  COGNITIVE STATUS: Within functional limits for tasks assessed    POSTURE:  increased lumbar lordosis  HAND DOMINANCE:  Right   Body Part #1 Cervical  PALPATION: Eval: more spasm noted in Lt upper traps  UE MMT UPPER EXTREMITY MMT:   MMT (lb) Right eval Left eval Rt/Lt 11/19 Rt/Lt 1/17  Shoulder flexion 12.8 12.4 16.9/14.1 12.0/15.6  Shoulder extension      Shoulder abduction      Shoulder adduction      Shoulder extension      Shoulder internal rotation      Shoulder external rotation 11.1 7.7 7.3/11.6 19.1/16.6  Middle trapezius      Lower trapezius      Elbow flexion      Elbow extension       (Blank rows = not tested)  1/8 Too painful into UE to test       Body Part #2 Hip    LE MMT LOWER EXTREMITY MMT:    MMT Right eval Left eval R /L 10/24  Hip flexion 19.5 19.8 43.9 / 42.6  Hip extension     Hip abduction 23.2 26.5 29.0 / 27.9  Hip adduction     Hip internal rotation     Hip external rotation     Knee flexion     Knee extension 26.4 31.3 43.2 / 43.9  Ankle dorsiflexion     Ankle plantarflexion     Ankle inversion     Ankle eversion      (Blank rows = not tested)  1/8 Too painful to test LE at today's visit    FUNCTIONAL TESTS:  EVAL: 30 seconds chair stand test 5;    places right foot under chair to press up, does not use UEs   10/26/22: 30s sts: 9    01/09/22 10 with UE   TREATMENT:  Treatment                          1/17  STM to thoracic ps Instruction in theracane and use of tennis balls  Quadruped "cat" stretch x10 Quadruped thread needle stretch x5bil Supine active shoulder flexion 1# x15ea Supine rhythmic stabilization 30sec ea bil- 90deg flexion End range supine manually resisted flexion/extension PNF D2 supine x10ea   Treatment                          1/8  supine rib mobs, bil  first rib depression grade II STM bilat UE and C/S paraspinals Seated scap rolls 2x10 each direction YTB horizontal ABD 3x8 Desensitization techniques Exam findings                                                                                                                              Treatment                            11/26:  Trigger Point Dry Needling, Manual Therapy Treatment:  Initial or subsequent education regarding Trigger Point Dry Needling: Subsequent Did patient give consent to treatment with Trigger Point Dry Needling: Yes TPDN with skilled palpation and monitoring followed by STM to the following muscles: bilateral upper traps, C4 bil paraspinals  Prone rib mobs, bil first rib depression Prone scap retraction; retraction + extension Ube retro 3 min Standing L stretch at bar Theraputty manipulation to large range through shoulders   .Treatment                            11/19:  Trigger Point Dry Needling, Manual Therapy Treatment:  Initial or subsequent education regarding Trigger Point Dry Needling: Subsequent- had DN previously Did patient give consent to treatment with Trigger Point Dry Needling: Yes TPDN with skilled palpation and monitoring followed by STM to the following muscles: bilateral upper traps, Rt levator scap, C3 bilat paraspinals  STM levator scap with bilateral rib mobs, bilat first rib depression Seated first rib mob with sheet Door pec stretch Cervical rotation Discussed walking program- 2 harder days and then 1 easier day   11/15: Supine wand flexion with 2# bar 2x10 (pain free range) Supine active shoulder flexion 1# x10 ea Supine press up 2# x10, increased to 4# x10R, x7L Supine horizontal abd RTB x10 Supine bilateral ER RTB x10 (stopped due to pain) Wall angel x5 (stopped due to pain) Bent over row 4# x10ea Serratus raise with RTB (partial shoulder flexion) 2x10 Bicep curls 4# R 2x10; 2# L (attempted, but painful) Reverse wall push  up x8 Theraband row/ext GTB 2x10ea    PATIENT EDUCATION:  Education details:exercise form/rationale Person educated: Patient Education method: Explanation, Demonstration, Tactile cues, and Verbal cues Education comprehension: verbalized understanding, returned demonstration,  verbal cues required, tactile cues required, and needs further education  HOME EXERCISE PROGRAM: Walking program  Research counselor/psych with chronic pain/fibromyalgia specialty  Aquatic This aquatic home exercise program from [employer] utilizes pictures from land based exercises, but has been adapted prior to lamination and issuance.   Access Code: [email or web address] URL: [email or web address] Date: 10/24/2022 Prepared by: Geni Bers  Exercises - Hand Buoy Carry  - Seated Straddle on Flotation Forward Breast Stroke Arms and Bicycle Legs  - Side lunge with hand buoys  - Push-Up on Pool Wall  - 1 x daily - 1-3 x weekly - 1-3 sets - 10 reps - Hip Hiking on Step  - 1 x daily - 1-3 x weekly - 1-3 sets - 10 reps - Standing Hip Hinge  - 1 x daily - 1-3 x weekly - 1-2 sets - 10 reps - Noodle Stomp  - 1 x daily - 1-3 x weekly - 1-2 sets - 10 reps - Noodle press  - 1 x daily - 1-3 x weekly - 1-2 sets - 10 reps - Standing Shoulder Horizontal Abduction with Resistance  - 1 x daily - 1-3 x weekly - 1-2 sets - 10 reps   ASSESSMENT:  CLINICAL IMPRESSION:  Weakness with R shoulder flexion MMT using dynamometer. Improvement in ER strength bilaterally. Worked on thoracic stretching and rotation today with good tolerance. With supine active flexion, pt reports weakness in end range and demonstrates shakiness. With manual resistance in end ranges he demonstrates weakness on L UE with shoulder extension from end range to ~90deg.  Good performance noted with rhythmic stabilization.  Discussed self STM at home using tennis balls and theracane for management of tightness and trigger points.   Re-cert/PN: Pt reports he  has improved his sitting postures while sitting and working  and along with the aquatic PT has decreased overall pain, with higher or max pain sensitivity less frequent. He has had only a few decreases in pain to 2-3/10 with ~5/10 being the average.  He has increased his walking (program) to 3 x daily 1/2 mile which appears to have some relation to his recent slight increase in pain. He is advised to complete just 3 x per week rather than daily.  As in chart above he has had a significant improvement in LE strength.  He has had good tolerance to aquatic intervention and has reached his max potential in setting.  He will return for 1 more visit to ensure indep with aquatic final HEP then return to land intervention for remainder of this certification to progress toward lifting and UE strength goals and add more focused attention to cervicalgia and shoulder discomfort.       REHAB POTENTIAL: Fair diagnosis  CLINICAL DECISION MAKING: Evolving/moderate complexity  EVALUATION COMPLEXITY: Low   GOALS: Goals reviewed with patient? Yes  SHORT TERM GOALS: Target date: 9/14  Pt will be performing walking program on a daily basis with understanding of rest breaks/modifications necessary Baseline: Goal status: Met 09/27/22    LONG TERM GOALS: Target date:POC DATE  UEFI to improve by MDC Baseline:  Goal status: ongoing 10/26/22  2.  Each MMT via hand held dynamometry to increase by 10+lb Baseline:  Goal status: ongoing 10/26/22  3.  Able to lift objects <10lb throughout his day without dropping them or arms "giving out" Baseline:  Goal status:achieved  4.  Pt will verbalize increased tolerance to work activities and postures necessary Baseline:  Goal status: Met 10/26/22  5. Pt will be  able to demonstrate/report ability to sit/stand/sleep for extended periods of time without pain in order to demonstrate functional improvement and tolerance to static positioning.    Goal status:INITIAL       PLAN:  PT FREQUENCY: 1-2x/week  PT DURATION: 5 weeks  PLANNED INTERVENTIONS: Therapeutic exercises, Therapeutic activity, Neuromuscular re-education, Balance training, Gait training, Patient/Family education, Self Care, Joint mobilization, Stair training, Aquatic Therapy, Dry Needling, Spinal mobilization, Cryotherapy, Moist heat, Taping, Ionotophoresis 4mg /ml Dexamethasone, Manual therapy, and Re-evaluation.  PLAN FOR NEXT SESSION: continue with pain management and progressive exercise    Zebedee Iba PT, DPT 01/19/23 3:17 PM

## 2023-01-22 DIAGNOSIS — F331 Major depressive disorder, recurrent, moderate: Secondary | ICD-10-CM | POA: Diagnosis not present

## 2023-01-22 DIAGNOSIS — F411 Generalized anxiety disorder: Secondary | ICD-10-CM | POA: Diagnosis not present

## 2023-01-22 DIAGNOSIS — F902 Attention-deficit hyperactivity disorder, combined type: Secondary | ICD-10-CM | POA: Diagnosis not present

## 2023-01-25 ENCOUNTER — Ambulatory Visit (HOSPITAL_BASED_OUTPATIENT_CLINIC_OR_DEPARTMENT_OTHER): Payer: BC Managed Care – PPO | Admitting: Physical Therapy

## 2023-01-25 ENCOUNTER — Encounter (HOSPITAL_BASED_OUTPATIENT_CLINIC_OR_DEPARTMENT_OTHER): Payer: Self-pay | Admitting: Physical Therapy

## 2023-01-25 DIAGNOSIS — M6281 Muscle weakness (generalized): Secondary | ICD-10-CM

## 2023-01-25 DIAGNOSIS — M542 Cervicalgia: Secondary | ICD-10-CM | POA: Diagnosis not present

## 2023-01-25 DIAGNOSIS — R252 Cramp and spasm: Secondary | ICD-10-CM

## 2023-01-25 NOTE — Therapy (Signed)
OUTPATIENT PHYSICAL THERAPY TREATMENT   Patient Name: Henry Jennings MRN: [id number] DOB:24-Nov-1970, 53 y.o., male Today's Date: 01/25/2023  END OF SESSION:  PT End of Session - 01/25/23 1350     Visit Number 16    Number of Visits 22    Date for PT Re-Evaluation 03/07/23    Authorization Time Period 12/1-1/29    Authorization - Visit Number 3    Authorization - Number of Visits 9    PT Start Time 1350    PT Stop Time 1429    PT Time Calculation (min) 39 min    Activity Tolerance Patient tolerated treatment well    Behavior During Therapy WFL for tasks assessed/performed                   Past Medical History:  Diagnosis Date   Depression    Herniated disc, cervical    Past Surgical History:  Procedure Laterality Date   ANTERIOR CERVICAL DECOMP/DISCECTOMY FUSION N/A 08/02/2021   Procedure: C4-5, C5-6, C6-7, C7-T1 ANTERIOR CERVICAL DISCECTOMY AND FUSION WITH PLATES AND SCREWS;  Surgeon: Kerrin Champagne, MD;  Location: [facility] OR;  Service: Orthopedics;  Laterality: N/A;   colonscopy  2021   WISDOM TOOTH EXTRACTION Left 2020   Left lower   Patient Active Problem List   Diagnosis Date Noted   Spinal stenosis of cervical region    Cervical disc herniation    Other spondylosis with myelopathy, cervical region    S/P cervical spinal fusion 08/02/2021   Anxiety 06/06/2021   Bipolar disorder (HCC) 06/06/2021   Chronic fatigue syndrome 06/06/2021   Fibromyalgia 06/06/2021   Constipation 06/06/2021   Insomnia 06/06/2021   Panic disorder 06/06/2021   Gait abnormality 06/06/2021   Neck pain 06/06/2021    REFERRING PROVIDER: Erick Colace, MD   REFERRING DIAG: M79.7 (ICD-10-CM) - Fibromyalgia   Rationale for Evaluation and Treatment: Rehabilitation  THERAPY DIAG:  Cervicalgia  Muscle weakness (generalized)  Cramp and spasm  ONSET DATE: 2018   SUBJECTIVE:                                                                                                                                                                                            SUBJECTIVE STATEMENT:  I have been experiencing some stuff in my cervical region. I think the lumbar herniation is creating pain in lower back when walking more than a mile. I think the fibromyalgia is causing issues with everything with weather fluctuations.   FROM EVAL: Increasing issues since 2018. Went to PT and it seemed to help. Seemed ot get worse over the pandemic.  Went to rheumatology due to other symptoms happening. No dx came from those visits. In 2022 was dx with fibromyalgia. ACDF Aug 2023. It took a lot longer to heal from that than I expected. From 2021 and on I was dropping things and things were too heavy but a lot of it has gone away.  After too much lifting or 10+lb and arms will give. Most nerves are fine but there was one on both side that were limited in NCV testing.  Skin hypersensitivity that can be irritated by certain clothes.    PERTINENT HISTORY:  H/o migraines-sweatband and rams horn pattern  PAIN:  Are you having pain? Yes: NPRS scale: 7/10 Pain location: cervical to shoulders Pain description: achy, sharp with certain motions Aggravating factors: never know Relieving factors: hot shower (sometimes), CBD, rotate hot and cold, massage  PRECAUTIONS:  None  RED FLAGS: None   WEIGHT BEARING RESTRICTIONS:  No  FALLS:  Has patient fallen in last 6 months?  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    OBJECTIVE:   DIAGNOSTIC FINDINGS:  X-ray of the cervical spine taken on 08/17/2022 was independently reviewed  and interpreted, showing C4-7 ACDF.  Allografts are in appropriate position. Anterior plate and screws are in good position. There is no lucency around the screws or  the interbody cages. No fracture or dislocation seen.  From MD note: His EMG does not show any signs of neuropathy or ulnar neuropathy. He does have decreased recruitment in tricep muscles bilaterally but no significant tricep weakness.  PATIENT SURVEYS:  UEFS eval: 46  UEFI: 49/80  COGNITIVE STATUS: Within functional limits for tasks assessed    POSTURE:  increased lumbar lordosis  HAND DOMINANCE:  Right   Body Part #1 Cervical  PALPATION: Eval: more spasm noted in Lt upper traps  UE MMT UPPER EXTREMITY MMT:   MMT (lb) Right eval Left eval Rt/Lt 11/19 Rt/Lt 1/17  Shoulder flexion 12.8 12.4 16.9/14.1 12.0/15.6  Shoulder extension      Shoulder abduction      Shoulder adduction      Shoulder extension      Shoulder internal rotation      Shoulder external rotation 11.1 7.7 7.3/11.6 19.1/16.6  Middle trapezius      Lower trapezius      Elbow flexion      Elbow extension       (Blank rows = not tested)  1/8 Too painful into UE to test       Body Part #2 Hip    LE MMT LOWER EXTREMITY MMT:    MMT Right eval Left eval R /L 10/24  Hip flexion 19.5 19.8 43.9 / 42.6  Hip extension     Hip abduction 23.2 26.5 29.0 / 27.9  Hip adduction     Hip internal rotation     Hip external rotation     Knee flexion     Knee extension 26.4 31.3 43.2 / 43.9  Ankle dorsiflexion     Ankle plantarflexion     Ankle inversion     Ankle eversion      (Blank rows = not tested)  1/8 Too painful to test LE at today's visit    FUNCTIONAL TESTS:  EVAL: 30 seconds chair stand test  5;    places right foot under chair to press up, does not use UEs   10/26/22: 30s sts: 9    01/09/22 10 with UE   TREATMENT:     Treatment                            1/23:  See HEP Dicussion of desensitization.    Treatment                          1/17  STM to thoracic ps Instruction in theracane and use of tennis balls  Quadruped "cat" stretch x10 Quadruped thread needle stretch  x5bil Supine active shoulder flexion 1# x15ea Supine rhythmic stabilization 30sec ea bil- 90deg flexion End range supine manually resisted flexion/extension PNF D2 supine x10ea   Treatment                          1/8  supine rib mobs, bil first rib depression grade II STM bilat UE and C/S paraspinals Seated scap rolls 2x10 each direction YTB horizontal ABD 3x8 Desensitization techniques Exam findings                                                                                                                               PATIENT EDUCATION:  Education details:exercise form/rationale Person educated: Patient Education method: Explanation, Demonstration, Tactile cues, and Verbal cues Education comprehension: verbalized understanding, returned demonstration, verbal cues required, tactile cues required, and needs further education  HOME EXERCISE PROGRAM: Walking program  Research counselor/psych with chronic pain/fibromyalgia specialty Land: [email or web address]  Aquatic This aquatic home exercise program from [employer] utilizes pictures from land based exercises, but has been adapted prior to lamination and issuance.  Access Code: [email or web address] URL: [email or web address]    ASSESSMENT:  CLINICAL IMPRESSION:  Pt has made some improvements since beginning PT. He continues to experience flares of pain that limit functional ability and participation in ADLs. Discussed bath/compression blanket for desensitization in high pain levels and importance of regular mobility. I encouraged him to sign up for regular massages as those have really helped in the past. I believe he will benefit from continued PT at time/frequency stated in the plan of care to continue with progression of strength, ROM and functional tolerance.   Re-cert/PN: Pt reports he has improved his sitting postures while sitting and working  and along with the aquatic PT has decreased overall pain, with higher or max pain  sensitivity less frequent. He has had only a few decreases in pain to 2-3/10 with ~5/10 being the average.  He has increased his walking (program) to 3 x daily 1/2 mile which appears to have some relation to his recent slight increase in pain. He is advised to complete just 3 x per week rather than daily.  As in chart above he has had a  significant improvement in LE strength.  He has had good tolerance to aquatic intervention and has reached his max potential in setting.  He will return for 1 more visit to ensure indep with aquatic final HEP then return to land intervention for remainder of this certification to progress toward lifting and UE strength goals and add more focused attention to cervicalgia and shoulder discomfort.       REHAB POTENTIAL: Fair diagnosis  CLINICAL DECISION MAKING: Evolving/moderate complexity  EVALUATION COMPLEXITY: Low   GOALS: Goals reviewed with patient? Yes  SHORT TERM GOALS: Target date: 9/14  Pt will be performing walking program on a daily basis with understanding of rest breaks/modifications necessary Baseline: Goal status: Met 09/27/22    LONG TERM GOALS: Target date:POC DATE  UEFI to improve by MDC Baseline:  Goal status: ongoing  2.  Each MMT via hand held dynamometry to increase by 10+lb Baseline:  Goal status: ongoing   3.  Able to lift objects <10lb throughout his day without dropping them or arms "giving out" Baseline:  Goal status:achieved  4.  Pt will verbalize increased tolerance to work activities and postures necessary Baseline:  Goal status: Met 10/26/22  5. Pt will be able to demonstrate/report ability to sit/stand/sleep for extended periods of time without pain in order to demonstrate functional improvement and tolerance to static positioning.    Goal status:ongoin      PLAN:  PT FREQUENCY: 1-2x/week  PT DURATION: 5 weeks  PLANNED INTERVENTIONS: Therapeutic exercises, Therapeutic activity, Neuromuscular  re-education, Balance training, Gait training, Patient/Family education, Self Care, Joint mobilization, Stair training, Aquatic Therapy, Dry Needling, Spinal mobilization, Cryotherapy, Moist heat, Taping, Ionotophoresis 4mg /ml Dexamethasone, Manual therapy, and Re-evaluation.  PLAN FOR NEXT SESSION: continue with pain management and progressive exercise    Cordarrell Sane C. Howard Bunte PT, DPT 01/25/23 8:15 PM

## 2023-01-26 ENCOUNTER — Encounter
Payer: BC Managed Care – PPO | Attending: Physical Medicine & Rehabilitation | Admitting: Physical Medicine & Rehabilitation

## 2023-01-26 ENCOUNTER — Encounter: Payer: Self-pay | Admitting: Physical Medicine & Rehabilitation

## 2023-01-26 VITALS — BP 122/87 | HR 91 | Ht 65.0 in | Wt 176.0 lb

## 2023-01-26 DIAGNOSIS — Z981 Arthrodesis status: Secondary | ICD-10-CM | POA: Diagnosis not present

## 2023-01-26 DIAGNOSIS — M47812 Spondylosis without myelopathy or radiculopathy, cervical region: Secondary | ICD-10-CM | POA: Insufficient documentation

## 2023-01-26 DIAGNOSIS — M797 Fibromyalgia: Secondary | ICD-10-CM | POA: Insufficient documentation

## 2023-01-26 MED ORDER — TIZANIDINE HCL 4 MG PO TABS: 4.0000 mg | ORAL_TABLET | Freq: Three times a day (TID) | ORAL | 2 refills | Status: AC

## 2023-01-26 MED ORDER — DICLOFENAC SODIUM 75 MG PO TBEC: 75.0000 mg | DELAYED_RELEASE_TABLET | Freq: Two times a day (BID) | ORAL | 0 refills | Status: AC

## 2023-01-26 MED ORDER — GABAPENTIN 400 MG PO CAPS: 400.0000 mg | ORAL_CAPSULE | Freq: Two times a day (BID) | ORAL | 2 refills | Status: DC

## 2023-01-26 NOTE — Progress Notes (Signed)
Subjective:    Patient ID: Henry Jennings, male    DOB: May 10, 1970, 53 y.o.   MRN: [id number]  HPI 53 year old male with history of cervical spondylosis with myelopathy who underwent multilevel cervical fusion by Dr. Otelia Sergeant approximately 2 years ago.  He has followed up with Dr. Christell Constant.  In addition to chronic neck pain and stiffness of his upper back he also is starting to develop some increasing low back pain.  This as well.  Patient has a history of fibromyalgia syndrome and does have body pains Finished aqua therapy , helped with movement  Dry needling was helpful as well  DIclofenac po every day Gabapentin 400mg  BID occ extra  Tizanidine 4mg  TID  Shoulder pain is doing better status post therapy Pain Inventory Average Pain 5 Pain Right Now 5 My pain is constant, sharp, tingling, and aching  In the last 24 hours, has pain interfered with the following? General activity 6 Relation with others 4 Enjoyment of life 6 What TIME of day is your pain at its worst? morning  and night Sleep (in general) Fair  Pain is worse with: walking, bending, sitting, inactivity, standing, and some activites Pain improves with: rest, medication, and hot shower Relief from Meds: 4  History reviewed. No pertinent family history. Social History   Socioeconomic History   Marital status: Married    Spouse name: [name]   Number of children: Not on file   Years of education: Not on file   Highest education level: Not on file  Occupational History   Not on file  Tobacco Use   Smoking status: Never   Smokeless tobacco: Never  Vaping Use   Vaping status: Never Used  Substance and Sexual Activity   Alcohol use: Not Currently   Drug use: Never   Sexual activity: Yes  Other Topics Concern   Not on file  Social History Narrative   Not on file   Social Drivers of Health   Financial Resource Strain: Not on file  Food Insecurity: Not on file  Transportation Needs: Not on file  Physical  Activity: Not on file  Stress: Not on file  Social Connections: Not on file   Past Surgical History:  Procedure Laterality Date   ANTERIOR CERVICAL DECOMP/DISCECTOMY FUSION N/A 08/02/2021   Procedure: C4-5, C5-6, C6-7, C7-T1 ANTERIOR CERVICAL DISCECTOMY AND FUSION WITH PLATES AND SCREWS;  Surgeon: Kerrin Champagne, MD;  Location: [facility] OR;  Service: Orthopedics;  Laterality: N/A;   colonscopy  2021   WISDOM TOOTH EXTRACTION Left 2020   Left lower   Past Surgical History:  Procedure Laterality Date   ANTERIOR CERVICAL DECOMP/DISCECTOMY FUSION N/A 08/02/2021   Procedure: C4-5, C5-6, C6-7, C7-T1 ANTERIOR CERVICAL DISCECTOMY AND FUSION WITH PLATES AND SCREWS;  Surgeon: Kerrin Champagne, MD;  Location: [facility] OR;  Service: Orthopedics;  Laterality: N/A;   colonscopy  2021   WISDOM TOOTH EXTRACTION Left 2020   Left lower   Past Medical History:  Diagnosis Date   Depression    Herniated disc, cervical    BP 122/87   Pulse 91   Ht 5\' 5"  (1.651 m)   Wt 176 lb (79.8 kg)   SpO2 97%   BMI 29.29 kg/m   Opioid Risk Score:   Fall Risk Score:  `1  Depression screen PHQ 2/9     01/26/2023    1:31 PM 06/06/2022    2:23 PM 03/02/2022    2:07 PM 12/29/2021    2:37 PM 11/04/2021  11:53 AM  Depression screen PHQ 2/9  Decreased Interest 1 1 0 0 1  Down, Depressed, Hopeless 1 1 0 0 2  PHQ - 2 Score 2 2 0 0 3  Altered sleeping     1  Tired, decreased energy     3  Change in appetite     2  Feeling bad or failure about yourself      3  Trouble concentrating     1  Moving slowly or fidgety/restless     1  Suicidal thoughts     0  PHQ-9 Score     14    Review of Systems  Musculoskeletal:  Positive for back pain and neck pain.       Pain in all joint, shoulder & arms  All other systems reviewed and are negative.      Objective:   Physical Exam  Patient with tenderness palpation bilateral upper trapezius bilateral upper medial scapular border bilateral subscapularis area as well as medial  scapular border bilaterally. Mild tenderness palpation lumbar paraspinals. Negative straight leg raise bilaterally Normal strength sensation and strength in lower extremities. Knee instability.       Assessment & Plan:   1.  History of cervical spondylosis with myelopathy status fusion Dr. Otelia Sergeant.  Noted in terms of his function returned still has some sensory paresthesias. No severe spasticity, follow-up with Dr. Christell Constant Continued diclofenac daily Continue tizanidine 4 mg 3 times daily Trial acupuncture 2.  Fibromyalgia syndrome continue gabapentin 400 mg twice daily, continue tizanidine 4 mg 3 times daily Trial acupuncture 3.  Low back pain subacute patient plans to follow-up with Dr. Christell Constant on this

## 2023-01-26 NOTE — Patient Instructions (Signed)
Acupuncture Acupuncture is a type of treatment that involves stimulating specific points on your body by inserting thin needles through your skin. Acupuncture is often used to treat pain, but it may also be used to help relieve other types of symptoms. Your health care provider may recommend acupuncture to help treat various conditions, such as: Migraine and tension headaches. Nausea and vomiting after a surgery or cancer treatment. Sudden or severe (acute) pain, or long-term (chronic) pain. Addiction. Acupuncture is based on traditional [place] medicine, which recognizes more than 2,000 points on the body that connect energy pathways (meridians) through the body. The goal in stimulating these points is to balance the physical, emotional, and mental energy in your body. Acupuncture is done by a health care provider who has specialized training (licensed acupuncture practitioner). Treatment often requires several acupuncture sessions. You may have acupuncture along with other medical treatments. Tell a health care provider about: Any allergies you have. All medicines you are taking, including vitamins, herbs, eye drops, creams, and over-the-counter medicines. Any blood disorders you have. Any surgeries you have had. Any medical conditions you have. Whether you are pregnant or may be pregnant. What are the risks? Generally, this is a safe treatment. However, problems may occur, including: Skin infection. Damage to organs or structures that are under the skin if a needle is placed too deeply. This is rare. What happens before the treatment? Your acupuncture practitioner will ask about your medical history and your symptoms. You may have a physical exam. What happens during the treatment? The exact procedure will depend on your condition and how your acupuncture provider treats it. In general: Your skin will be cleaned with a germ-killing (antiseptic) solution. Your acupuncture practitioner will  open a new set of germ-free (sterile) needles. The needles will be gently inserted into your skin. They will be left in place for a certain amount of time. You may feel a tingling or burning sensation for a very short period of time. Your acupuncture practitioner may: Apply electrical energy to the needles. Adjust the needles in certain ways. After your procedure, the acupuncture practitioner will remove the needles, throw them away, and clean your skin. The procedure may vary among health care providers. What can I expect after the treatment? People react differently to acupuncture. Make sure you ask your acupuncture provider what to expect after your treatment. It is common to have: Minor bruising. Mild pain. A small amount of bleeding. Follow these instructions at home: Follow any instructions given by your provider after the treatment. Keep all follow-up visits. This is important. Where to find more information [facility]: [email or web address] Contact a health care provider if: You have questions about your reaction to the treatment. You have soreness. You have skin irritation or redness. You have a fever. Summary Acupuncture is a type of treatment that involves stimulating specific points on your body by inserting thin needles through your skin. This treatment is often used to treat pain, but it may also be used to help relieve other types of symptoms. The exact procedure will depend on your condition and how your acupuncture provider treats it. This information is not intended to replace advice given to you by your health care provider. Make sure you discuss any questions you have with your health care provider. Document Revised: 08/25/2020 Document Reviewed: 08/25/2020 Elsevier Patient Education  2024 [employer].

## 2023-01-29 DIAGNOSIS — F331 Major depressive disorder, recurrent, moderate: Secondary | ICD-10-CM | POA: Diagnosis not present

## 2023-01-29 DIAGNOSIS — F902 Attention-deficit hyperactivity disorder, combined type: Secondary | ICD-10-CM | POA: Diagnosis not present

## 2023-01-29 DIAGNOSIS — F411 Generalized anxiety disorder: Secondary | ICD-10-CM | POA: Diagnosis not present

## 2023-02-01 ENCOUNTER — Encounter (HOSPITAL_BASED_OUTPATIENT_CLINIC_OR_DEPARTMENT_OTHER): Payer: Self-pay | Admitting: Physical Therapy

## 2023-02-01 ENCOUNTER — Ambulatory Visit (HOSPITAL_BASED_OUTPATIENT_CLINIC_OR_DEPARTMENT_OTHER): Payer: BC Managed Care – PPO | Admitting: Physical Therapy

## 2023-02-01 DIAGNOSIS — M6281 Muscle weakness (generalized): Secondary | ICD-10-CM | POA: Diagnosis not present

## 2023-02-01 DIAGNOSIS — R252 Cramp and spasm: Secondary | ICD-10-CM

## 2023-02-01 DIAGNOSIS — M542 Cervicalgia: Secondary | ICD-10-CM

## 2023-02-01 NOTE — Therapy (Signed)
OUTPATIENT PHYSICAL THERAPY TREATMENT   Patient Name: Henry Jennings MRN: [id number] DOB:June 27, 1970, 53 y.o., male Today's Date: 02/01/2023  END OF SESSION:  PT End of Session - 02/01/23 1356     Visit Number 17    Number of Visits 22    Date for PT Re-Evaluation 03/07/23    Authorization Time Period 1/27-3/27    Authorization - Visit Number 1    Authorization - Number of Visits 5    PT Start Time 1355   pt arrived late   PT Stop Time 1430    PT Time Calculation (min) 35 min    Activity Tolerance Patient tolerated treatment well    Behavior During Therapy [facility] for tasks assessed/performed                    Past Medical History:  Diagnosis Date   Depression    Herniated disc, cervical    Past Surgical History:  Procedure Laterality Date   ANTERIOR CERVICAL DECOMP/DISCECTOMY FUSION N/A 08/02/2021   Procedure: C4-5, C5-6, C6-7, C7-T1 ANTERIOR CERVICAL DISCECTOMY AND FUSION WITH PLATES AND SCREWS;  Surgeon: Kerrin Champagne, MD;  Location: [facility] OR;  Service: Orthopedics;  Laterality: N/A;   colonscopy  2021   WISDOM TOOTH EXTRACTION Left 2020   Left lower   Patient Active Problem List   Diagnosis Date Noted   Spinal stenosis of cervical region    Cervical disc herniation    Other spondylosis with myelopathy, cervical region    S/P cervical spinal fusion 08/02/2021   Anxiety 06/06/2021   Bipolar disorder (HCC) 06/06/2021   Chronic fatigue syndrome 06/06/2021   Fibromyalgia 06/06/2021   Constipation 06/06/2021   Insomnia 06/06/2021   Panic disorder 06/06/2021   Gait abnormality 06/06/2021   Neck pain 06/06/2021    REFERRING PROVIDER: Erick Colace, MD   REFERRING DIAG: M79.7 (ICD-10-CM) - Fibromyalgia   Rationale for Evaluation and Treatment: Rehabilitation  THERAPY DIAG:  Cervicalgia  Muscle weakness (generalized)  Cramp and spasm  ONSET DATE: 2018   SUBJECTIVE:                                                                                                                                                                                            SUBJECTIVE STATEMENT:  Not as bad today as normal. Hot shower worked ok, bath didn't seem to do much- I could feel it was making a difference in the arms/shoulders but by the time it cooled down and I got out, I didn't notice a difference. Fri and Sat were bad days. Did some walking. Wynelle Link and Monday were good  days, up until today have been average.   FROM EVAL: Increasing issues since 2018. Went to PT and it seemed to help. Seemed ot get worse over the pandemic. Went to rheumatology due to other symptoms happening. No dx came from those visits. In 2022 was dx with fibromyalgia. ACDF Aug 2023. It took a lot longer to heal from that than I expected. From 2021 and on I was dropping things and things were too heavy but a lot of it has gone away.  After too much lifting or 10+lb and arms will give. Most nerves are fine but there was one on both side that were limited in NCV testing.  Skin hypersensitivity that can be irritated by certain clothes.    PERTINENT HISTORY:  H/o migraines-sweatband and rams horn pattern  PAIN:  Are you having pain? Yes: NPRS scale: 7/10 Pain location: cervical to shoulders Pain description: achy, sharp with certain motions Aggravating factors: never know Relieving factors: hot shower (sometimes), CBD, rotate hot and cold, massage  PRECAUTIONS:  None  RED FLAGS: None   WEIGHT BEARING RESTRICTIONS:  No  FALLS:  Has patient fallen in last 6 months?  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    OBJECTIVE:   DIAGNOSTIC FINDINGS:  X-ray of the cervical spine taken on 08/17/2022 was independently reviewed  and interpreted, showing C4-7 ACDF.  Allografts are in  appropriate position. Anterior plate and screws are in good position. There is no lucency around the screws or the interbody cages. No fracture or dislocation seen.  From MD note: His EMG does not show any signs of neuropathy or ulnar neuropathy. He does have decreased recruitment in tricep muscles bilaterally but no significant tricep weakness.  PATIENT SURVEYS:  UEFS eval: 46  UEFI: 49/80  COGNITIVE STATUS: Within functional limits for tasks assessed    POSTURE:  increased lumbar lordosis  HAND DOMINANCE:  Right   Body Part #1 Cervical  PALPATION: Eval: more spasm noted in Lt upper traps  UE MMT UPPER EXTREMITY MMT:   MMT (lb) Right eval Left eval Rt/Lt 11/19 Rt/Lt 1/17  Shoulder flexion 12.8 12.4 16.9/14.1 12.0/15.6  Shoulder extension      Shoulder abduction      Shoulder adduction      Shoulder extension      Shoulder internal rotation      Shoulder external rotation 11.1 7.7 7.3/11.6 19.1/16.6  Middle trapezius      Lower trapezius      Elbow flexion      Elbow extension       (Blank rows = not tested)  1/8 Too painful into UE to test       Body Part #2 Hip    LE MMT LOWER EXTREMITY MMT:    MMT Right eval Left eval R /L 10/24  Hip flexion 19.5 19.8 43.9 / 42.6  Hip extension     Hip abduction 23.2 26.5 29.0 / 27.9  Hip adduction     Hip internal rotation     Hip external rotation     Knee flexion     Knee extension 26.4 31.3 43.2 / 43.9  Ankle dorsiflexion     Ankle plantarflexion     Ankle inversion     Ankle eversion      (  Blank rows = not tested)  1/8 Too painful to test LE at today's visit    FUNCTIONAL TESTS:  EVAL: 30 seconds chair stand test 5;    places right foot under chair to press up, does not use UEs   10/26/22: 30s sts: 9    01/09/22 10 with UE   TREATMENT:     Treatment                            02/01/23:  Cup reach to overhead shelf & remove with opposite UE Standing on airex: ball handoff in horiz abd flexion  to 90, extension; biceps curls 1lb without full extension at  end range.  Step on/off of airex SLS   Treatment                            1/23:  See HEP Dicussion of desensitization.    Treatment                          1/17  STM to thoracic ps Instruction in theracane and use of tennis balls  Quadruped "cat" stretch x10 Quadruped thread needle stretch x5bil Supine active shoulder flexion 1# x15ea Supine rhythmic stabilization 30sec ea bil- 90deg flexion End range supine manually resisted flexion/extension PNF D2 supine x10ea                                                                                                                                 PATIENT EDUCATION:  Education details:exercise form/rationale Person educated: Patient Education method: Programmer, multimedia, Demonstration, Tactile cues, and Verbal cues Education comprehension: verbalized understanding, returned demonstration, verbal cues required, tactile cues required, and needs further education  HOME EXERCISE PROGRAM: Walking program  Research counselor/psych with chronic pain/fibromyalgia specialty Land: [id number]  Aquatic This aquatic home exercise program from [employer] utilizes pictures from land based exercises, but has been adapted prior to lamination and issuance.  Access Code: [email or web address] URL: [email or web address]    ASSESSMENT:  CLINICAL IMPRESSION:  Focused on coordination and functional motions today wihtout complaints of increased pain.   Re-cert/PN: Pt reports he has improved his sitting postures while sitting and working  and along with the aquatic PT has decreased overall pain, with higher or max pain sensitivity less frequent. He has had only a few decreases in pain to 2-3/10 with ~5/10 being the average.  He has increased his walking (program) to 3 x daily 1/2 mile which appears to have some relation to his recent slight increase in pain. He is advised to complete just 3 x  per week rather than daily.  As in chart above he has had a significant improvement in LE strength.  He has had good tolerance to aquatic intervention and has reached his max potential in setting.  He will return for 1  more visit to ensure indep with aquatic final HEP then return to land intervention for remainder of this certification to progress toward lifting and UE strength goals and add more focused attention to cervicalgia and shoulder discomfort.       REHAB POTENTIAL: Fair diagnosis  CLINICAL DECISION MAKING: Evolving/moderate complexity  EVALUATION COMPLEXITY: Low   GOALS: Goals reviewed with patient? Yes  SHORT TERM GOALS: Target date: 9/14  Pt will be performing walking program on a daily basis with understanding of rest breaks/modifications necessary Baseline: Goal status: Met 09/27/22    LONG TERM GOALS: Target date:POC DATE  UEFI to improve by MDC Baseline:  Goal status: ongoing  2.  Each MMT via hand held dynamometry to increase by 10+lb Baseline:  Goal status: ongoing   3.  Able to lift objects <10lb throughout his day without dropping them or arms "giving out" Baseline:  Goal status:achieved  4.  Pt will verbalize increased tolerance to work activities and postures necessary Baseline:  Goal status: Met 10/26/22  5. Pt will be able to demonstrate/report ability to sit/stand/sleep for extended periods of time without pain in order to demonstrate functional improvement and tolerance to static positioning.    Goal status:ongoin      PLAN:  PT FREQUENCY: 1-2x/week  PT DURATION: 5 weeks  PLANNED INTERVENTIONS: Therapeutic exercises, Therapeutic activity, Neuromuscular re-education, Balance training, Gait training, Patient/Family education, Self Care, Joint mobilization, Stair training, Aquatic Therapy, Dry Needling, Spinal mobilization, Cryotherapy, Moist heat, Taping, Ionotophoresis 4mg /ml Dexamethasone, Manual therapy, and Re-evaluation.  PLAN FOR  NEXT SESSION: continue with pain management and progressive exercise    Darrie Macmillan C. Deeya Richeson PT, DPT 02/01/23 2:32 PM

## 2023-02-05 DIAGNOSIS — F331 Major depressive disorder, recurrent, moderate: Secondary | ICD-10-CM | POA: Diagnosis not present

## 2023-02-05 DIAGNOSIS — F411 Generalized anxiety disorder: Secondary | ICD-10-CM | POA: Diagnosis not present

## 2023-02-05 DIAGNOSIS — F902 Attention-deficit hyperactivity disorder, combined type: Secondary | ICD-10-CM | POA: Diagnosis not present

## 2023-02-08 ENCOUNTER — Ambulatory Visit (HOSPITAL_BASED_OUTPATIENT_CLINIC_OR_DEPARTMENT_OTHER): Payer: BC Managed Care – PPO | Attending: Physical Medicine & Rehabilitation

## 2023-02-08 ENCOUNTER — Encounter (HOSPITAL_BASED_OUTPATIENT_CLINIC_OR_DEPARTMENT_OTHER): Payer: Self-pay

## 2023-02-08 DIAGNOSIS — R252 Cramp and spasm: Secondary | ICD-10-CM | POA: Insufficient documentation

## 2023-02-08 DIAGNOSIS — M542 Cervicalgia: Secondary | ICD-10-CM | POA: Insufficient documentation

## 2023-02-08 DIAGNOSIS — M6281 Muscle weakness (generalized): Secondary | ICD-10-CM | POA: Insufficient documentation

## 2023-02-08 NOTE — Therapy (Signed)
 OUTPATIENT PHYSICAL THERAPY TREATMENT   Patient Name: Henry Jennings MRN: [id number] DOB:May 04, 1970, 53 y.o., male Today's Date: 02/08/2023  END OF SESSION:  PT End of Session - 02/08/23 1310     Visit Number 18    Number of Visits 22    Date for PT Re-Evaluation 03/07/23    Authorization Time Period 1/27-3/27    Authorization - Visit Number 2    Authorization - Number of Visits 5    PT Start Time 1304    PT Stop Time 1345    PT Time Calculation (min) 41 min    Activity Tolerance Patient tolerated treatment well    Behavior During Therapy WFL for tasks assessed/performed                     Past Medical History:  Diagnosis Date   Depression    Herniated disc, cervical    Past Surgical History:  Procedure Laterality Date   ANTERIOR CERVICAL DECOMP/DISCECTOMY FUSION N/A 08/02/2021   Procedure: C4-5, C5-6, C6-7, C7-T1 ANTERIOR CERVICAL DISCECTOMY AND FUSION WITH PLATES AND SCREWS;  Surgeon: Lucilla Lynwood BRAVO, MD;  Location: [facility] OR;  Service: Orthopedics;  Laterality: N/A;   colonscopy  2021   WISDOM TOOTH EXTRACTION Left 2020   Left lower   Patient Active Problem List   Diagnosis Date Noted   Spinal stenosis of cervical region    Cervical disc herniation    Other spondylosis with myelopathy, cervical region    S/P cervical spinal fusion 08/02/2021   Anxiety 06/06/2021   Bipolar disorder (HCC) 06/06/2021   Chronic fatigue syndrome 06/06/2021   Fibromyalgia 06/06/2021   Constipation 06/06/2021   Insomnia 06/06/2021   Panic disorder 06/06/2021   Gait abnormality 06/06/2021   Neck pain 06/06/2021    REFERRING PROVIDER: Carilyn Prentice BRAVO, MD   REFERRING DIAG: M79.7 (ICD-10-CM) - Fibromyalgia   Rationale for Evaluation and Treatment: Rehabilitation  THERAPY DIAG:  Cervicalgia  Muscle weakness (generalized)  Cramp and spasm  ONSET DATE: 2018   SUBJECTIVE:                                                                                                                                                                                            SUBJECTIVE STATEMENT:  Pt reports overall he is doing well. Some fatigue and soreness in arms with trying to increase his exercises at home.   FROM EVAL: Increasing issues since 2018. Went to PT and it seemed to help. Seemed ot get worse over the pandemic. Went to rheumatology due to other symptoms happening. No dx came from those visits. In 2022  was dx with fibromyalgia. ACDF Aug 2023. It took a lot longer to heal from that than I expected. From 2021 and on I was dropping things and things were too heavy but a lot of it has gone away.  After too much lifting or 10+lb and arms will give. Most nerves are fine but there was one on both side that were limited in NCV testing.  Skin hypersensitivity that can be irritated by certain clothes.    PERTINENT HISTORY:  H/o migraines-sweatband and rams horn pattern  PAIN:  Are you having pain? Yes: NPRS scale: 7/10 Pain location: cervical to shoulders Pain description: achy, sharp with certain motions Aggravating factors: never know Relieving factors: hot shower (sometimes), CBD, rotate hot and cold, massage  PRECAUTIONS:  None  RED FLAGS: None   WEIGHT BEARING RESTRICTIONS:  No  FALLS:  Has patient fallen in last 6 months?  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    OBJECTIVE:   DIAGNOSTIC FINDINGS:  X-ray of the cervical spine taken on 08/17/2022 was independently reviewed  and interpreted, showing C4-7 ACDF.  Allografts are in appropriate position. Anterior plate and screws are in good position. There is no lucency around the screws or the interbody cages. No fracture or dislocation seen.  From MD note: His EMG does not show any signs of neuropathy  or ulnar neuropathy. He does have decreased recruitment in tricep muscles bilaterally but no significant tricep weakness.  PATIENT SURVEYS:  UEFS eval: 46  UEFI: 49/80  COGNITIVE STATUS: Within functional limits for tasks assessed    POSTURE:  increased lumbar lordosis  HAND DOMINANCE:  Right   Body Part #1 Cervical  PALPATION: Eval: more spasm noted in Lt upper traps  UE MMT UPPER EXTREMITY MMT:   MMT (lb) Right eval Left eval Rt/Lt 11/19 Rt/Lt 1/17  Shoulder flexion 12.8 12.4 16.9/14.1 12.0/15.6  Shoulder extension      Shoulder abduction      Shoulder adduction      Shoulder extension      Shoulder internal rotation      Shoulder external rotation 11.1 7.7 7.3/11.6 19.1/16.6  Middle trapezius      Lower trapezius      Elbow flexion      Elbow extension       (Blank rows = not tested)  1/8 Too painful into UE to test       Body Part #2 Hip    LE MMT LOWER EXTREMITY MMT:    MMT Right eval Left eval R /L 10/24  Hip flexion 19.5 19.8 43.9 / 42.6  Hip extension     Hip abduction 23.2 26.5 29.0 / 27.9  Hip adduction     Hip internal rotation     Hip external rotation     Knee flexion     Knee extension 26.4 31.3 43.2 / 43.9  Ankle dorsiflexion     Ankle plantarflexion     Ankle inversion     Ankle eversion      (Blank rows = not tested)  1/8 Too painful to test LE at today's visit    FUNCTIONAL TESTS:  EVAL: 30 seconds chair stand test 5;    places right foot under chair to press up, does not use UEs  10/26/22: 30s sts: 9    01/09/22 10 with UE   TREATMENT:     Treatment                            02/01/23: Quadruped cat stretch x10 Quadruped thread needle stretch x5bil Supine ABC 1# for half, then 0# R x2, 2# L x2 Supine chest press 3# 2x10 Theraband row 2x10 Theraband extension GTB 2x10 PNF D2 RTB x10ea Wall push ups x10 Press out with RTB x10ea   Treatment                            02/01/23:  Cup reach to overhead shelf  & remove with opposite UE Standing on airex: ball handoff in horiz abd flexion to 90, extension; biceps curls 1lb without full extension at  end range.  Step on/off of airex SLS   Treatment                            1/23:  See HEP Dicussion of desensitization.    Treatment                          1/17  STM to thoracic ps Instruction in theracane and use of tennis balls  Quadruped cat stretch x10 Quadruped thread needle stretch x5bil Supine active shoulder flexion 1# x15ea Supine rhythmic stabilization 30sec ea bil- 90deg flexion End range supine manually resisted flexion/extension PNF D2 supine x10ea                                                                                                                                 PATIENT EDUCATION:  Education details:exercise form/rationale Person educated: Patient Education method: Programmer, Multimedia, Demonstration, Tactile cues, and Verbal cues Education comprehension: verbalized understanding, returned demonstration, verbal cues required, tactile cues required, and needs further education  HOME EXERCISE PROGRAM: Walking program  Research counselor/psych with chronic pain/fibromyalgia specialty Land: [id number]  Aquatic This aquatic home exercise program from [employer] utilizes pictures from land based exercises, but has been adapted prior to lamination and issuance.  Access Code: [email or web address] URL: [email or web address]    ASSESSMENT:  CLINICAL IMPRESSION:  Pt able to progress with strength and stability interventions as tolerated. He does remain challenged in strength by R UE and required modifications to exercises as needed. Some tightness present following exercises, though pt demonstrated improved endurance compared to previous sessions. Discussed self progression strategies at home for increasing strength with HEP.  Re-cert/PN: Pt reports he has improved his sitting postures while sitting and working  and  along with the aquatic PT has decreased overall pain, with higher or max pain sensitivity less frequent. He has had only a few decreases in pain to 2-3/10 with ~5/10 being the average.  He has increased his  walking (program) to 3 x daily 1/2 mile which appears to have some relation to his recent slight increase in pain. He is advised to complete just 3 x per week rather than daily.  As in chart above he has had a significant improvement in LE strength.  He has had good tolerance to aquatic intervention and has reached his max potential in setting.  He will return for 1 more visit to ensure indep with aquatic final HEP then return to land intervention for remainder of this certification to progress toward lifting and UE strength goals and add more focused attention to cervicalgia and shoulder discomfort.       REHAB POTENTIAL: Fair diagnosis  CLINICAL DECISION MAKING: Evolving/moderate complexity  EVALUATION COMPLEXITY: Low   GOALS: Goals reviewed with patient? Yes  SHORT TERM GOALS: Target date: 9/14  Pt will be performing walking program on a daily basis with understanding of rest breaks/modifications necessary Baseline: Goal status: Met 09/27/22    LONG TERM GOALS: Target date:POC DATE  UEFI to improve by MDC Baseline:  Goal status: ongoing  2.  Each MMT via hand held dynamometry to increase by 10+lb Baseline:  Goal status: ongoing   3.  Able to lift objects <10lb throughout his day without dropping them or arms giving out Baseline:  Goal status:achieved  4.  Pt will verbalize increased tolerance to work activities and postures necessary Baseline:  Goal status: Met 10/26/22  5. Pt will be able to demonstrate/report ability to sit/stand/sleep for extended periods of time without pain in order to demonstrate functional improvement and tolerance to static positioning.    Goal status:ongoin      PLAN:  PT FREQUENCY: 1-2x/week  PT DURATION: 5 weeks  PLANNED  INTERVENTIONS: Therapeutic exercises, Therapeutic activity, Neuromuscular re-education, Balance training, Gait training, Patient/Family education, Self Care, Joint mobilization, Stair training, Aquatic Therapy, Dry Needling, Spinal mobilization, Cryotherapy, Moist heat, Taping, Ionotophoresis 4mg /ml Dexamethasone , Manual therapy, and Re-evaluation.  PLAN FOR NEXT SESSION: continue with pain management and progressive exercise    Asberry Rodes, PTA  02/08/23 2:34 PM

## 2023-02-12 DIAGNOSIS — F902 Attention-deficit hyperactivity disorder, combined type: Secondary | ICD-10-CM | POA: Diagnosis not present

## 2023-02-12 DIAGNOSIS — F411 Generalized anxiety disorder: Secondary | ICD-10-CM | POA: Diagnosis not present

## 2023-02-12 DIAGNOSIS — F331 Major depressive disorder, recurrent, moderate: Secondary | ICD-10-CM | POA: Diagnosis not present

## 2023-02-13 ENCOUNTER — Ambulatory Visit (HOSPITAL_BASED_OUTPATIENT_CLINIC_OR_DEPARTMENT_OTHER): Payer: BC Managed Care – PPO

## 2023-02-13 ENCOUNTER — Encounter (HOSPITAL_BASED_OUTPATIENT_CLINIC_OR_DEPARTMENT_OTHER): Payer: Self-pay

## 2023-02-13 DIAGNOSIS — R252 Cramp and spasm: Secondary | ICD-10-CM

## 2023-02-13 DIAGNOSIS — M6281 Muscle weakness (generalized): Secondary | ICD-10-CM | POA: Diagnosis not present

## 2023-02-13 DIAGNOSIS — M542 Cervicalgia: Secondary | ICD-10-CM | POA: Diagnosis not present

## 2023-02-13 NOTE — Therapy (Signed)
OUTPATIENT PHYSICAL THERAPY TREATMENT   Patient Name: Henry Jennings MRN: [id number] DOB:1970-08-22, 53 y.o., male Today's Date: 02/13/2023  END OF SESSION:  PT End of Session - 02/13/23 1454     Visit Number 19    Number of Visits 22    Date for PT Re-Evaluation 03/07/23    Authorization Time Period 1/27-3/27    Authorization - Visit Number 3    Authorization - Number of Visits 5    PT Start Time 1348    PT Stop Time 1430    PT Time Calculation (min) 42 min    Activity Tolerance Patient tolerated treatment well    Behavior During Therapy WFL for tasks assessed/performed                      Past Medical History:  Diagnosis Date   Depression    Herniated disc, cervical    Past Surgical History:  Procedure Laterality Date   ANTERIOR CERVICAL DECOMP/DISCECTOMY FUSION N/A 08/02/2021   Procedure: C4-5, C5-6, C6-7, C7-T1 ANTERIOR CERVICAL DISCECTOMY AND FUSION WITH PLATES AND SCREWS;  Surgeon: Kerrin Champagne, MD;  Location: [facility] OR;  Service: Orthopedics;  Laterality: N/A;   colonscopy  2021   WISDOM TOOTH EXTRACTION Left 2020   Left lower   Patient Active Problem List   Diagnosis Date Noted   Spinal stenosis of cervical region    Cervical disc herniation    Other spondylosis with myelopathy, cervical region    S/P cervical spinal fusion 08/02/2021   Anxiety 06/06/2021   Bipolar disorder (HCC) 06/06/2021   Chronic fatigue syndrome 06/06/2021   Fibromyalgia 06/06/2021   Constipation 06/06/2021   Insomnia 06/06/2021   Panic disorder 06/06/2021   Gait abnormality 06/06/2021   Neck pain 06/06/2021    REFERRING PROVIDER: Erick Colace, MD   REFERRING DIAG: M79.7 (ICD-10-CM) - Fibromyalgia   Rationale for Evaluation and Treatment: Rehabilitation  THERAPY DIAG:  Cervicalgia  Muscle weakness (generalized)  Cramp and spasm  ONSET DATE: 2018   SUBJECTIVE:                                                                                                                                                                                            SUBJECTIVE STATEMENT:  Pt reports he is not doing well today. He has increased tightness and pain in neck, shoulder, and low back today. States this started mid day yesterday. Unsure of cause.   FROM EVAL: Increasing issues since 2018. Went to PT and it seemed to help. Seemed ot get worse over the pandemic. Went to rheumatology due to other  symptoms happening. No dx came from those visits. In 2022 was dx with fibromyalgia. ACDF Aug 2023. It took a lot longer to heal from that than I expected. From 2021 and on I was dropping things and things were too heavy but a lot of it has gone away.  After too much lifting or 10+lb and arms will give. Most nerves are fine but there was one on both side that were limited in NCV testing.  Skin hypersensitivity that can be irritated by certain clothes.    PERTINENT HISTORY:  H/o migraines-sweatband and rams horn pattern  PAIN:  Are you having pain? Yes: NPRS scale: 7/10 Pain location: cervical to shoulders Pain description: achy, sharp with certain motions Aggravating factors: never know Relieving factors: hot shower (sometimes), CBD, rotate hot and cold, massage  PRECAUTIONS:  None  RED FLAGS: None   WEIGHT BEARING RESTRICTIONS:  No  FALLS:  Has patient fallen in last 6 months?  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    OBJECTIVE:   DIAGNOSTIC FINDINGS:  X-ray of the cervical spine taken on 08/17/2022 was independently reviewed  and interpreted, showing C4-7 ACDF.  Allografts are in appropriate position. Anterior plate and screws are in good position. There is no lucency around the screws or the interbody cages. No fracture or dislocation seen.  From  MD note: His EMG does not show any signs of neuropathy or ulnar neuropathy. He does have decreased recruitment in tricep muscles bilaterally but no significant tricep weakness.  PATIENT SURVEYS:  UEFS eval: 46  UEFI: 49/80  COGNITIVE STATUS: Within functional limits for tasks assessed    POSTURE:  increased lumbar lordosis  HAND DOMINANCE:  Right   Body Part #1 Cervical  PALPATION: Eval: more spasm noted in Lt upper traps  UE MMT UPPER EXTREMITY MMT:   MMT (lb) Right eval Left eval Rt/Lt 11/19 Rt/Lt 1/17  Shoulder flexion 12.8 12.4 16.9/14.1 12.0/15.6  Shoulder extension      Shoulder abduction      Shoulder adduction      Shoulder extension      Shoulder internal rotation      Shoulder external rotation 11.1 7.7 7.3/11.6 19.1/16.6  Middle trapezius      Lower trapezius      Elbow flexion      Elbow extension       (Blank rows = not tested)  1/8 Too painful into UE to test       Body Part #2 Hip    LE MMT LOWER EXTREMITY MMT:    MMT Right eval Left eval R /L 10/24  Hip flexion 19.5 19.8 43.9 / 42.6  Hip extension     Hip abduction 23.2 26.5 29.0 / 27.9  Hip adduction     Hip internal rotation     Hip external rotation     Knee flexion     Knee extension 26.4 31.3 43.2 / 43.9  Ankle dorsiflexion     Ankle plantarflexion     Ankle inversion     Ankle eversion      (Blank rows = not tested)  1/8 Too painful to test LE at today's visit    FUNCTIONAL TESTS:  EVAL: 30 seconds chair stand test 5;    places right  foot under chair to press up, does not use UEs   10/26/22: 30s sts: 9    01/09/22 10 with UE   TREATMENT:      Treatment                            02/13/2023: Blank lines following charge title = not provided on this treatment date.   Manual:  TPDN No  There-ex: Supine cane flexion 2x5 Quadruped "cat" stretch x10 Quadruped thread needle stretch x5bil Seated cervical AROM 5" hold x10ea Seated shoulder rolls x10 Seated  lumbar flexion stretch 10sec x5 Supine chest press 3# 2x10 Supine active flexion 2x5 ea Posterior capsule stretch 15sec x3ea There-Act:  Self Care:  Nuro-Re-ed:  Gait Training:      Treatment                            02/01/23: Quadruped "cat" stretch x10 Quadruped thread needle stretch x5bil Supine ABC 1# for half, then 0# R x2, 2# L x2 Supine chest press 3# 2x10 Theraband row 2x10 Theraband extension GTB 2x10 PNF D2 RTB x10ea Wall push ups x10 Press out with RTB x10ea   Treatment                            02/01/23:  Cup reach to overhead shelf & remove with opposite UE Standing on airex: ball handoff in horiz abd flexion to 90, extension; biceps curls 1lb without full extension at  end range.  Step on/off of airex SLS   Treatment                            1/23:  See HEP Dicussion of desensitization.    Treatment                          1/17  STM to thoracic ps Instruction in theracane and use of tennis balls  Quadruped "cat" stretch x10 Quadruped thread needle stretch x5bil Supine active shoulder flexion 1# x15ea Supine rhythmic stabilization 30sec ea bil- 90deg flexion End range supine manually resisted flexion/extension PNF D2 supine x10ea                                                                                                                                 PATIENT EDUCATION:  Education details:exercise form/rationale Person educated: Patient Education method: Programmer, multimedia, Demonstration, Tactile cues, and Verbal cues Education comprehension: verbalized understanding, returned demonstration, verbal cues required, tactile cues required, and needs further education  HOME EXERCISE PROGRAM: Walking program  Research counselor/psych with chronic pain/fibromyalgia specialty Land: [email or web address]  Aquatic This aquatic home exercise program from [employer] utilizes pictures from land based exercises, but has been adapted prior to lamination and  issuance.  Access Code: [email or web address] URL: [email or web address]    ASSESSMENT:  CLINICAL IMPRESSION:  Incorporated additional ROM and stretching interventions today to address stiffness and increased pain level. He regressed with strengthening exercises, requiring additional rest breaks. Demonstrated increased fatigue and poor endurance in bil Ues today. Instructed pt to work on HEP as tolerated between now and next session.   Re-cert/PN: Pt reports he has improved his sitting postures while sitting and working  and along with the aquatic PT has decreased overall pain, with higher or max pain sensitivity less frequent. He has had only a few decreases in pain to 2-3/10 with ~5/10 being the average.  He has increased his walking (program) to 3 x daily 1/2 mile which appears to have some relation to his recent slight increase in pain. He is advised to complete just 3 x per week rather than daily.  As in chart above he has had a significant improvement in LE strength.  He has had good tolerance to aquatic intervention and has reached his max potential in setting.  He will return for 1 more visit to ensure indep with aquatic final HEP then return to land intervention for remainder of this certification to progress toward lifting and UE strength goals and add more focused attention to cervicalgia and shoulder discomfort.       REHAB POTENTIAL: Fair diagnosis  CLINICAL DECISION MAKING: Evolving/moderate complexity  EVALUATION COMPLEXITY: Low   GOALS: Goals reviewed with patient? Yes  SHORT TERM GOALS: Target date: 9/14  Pt will be performing walking program on a daily basis with understanding of rest breaks/modifications necessary Baseline: Goal status: Met 09/27/22    LONG TERM GOALS: Target date:POC DATE  UEFI to improve by MDC Baseline:  Goal status: ongoing  2.  Each MMT via hand held dynamometry to increase by 10+lb Baseline:  Goal status: ongoing   3.  Able to  lift objects <10lb throughout his day without dropping them or arms "giving out" Baseline:  Goal status:achieved  4.  Pt will verbalize increased tolerance to work activities and postures necessary Baseline:  Goal status: Met 10/26/22  5. Pt will be able to demonstrate/report ability to sit/stand/sleep for extended periods of time without pain in order to demonstrate functional improvement and tolerance to static positioning.    Goal status:ongoin      PLAN:  PT FREQUENCY: 1-2x/week  PT DURATION: 5 weeks  PLANNED INTERVENTIONS: Therapeutic exercises, Therapeutic activity, Neuromuscular re-education, Balance training, Gait training, Patient/Family education, Self Care, Joint mobilization, Stair training, Aquatic Therapy, Dry Needling, Spinal mobilization, Cryotherapy, Moist heat, Taping, Ionotophoresis 4mg /ml Dexamethasone, Manual therapy, and Re-evaluation.  PLAN FOR NEXT SESSION: continue with pain management and progressive exercise    Riki Altes, PTA  02/13/23 2:59 PM

## 2023-02-15 ENCOUNTER — Ambulatory Visit: Payer: BC Managed Care – PPO | Admitting: Orthopedic Surgery

## 2023-02-20 ENCOUNTER — Ambulatory Visit: Payer: BC Managed Care – PPO | Admitting: Orthopedic Surgery

## 2023-02-20 ENCOUNTER — Other Ambulatory Visit (INDEPENDENT_AMBULATORY_CARE_PROVIDER_SITE_OTHER): Payer: BC Managed Care – PPO

## 2023-02-20 DIAGNOSIS — Z981 Arthrodesis status: Secondary | ICD-10-CM | POA: Diagnosis not present

## 2023-02-20 NOTE — Progress Notes (Signed)
 Orthopedic Surgery Progress Note   Assessment: Patient is a 53 y.o. male who is s/p C4-T1 ACDF with my retired partner, Dr. Otelia Sergeant.  Still having bilateral trapezius pain.  No recent changes.  Also, complaining of low back pain Date of surgery: 08/02/2021 (~1.5 years post-op)   Plan: -Operative plans complete -Patient has been working with physical therapy which initially was helpful but has not provide him with any additional relief.  Recommended he continue with the exercise program -Continues Tylenol up to 1000mg  TID and ibuprofen for additional pain relief and for his low back pain -Since he is still having pain radiating into his bilateral trapezius, could consider a MRI cervical spine to evaluate for adjacent segment disease at our next visit -Follow-up in 6 months: X-rays at next visit: AP/lateral/flex/ex cervical   _________________________________________________________________________   Subjective: Patient has not noticed any changes in his symptoms since he was last seen.  He still feels better than he did before surgery.  However, he still having pain that radiates into his bilateral trapezius.  He feels weaker when doing repetitive activities.  He can only lift about 20 pounds a couple of times before his pain gets worse.  He has noticed decreased ability to walk long distances.  He said he can now walk about half a mile 3 times a day before pain in his low back starts.  He is not having any consistent pain radiating into his lower extremities.  Has not noticed any weakness in his legs.  Has had no bowel or bladder incontinence.  No saddle anesthesia.   General: No acute distress, appropriate affect Neurologic: alert, answering questions appropriately, following commands Respiratory: unlabored breathing on room air Skin: Incision is well healed   MSK (spine):   -Strength exam                                                   Right                Left Grip strength                 5/5                  5/5 Interosseus                  5/5                  5/5 Wrist extension            5/5                  5/5 Wrist flexion                 5/5                  5/5 Elbow flexion                5/5                  5/5 Deltoid                          5/5                  5/5   EHL  5/5                  5/5 TA                                 5/5                  5/5 GSC                             5/5                  5/5 Knee extension            5/5                  5/5 Hip flexion                    5/5                  5/5   -Sensory exam                           Sensation intact to light touch in L3-S1 nerve distributions of bilateral lower extremities             Sensation intact to light touch in C5-T1 nerve distributions of bilateral upper extremities     Imaging: X-ray of the cervical spine taken on 02/20/2023 were independently reviewed and interpreted, showing C4-T1 ACDF.  Allografts are in appropriate position.  There is an anterior plate over the cervical spine.  No lucency seen around the screws.  Fusion mass appears across the former disc spaces from C4-C7.  There does not appear to be as robust of a fusion mass across the C7/T1 disc space but interspinous motion between flexion and extension views is less than 1 mm and there is greater than 4 mm of interspinous motion above the fusion.  No fracture or dislocation seen.  No evidence of instability on flexion/extension views.     Patient name: Henry Jennings Patient MRN: [id number] Date: 02/20/23

## 2023-02-22 ENCOUNTER — Ambulatory Visit (HOSPITAL_BASED_OUTPATIENT_CLINIC_OR_DEPARTMENT_OTHER): Payer: BC Managed Care – PPO

## 2023-02-22 ENCOUNTER — Telehealth (HOSPITAL_BASED_OUTPATIENT_CLINIC_OR_DEPARTMENT_OTHER): Payer: Self-pay

## 2023-02-22 NOTE — Therapy (Deleted)
 OUTPATIENT PHYSICAL THERAPY TREATMENT   Patient Name: Henry Jennings MRN: [id number] DOB:03-28-1970, 53 y.o., male Today's Date: 02/22/2023  END OF SESSION:             Past Medical History:  Diagnosis Date   Depression    Herniated disc, cervical    Past Surgical History:  Procedure Laterality Date   ANTERIOR CERVICAL DECOMP/DISCECTOMY FUSION N/A 08/02/2021   Procedure: C4-5, C5-6, C6-7, C7-T1 ANTERIOR CERVICAL DISCECTOMY AND FUSION WITH PLATES AND SCREWS;  Surgeon: Kerrin Champagne, MD;  Location: [facility] OR;  Service: Orthopedics;  Laterality: N/A;   colonscopy  2021   WISDOM TOOTH EXTRACTION Left 2020   Left lower   Patient Active Problem List   Diagnosis Date Noted   Spinal stenosis of cervical region    Cervical disc herniation    Other spondylosis with myelopathy, cervical region    S/P cervical spinal fusion 08/02/2021   Anxiety 06/06/2021   Bipolar disorder (HCC) 06/06/2021   Chronic fatigue syndrome 06/06/2021   Fibromyalgia 06/06/2021   Constipation 06/06/2021   Insomnia 06/06/2021   Panic disorder 06/06/2021   Gait abnormality 06/06/2021   Neck pain 06/06/2021    REFERRING PROVIDER: Erick Colace, MD   REFERRING DIAG: M79.7 (ICD-10-CM) - Fibromyalgia   Rationale for Evaluation and Treatment: Rehabilitation  THERAPY DIAG:  No diagnosis found.  ONSET DATE: 2018   SUBJECTIVE:                                                                                                                                                                                           SUBJECTIVE STATEMENT:  Pt reports he is not doing well today. He has increased tightness and pain in neck, shoulder, and low back today. States this started mid day yesterday. Unsure of cause.   FROM EVAL: Increasing issues since 2018. Went to PT and it seemed to help. Seemed ot get worse over the pandemic. Went to rheumatology due to other symptoms happening. No dx came from those  visits. In 2022 was dx with fibromyalgia. ACDF Aug 2023. It took a lot longer to heal from that than I expected. From 2021 and on I was dropping things and things were too heavy but a lot of it has gone away.  After too much lifting or 10+lb and arms will give. Most nerves are fine but there was one on both side that were limited in NCV testing.  Skin hypersensitivity that can be irritated by certain clothes.    PERTINENT HISTORY:  H/o migraines-sweatband and rams horn pattern  PAIN:  Are you having pain? Yes: NPRS scale: 7/10  Pain location: cervical to shoulders Pain description: achy, sharp with certain motions Aggravating factors: never know Relieving factors: hot shower (sometimes), CBD, rotate hot and cold, massage  PRECAUTIONS:  None  RED FLAGS: None   WEIGHT BEARING RESTRICTIONS:  No  FALLS:  Has patient fallen in last 6 months?  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    OBJECTIVE:   DIAGNOSTIC FINDINGS:  X-ray of the cervical spine taken on 08/17/2022 was independently reviewed  and interpreted, showing C4-7 ACDF.  Allografts are in appropriate position. Anterior plate and screws are in good position. There is no lucency around the screws or the interbody cages. No fracture or dislocation seen.  From MD note: His EMG does not show any signs of neuropathy or ulnar neuropathy. He does have decreased recruitment in tricep muscles bilaterally but no significant tricep weakness.  PATIENT SURVEYS:  UEFS eval: 46  UEFI: 49/80  COGNITIVE STATUS: Within functional limits for tasks assessed    POSTURE:  increased lumbar lordosis  HAND DOMINANCE:  Right   Body Part #1 Cervical  PALPATION: Eval: more spasm noted in Lt upper traps  UE MMT UPPER EXTREMITY MMT:    MMT (lb) Right eval Left eval Rt/Lt 11/19 Rt/Lt 1/17  Shoulder flexion 12.8 12.4 16.9/14.1 12.0/15.6  Shoulder extension      Shoulder abduction      Shoulder adduction      Shoulder extension      Shoulder internal rotation      Shoulder external rotation 11.1 7.7 7.3/11.6 19.1/16.6  Middle trapezius      Lower trapezius      Elbow flexion      Elbow extension       (Blank rows = not tested)  1/8 Too painful into UE to test       Body Part #2 Hip    LE MMT LOWER EXTREMITY MMT:    MMT Right eval Left eval R /L 10/24  Hip flexion 19.5 19.8 43.9 / 42.6  Hip extension     Hip abduction 23.2 26.5 29.0 / 27.9  Hip adduction     Hip internal rotation     Hip external rotation     Knee flexion     Knee extension 26.4 31.3 43.2 / 43.9  Ankle dorsiflexion     Ankle plantarflexion     Ankle inversion     Ankle eversion      (Blank rows = not tested)  1/8 Too painful to test LE at today's visit    FUNCTIONAL TESTS:  EVAL: 30 seconds chair stand test 5;    places right foot under chair to press up, does not use UEs   10/26/22: 30s sts: 9    01/09/22 10 with UE   TREATMENT:      Treatment                            02/22/2023: Blank lines following charge title = not provided on this treatment date.   Manual:  TPDN No  There-ex: Supine cane flexion 2x5 Quadruped "cat" stretch x10 Quadruped thread needle stretch x5bil Seated cervical AROM 5" hold x10ea Seated shoulder rolls x10 Seated lumbar flexion stretch 10sec x5 Supine chest  press 3# 2x10 Supine active flexion 2x5 ea Posterior capsule stretch 15sec x3ea There-Act:  Self Care:  Nuro-Re-ed:  Gait Training:    Treatment                            02/13/2023: Blank lines following charge title = not provided on this treatment date.   Manual:  TPDN No  There-ex: Supine cane flexion 2x5 Quadruped "cat" stretch x10 Quadruped thread needle stretch x5bil Seated cervical AROM 5" hold  x10ea Seated shoulder rolls x10 Seated lumbar flexion stretch 10sec x5 Supine chest press 3# 2x10 Supine active flexion 2x5 ea Posterior capsule stretch 15sec x3ea There-Act:  Self Care:  Nuro-Re-ed:  Gait Training:      Treatment                            02/01/23: Quadruped "cat" stretch x10 Quadruped thread needle stretch x5bil Supine ABC 1# for half, then 0# R x2, 2# L x2 Supine chest press 3# 2x10 Theraband row 2x10 Theraband extension GTB 2x10 PNF D2 RTB x10ea Wall push ups x10 Press out with RTB x10ea   Treatment                            02/01/23:  Cup reach to overhead shelf & remove with opposite UE Standing on airex: ball handoff in horiz abd flexion to 90, extension; biceps curls 1lb without full extension at  end range.  Step on/off of airex SLS   Treatment                            1/23:  See HEP Dicussion of desensitization.    Treatment                          1/17  STM to thoracic ps Instruction in theracane and use of tennis balls  Quadruped "cat" stretch x10 Quadruped thread needle stretch x5bil Supine active shoulder flexion 1# x15ea Supine rhythmic stabilization 30sec ea bil- 90deg flexion End range supine manually resisted flexion/extension PNF D2 supine x10ea                                                                                                                                 PATIENT EDUCATION:  Education details:exercise form/rationale Person educated: Patient Education method: Programmer, multimedia, Demonstration, Tactile cues, and Verbal cues Education comprehension: verbalized understanding, returned demonstration, verbal cues required, tactile cues required, and needs further education  HOME EXERCISE PROGRAM: Walking program  Research counselor/psych with chronic pain/fibromyalgia specialty Land: [email or web address]  Aquatic This aquatic home exercise program from [employer] utilizes pictures from land based exercises, but has  been adapted prior to lamination and issuance.  Access Code: [email or web address] URL: [email or web address]  ASSESSMENT:  CLINICAL IMPRESSION:  Incorporated additional ROM and stretching interventions today to address stiffness and increased pain level. He regressed with strengthening exercises, requiring additional rest breaks. Demonstrated increased fatigue and poor endurance in bil Ues today. Instructed pt to work on HEP as tolerated between now and next session.   Re-cert/PN: Pt reports he has improved his sitting postures while sitting and working  and along with the aquatic PT has decreased overall pain, with higher or max pain sensitivity less frequent. He has had only a few decreases in pain to 2-3/10 with ~5/10 being the average.  He has increased his walking (program) to 3 x daily 1/2 mile which appears to have some relation to his recent slight increase in pain. He is advised to complete just 3 x per week rather than daily.  As in chart above he has had a significant improvement in LE strength.  He has had good tolerance to aquatic intervention and has reached his max potential in setting.  He will return for 1 more visit to ensure indep with aquatic final HEP then return to land intervention for remainder of this certification to progress toward lifting and UE strength goals and add more focused attention to cervicalgia and shoulder discomfort.       REHAB POTENTIAL: Fair diagnosis  CLINICAL DECISION MAKING: Evolving/moderate complexity  EVALUATION COMPLEXITY: Low   GOALS: Goals reviewed with patient? Yes  SHORT TERM GOALS: Target date: 9/14  Pt will be performing walking program on a daily basis with understanding of rest breaks/modifications necessary Baseline: Goal status: Met 09/27/22    LONG TERM GOALS: Target date:POC DATE  UEFI to improve by MDC Baseline:  Goal status: ongoing  2.  Each MMT via hand held dynamometry to increase by 10+lb Baseline:   Goal status: ongoing   3.  Able to lift objects <10lb throughout his day without dropping them or arms "giving out" Baseline:  Goal status:achieved  4.  Pt will verbalize increased tolerance to work activities and postures necessary Baseline:  Goal status: Met 10/26/22  5. Pt will be able to demonstrate/report ability to sit/stand/sleep for extended periods of time without pain in order to demonstrate functional improvement and tolerance to static positioning.    Goal status:ongoin      PLAN:  PT FREQUENCY: 1-2x/week  PT DURATION: 5 weeks  PLANNED INTERVENTIONS: Therapeutic exercises, Therapeutic activity, Neuromuscular re-education, Balance training, Gait training, Patient/Family education, Self Care, Joint mobilization, Stair training, Aquatic Therapy, Dry Needling, Spinal mobilization, Cryotherapy, Moist heat, Taping, Ionotophoresis 4mg /ml Dexamethasone, Manual therapy, and Re-evaluation.  PLAN FOR NEXT SESSION: continue with pain management and progressive exercise     Donnel Saxon Faisal Stradling, PTA 02/22/2023, 1:30 PM

## 2023-02-22 NOTE — Telephone Encounter (Signed)
 Called pt regarding missed visit today. Pt did not answer and does not have VM set up.

## 2023-02-26 DIAGNOSIS — F902 Attention-deficit hyperactivity disorder, combined type: Secondary | ICD-10-CM | POA: Diagnosis not present

## 2023-02-26 DIAGNOSIS — F331 Major depressive disorder, recurrent, moderate: Secondary | ICD-10-CM | POA: Diagnosis not present

## 2023-02-26 DIAGNOSIS — F411 Generalized anxiety disorder: Secondary | ICD-10-CM | POA: Diagnosis not present

## 2023-02-27 ENCOUNTER — Encounter: Payer: Self-pay | Admitting: Physical Medicine & Rehabilitation

## 2023-02-27 ENCOUNTER — Encounter
Payer: BC Managed Care – PPO | Attending: Physical Medicine & Rehabilitation | Admitting: Physical Medicine & Rehabilitation

## 2023-02-27 VITALS — BP 120/88 | HR 112 | Ht 65.0 in | Wt 178.0 lb

## 2023-02-27 DIAGNOSIS — M797 Fibromyalgia: Secondary | ICD-10-CM | POA: Diagnosis not present

## 2023-02-27 NOTE — Progress Notes (Signed)
 Acupuncture treatment #1 Indication fibromyalgia with widespread body pain Needs place bilaterally at the following points LI-4---SP-6 estim 2.5 hz x LV 3 ST-36  Treatment time 25 min, needles removed by CMA Pt tolerated treatment well  Recommend weekly visit x 4 and if improving, can spread out to monthly visits

## 2023-02-27 NOTE — Patient Instructions (Signed)
 Acupuncture Acupuncture is a type of treatment that involves stimulating specific points on your body by inserting thin needles through your skin. Acupuncture is often used to treat pain, but it may also be used to help relieve other types of symptoms. Your health care provider may recommend acupuncture to help treat various conditions, such as: Migraine and tension headaches. Nausea and vomiting after a surgery or cancer treatment. Sudden or severe (acute) pain, or long-term (chronic) pain. Addiction. Acupuncture is based on traditional [place] medicine, which recognizes more than 2,000 points on the body that connect energy pathways (meridians) through the body. The goal in stimulating these points is to balance the physical, emotional, and mental energy in your body. Acupuncture is done by a health care provider who has specialized training (licensed acupuncture practitioner). Treatment often requires several acupuncture sessions. You may have acupuncture along with other medical treatments. Tell a health care provider about: Any allergies you have. All medicines you are taking, including vitamins, herbs, eye drops, creams, and over-the-counter medicines. Any blood disorders you have. Any surgeries you have had. Any medical conditions you have. Whether you are pregnant or may be pregnant. What are the risks? Generally, this is a safe treatment. However, problems may occur, including: Skin infection. Damage to organs or structures that are under the skin if a needle is placed too deeply. This is rare. What happens before the treatment? Your acupuncture practitioner will ask about your medical history and your symptoms. You may have a physical exam. What happens during the treatment? The exact procedure will depend on your condition and how your acupuncture provider treats it. In general: Your skin will be cleaned with a germ-killing (antiseptic) solution. Your acupuncture practitioner will  open a new set of germ-free (sterile) needles. The needles will be gently inserted into your skin. They will be left in place for a certain amount of time. You may feel a tingling or burning sensation for a very short period of time. Your acupuncture practitioner may: Apply electrical energy to the needles. Adjust the needles in certain ways. After your procedure, the acupuncture practitioner will remove the needles, throw them away, and clean your skin. The procedure may vary among health care providers. What can I expect after the treatment? People react differently to acupuncture. Make sure you ask your acupuncture provider what to expect after your treatment. It is common to have: Minor bruising. Mild pain. A small amount of bleeding. Follow these instructions at home: Follow any instructions given by your provider after the treatment. Keep all follow-up visits. This is important. Where to find more information [facility]: [email or web address] Contact a health care provider if: You have questions about your reaction to the treatment. You have soreness. You have skin irritation or redness. You have a fever. Summary Acupuncture is a type of treatment that involves stimulating specific points on your body by inserting thin needles through your skin. This treatment is often used to treat pain, but it may also be used to help relieve other types of symptoms. The exact procedure will depend on your condition and how your acupuncture provider treats it. This information is not intended to replace advice given to you by your health care provider. Make sure you discuss any questions you have with your health care provider. Document Revised: 08/25/2020 Document Reviewed: 08/25/2020 Elsevier Patient Education  2024 [employer].

## 2023-03-01 ENCOUNTER — Ambulatory Visit (HOSPITAL_BASED_OUTPATIENT_CLINIC_OR_DEPARTMENT_OTHER): Payer: BC Managed Care – PPO | Admitting: Physical Therapy

## 2023-03-01 ENCOUNTER — Encounter (HOSPITAL_BASED_OUTPATIENT_CLINIC_OR_DEPARTMENT_OTHER): Payer: Self-pay | Admitting: Physical Therapy

## 2023-03-01 DIAGNOSIS — R252 Cramp and spasm: Secondary | ICD-10-CM

## 2023-03-01 DIAGNOSIS — M6281 Muscle weakness (generalized): Secondary | ICD-10-CM

## 2023-03-01 DIAGNOSIS — M542 Cervicalgia: Secondary | ICD-10-CM | POA: Diagnosis not present

## 2023-03-01 NOTE — Therapy (Signed)
 OUTPATIENT PHYSICAL THERAPY TREATMENT   Patient Name: Henry Jennings MRN: [id number] DOB:1970/10/27, 53 y.o., male Today's Date: 03/01/2023  END OF SESSION:  PT End of Session - 03/01/23 1347     Visit Number 20    Number of Visits 22    Date for PT Re-Evaluation 03/07/23    Authorization Time Period 1/27-3/27    Authorization - Visit Number 4    Authorization - Number of Visits 5    PT Start Time 1347    PT Stop Time 1430    PT Time Calculation (min) 43 min    Activity Tolerance Patient tolerated treatment well    Behavior During Therapy WFL for tasks assessed/performed                       Past Medical History:  Diagnosis Date   Depression    Herniated disc, cervical    Past Surgical History:  Procedure Laterality Date   ANTERIOR CERVICAL DECOMP/DISCECTOMY FUSION N/A 08/02/2021   Procedure: C4-5, C5-6, C6-7, C7-T1 ANTERIOR CERVICAL DISCECTOMY AND FUSION WITH PLATES AND SCREWS;  Surgeon: Kerrin Champagne, MD;  Location: [facility] OR;  Service: Orthopedics;  Laterality: N/A;   colonscopy  2021   WISDOM TOOTH EXTRACTION Left 2020   Left lower   Patient Active Problem List   Diagnosis Date Noted   Spinal stenosis of cervical region    Cervical disc herniation    Other spondylosis with myelopathy, cervical region    S/P cervical spinal fusion 08/02/2021   Anxiety 06/06/2021   Bipolar disorder (HCC) 06/06/2021   Chronic fatigue syndrome 06/06/2021   Fibromyalgia 06/06/2021   Constipation 06/06/2021   Insomnia 06/06/2021   Panic disorder 06/06/2021   Gait abnormality 06/06/2021   Neck pain 06/06/2021    REFERRING PROVIDER: Erick Colace, MD   REFERRING DIAG: M79.7 (ICD-10-CM) - Fibromyalgia   Rationale for Evaluation and Treatment: Rehabilitation  THERAPY DIAG:  Cervicalgia  Muscle weakness (generalized)  Cramp and spasm  ONSET DATE: 2018   SUBJECTIVE:                                                                                                                                                                                            SUBJECTIVE STATEMENT:  I feel like I can use the exercises to get a bad day down to a day that is not as bad. Not really getting many good days. I started acupuncture.   FROM EVAL: Increasing issues since 2018. Went to PT and it seemed to help. Seemed ot get worse over the pandemic. Went to rheumatology due  to other symptoms happening. No dx came from those visits. In 2022 was dx with fibromyalgia. ACDF Aug 2023. It took a lot longer to heal from that than I expected. From 2021 and on I was dropping things and things were too heavy but a lot of it has gone away.  After too much lifting or 10+lb and arms will give. Most nerves are fine but there was one on both side that were limited in NCV testing.  Skin hypersensitivity that can be irritated by certain clothes.    PERTINENT HISTORY:  H/o migraines-sweatband and rams horn pattern  PAIN:  Are you having pain? Yes: NPRS scale: 7/10 Pain location: cervical to shoulders Pain description: achy, sharp with certain motions Aggravating factors: never know Relieving factors: hot shower (sometimes), CBD, rotate hot and cold, massage  PRECAUTIONS:  None  RED FLAGS: None   WEIGHT BEARING RESTRICTIONS:  No  FALLS:  Has patient fallen in last 6 months?  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    OBJECTIVE:   DIAGNOSTIC FINDINGS:  X-ray of the cervical spine taken on 08/17/2022 was independently reviewed  and interpreted, showing C4-7 ACDF.  Allografts are in appropriate position. Anterior plate and screws are in good position. There is no lucency around the screws or the interbody cages. No fracture or dislocation seen.  From MD note: His EMG does  not show any signs of neuropathy or ulnar neuropathy. He does have decreased recruitment in tricep muscles bilaterally but no significant tricep weakness.  PATIENT SURVEYS:  UEFS eval: 46  UEFI: 49/80  COGNITIVE STATUS: Within functional limits for tasks assessed    POSTURE:  increased lumbar lordosis  HAND DOMINANCE:  Right   Body Part #1 Cervical  PALPATION: Eval: more spasm noted in Lt upper traps  UE MMT UPPER EXTREMITY MMT:   MMT (lb) Right eval Left eval Rt/Lt 11/19 Rt/Lt 1/17 Rt/Lt 2/27  Shoulder flexion 12.8 12.4 16.9/14.1 12.0/15.6 14.1/13.0  Shoulder extension       Shoulder abduction       Shoulder adduction       Shoulder extension       Shoulder internal rotation       Shoulder external rotation 11.1 7.7 7.3/11.6 19.1/16.6 16.0/17.3  Middle trapezius       Lower trapezius       Elbow flexion       Elbow extension        (Blank rows = not tested)  1/8 Too painful into UE to test       Body Part #2 Hip    LE MMT LOWER EXTREMITY MMT:    MMT Right eval Left eval R /L 10/24  Hip flexion 19.5 19.8 43.9 / 42.6  Hip extension     Hip abduction 23.2 26.5 29.0 / 27.9  Hip adduction     Hip internal rotation     Hip external rotation     Knee flexion     Knee extension 26.4 31.3 43.2 / 43.9  Ankle dorsiflexion     Ankle plantarflexion     Ankle inversion     Ankle eversion      (Blank rows = not tested)  1/8 Too painful to test LE at today's visit  FUNCTIONAL TESTS:  EVAL: 30 seconds chair stand test 5;    places right foot under chair to press up, does not use UEs   10/26/22: 30s sts: 9    01/09/22 10 with UE   TREATMENT:     Treatment                            2/27: Blank lines following charge title = not provided on this treatment date.   Manual:  TPDN No  There-ex: UBE fwd/back 2 min each Yellow weight ball on wall ABCs each arm.  There-Act:  Self Care:  Nuro-Re-ed:  Gait Training:     Treatment                             02/22/2023: Blank lines following charge title = not provided on this treatment date.   Manual:  TPDN No  There-ex: Supine cane flexion 2x5 Quadruped "cat" stretch x10 Quadruped thread needle stretch x5bil Seated cervical AROM 5" hold x10ea Seated shoulder rolls x10 Seated lumbar flexion stretch 10sec x5 Supine chest press 3# 2x10 Supine active flexion 2x5 ea Posterior capsule stretch 15sec x3ea There-Act:  Self Care:  Nuro-Re-ed:  Gait Training:    Treatment                            02/13/2023: Blank lines following charge title = not provided on this treatment date.   Manual:  TPDN No  There-ex: Supine cane flexion 2x5 Quadruped "cat" stretch x10 Quadruped thread needle stretch x5bil Seated cervical AROM 5" hold x10ea Seated shoulder rolls x10 Seated lumbar flexion stretch 10sec x5 Supine chest press 3# 2x10 Supine active flexion 2x5 ea Posterior capsule stretch 15sec x3ea There-Act:  Self Care: Anatomy of condition & progress Nuro-Re-ed:  Gait Training:      Treatment                            02/01/23: Quadruped "cat" stretch x10 Quadruped thread needle stretch x5bil Supine ABC 1# for half, then 0# R x2, 2# L x2 Supine chest press 3# 2x10 Theraband row 2x10 Theraband extension GTB 2x10 PNF D2 RTB x10ea Wall push ups x10 Press out with RTB x10ea                                                                                                                      PATIENT EDUCATION:  Education details:exercise form/rationale Person educated: Patient Education method: Programmer, multimedia, Facilities manager, Actor cues, and Verbal cues Education comprehension: verbalized understanding, returned demonstration, verbal cues required, tactile cues required, and needs further education  HOME EXERCISE PROGRAM: Walking program  Research counselor/psych with chronic pain/fibromyalgia specialty Land: [email or web address]  Aquatic This aquatic home  exercise program from [employer] utilizes pictures from land based exercises, but has been adapted prior  to lamination and issuance.  Access Code: [email or web address] URL: [email or web address]    ASSESSMENT:  CLINICAL IMPRESSION:  A large portion of our appointment today was spent discussing progress that has been made since beginning physical therapy.  He is now demonstrating longer periods of "good days" and able to utilize exercises and motions to decrease severe pain.  We discussed the importance of continuing the idea of regular mobility and that exercises will change and progress over time as tolerance improves.  Requested that he works at a good level with increased challenges only every 2 weeks rather than weekly to allow his body time to acclimate and respond to motions.  Patient has 1 more visit authorized with [place] and plans to join [place] well.  Will connect with fitness specialist that does his intake to ensure proper handoff.  Re-cert/PN: Pt reports he has improved his sitting postures while sitting and working  and along with the aquatic PT has decreased overall pain, with higher or max pain sensitivity less frequent. He has had only a few decreases in pain to 2-3/10 with ~5/10 being the average.  He has increased his walking (program) to 3 x daily 1/2 mile which appears to have some relation to his recent slight increase in pain. He is advised to complete just 3 x per week rather than daily.  As in chart above he has had a significant improvement in LE strength.  He has had good tolerance to aquatic intervention and has reached his max potential in setting.  He will return for 1 more visit to ensure indep with aquatic final HEP then return to land intervention for remainder of this certification to progress toward lifting and UE strength goals and add more focused attention to cervicalgia and shoulder discomfort.       REHAB POTENTIAL: Fair diagnosis  CLINICAL DECISION MAKING:  Evolving/moderate complexity  EVALUATION COMPLEXITY: Low   GOALS: Goals reviewed with patient? Yes  SHORT TERM GOALS: Target date: 9/14  Pt will be performing walking program on a daily basis with understanding of rest breaks/modifications necessary Baseline: Goal status: Met 09/27/22    LONG TERM GOALS: Target date:POC DATE  UEFI to improve by MDC Baseline:  Goal status: ongoing  2.  Each MMT via hand held dynamometry to increase by 10+lb Baseline:  Goal status: ongoing   3.  Able to lift objects <10lb throughout his day without dropping them or arms "giving out" Baseline:  Goal status:achieved  4.  Pt will verbalize increased tolerance to work activities and postures necessary Baseline:  Goal status: Met 10/26/22  5. Pt will be able to demonstrate/report ability to sit/stand/sleep for extended periods of time without pain in order to demonstrate functional improvement and tolerance to static positioning.    Goal status:ongoin      PLAN:  PT FREQUENCY: 1-2x/week  PT DURATION: 5 weeks  PLANNED INTERVENTIONS: Therapeutic exercises, Therapeutic activity, Neuromuscular re-education, Balance training, Gait training, Patient/Family education, Self Care, Joint mobilization, Stair training, Aquatic Therapy, Dry Needling, Spinal mobilization, Cryotherapy, Moist heat, Taping, Ionotophoresis 4mg /ml Dexamethasone, Manual therapy, and Re-evaluation.  PLAN FOR NEXT SESSION: continue with pain management and progressive exercise     Swan [place] C. Baily Serpe PT, DPT 03/01/23 4:29 PM

## 2023-03-05 DIAGNOSIS — F4312 Post-traumatic stress disorder, chronic: Secondary | ICD-10-CM | POA: Diagnosis not present

## 2023-03-05 DIAGNOSIS — F332 Major depressive disorder, recurrent severe without psychotic features: Secondary | ICD-10-CM | POA: Diagnosis not present

## 2023-03-05 DIAGNOSIS — F411 Generalized anxiety disorder: Secondary | ICD-10-CM | POA: Diagnosis not present

## 2023-03-05 DIAGNOSIS — F331 Major depressive disorder, recurrent, moderate: Secondary | ICD-10-CM | POA: Diagnosis not present

## 2023-03-05 DIAGNOSIS — F902 Attention-deficit hyperactivity disorder, combined type: Secondary | ICD-10-CM | POA: Diagnosis not present

## 2023-03-06 ENCOUNTER — Encounter
Payer: BC Managed Care – PPO | Attending: Physical Medicine & Rehabilitation | Admitting: Physical Medicine & Rehabilitation

## 2023-03-06 ENCOUNTER — Encounter: Payer: Self-pay | Admitting: Physical Medicine & Rehabilitation

## 2023-03-06 VITALS — BP 133/91 | HR 103 | Ht 65.0 in | Wt 178.0 lb

## 2023-03-06 DIAGNOSIS — M961 Postlaminectomy syndrome, not elsewhere classified: Secondary | ICD-10-CM | POA: Diagnosis not present

## 2023-03-06 DIAGNOSIS — M797 Fibromyalgia: Secondary | ICD-10-CM | POA: Diagnosis not present

## 2023-03-06 DIAGNOSIS — Z981 Arthrodesis status: Secondary | ICD-10-CM | POA: Insufficient documentation

## 2023-03-06 NOTE — Progress Notes (Signed)
 Acupuncture treatment #2 Indication fibromyalgia with widespread body pain Needs place bilaterally at the following points LI-4---SP-6 estim 2.5 hz x LV 3 ST-36  Treatment time 25 min, needles removed by CMA Pt tolerated treatment well  Recommend weekly visit x 4 and if improving, can spread out to monthly visits

## 2023-03-06 NOTE — Patient Instructions (Signed)
 Acupuncture Acupuncture is a type of treatment that involves stimulating specific points on your body by inserting thin needles through your skin. Acupuncture is often used to treat pain, but it may also be used to help relieve other types of symptoms. Your health care provider may recommend acupuncture to help treat various conditions, such as: Migraine and tension headaches. Nausea and vomiting after a surgery or cancer treatment. Sudden or severe (acute) pain, or long-term (chronic) pain. Addiction. Acupuncture is based on traditional [place] medicine, which recognizes more than 2,000 points on the body that connect energy pathways (meridians) through the body. The goal in stimulating these points is to balance the physical, emotional, and mental energy in your body. Acupuncture is done by a health care provider who has specialized training (licensed acupuncture practitioner). Treatment often requires several acupuncture sessions. You may have acupuncture along with other medical treatments. Tell a health care provider about: Any allergies you have. All medicines you are taking, including vitamins, herbs, eye drops, creams, and over-the-counter medicines. Any blood disorders you have. Any surgeries you have had. Any medical conditions you have. Whether you are pregnant or may be pregnant. What are the risks? Generally, this is a safe treatment. However, problems may occur, including: Skin infection. Damage to organs or structures that are under the skin if a needle is placed too deeply. This is rare. What happens before the treatment? Your acupuncture practitioner will ask about your medical history and your symptoms. You may have a physical exam. What happens during the treatment? The exact procedure will depend on your condition and how your acupuncture provider treats it. In general: Your skin will be cleaned with a germ-killing (antiseptic) solution. Your acupuncture practitioner will  open a new set of germ-free (sterile) needles. The needles will be gently inserted into your skin. They will be left in place for a certain amount of time. You may feel a tingling or burning sensation for a very short period of time. Your acupuncture practitioner may: Apply electrical energy to the needles. Adjust the needles in certain ways. After your procedure, the acupuncture practitioner will remove the needles, throw them away, and clean your skin. The procedure may vary among health care providers. What can I expect after the treatment? People react differently to acupuncture. Make sure you ask your acupuncture provider what to expect after your treatment. It is common to have: Minor bruising. Mild pain. A small amount of bleeding. Follow these instructions at home: Follow any instructions given by your provider after the treatment. Keep all follow-up visits. This is important. Where to find more information [facility]: [email or web address] Contact a health care provider if: You have questions about your reaction to the treatment. You have soreness. You have skin irritation or redness. You have a fever. Summary Acupuncture is a type of treatment that involves stimulating specific points on your body by inserting thin needles through your skin. This treatment is often used to treat pain, but it may also be used to help relieve other types of symptoms. The exact procedure will depend on your condition and how your acupuncture provider treats it. This information is not intended to replace advice given to you by your health care provider. Make sure you discuss any questions you have with your health care provider. Document Revised: 08/25/2020 Document Reviewed: 08/25/2020 Elsevier Patient Education  2024 [employer].

## 2023-03-12 DIAGNOSIS — F902 Attention-deficit hyperactivity disorder, combined type: Secondary | ICD-10-CM | POA: Diagnosis not present

## 2023-03-12 DIAGNOSIS — F331 Major depressive disorder, recurrent, moderate: Secondary | ICD-10-CM | POA: Diagnosis not present

## 2023-03-12 DIAGNOSIS — F411 Generalized anxiety disorder: Secondary | ICD-10-CM | POA: Diagnosis not present

## 2023-03-13 ENCOUNTER — Encounter: Payer: BC Managed Care – PPO | Admitting: Physical Medicine & Rehabilitation

## 2023-03-13 ENCOUNTER — Encounter: Payer: Self-pay | Admitting: Physical Medicine & Rehabilitation

## 2023-03-13 VITALS — BP 129/87 | HR 106 | Ht 65.0 in | Wt 179.4 lb

## 2023-03-13 DIAGNOSIS — M797 Fibromyalgia: Secondary | ICD-10-CM

## 2023-03-13 DIAGNOSIS — M961 Postlaminectomy syndrome, not elsewhere classified: Secondary | ICD-10-CM | POA: Diagnosis not present

## 2023-03-13 DIAGNOSIS — Z981 Arthrodesis status: Secondary | ICD-10-CM | POA: Diagnosis not present

## 2023-03-13 NOTE — Progress Notes (Signed)
 Acupuncture treatment #3 Indication fibromyalgia with widespread body pain Needs place bilaterally at the following points LI-4---SP-6 estim 2.5 hz x LV 3 ST-36  Treatment time 25 min, needles removed by CMA Pt tolerated treatment well  Recommend weekly visit x 4 and if improving, can spread out to monthly visits

## 2023-03-22 ENCOUNTER — Ambulatory Visit (HOSPITAL_BASED_OUTPATIENT_CLINIC_OR_DEPARTMENT_OTHER): Payer: BC Managed Care – PPO | Admitting: Physical Therapy

## 2023-03-26 DIAGNOSIS — F411 Generalized anxiety disorder: Secondary | ICD-10-CM | POA: Diagnosis not present

## 2023-03-26 DIAGNOSIS — F902 Attention-deficit hyperactivity disorder, combined type: Secondary | ICD-10-CM | POA: Diagnosis not present

## 2023-03-26 DIAGNOSIS — F331 Major depressive disorder, recurrent, moderate: Secondary | ICD-10-CM | POA: Diagnosis not present

## 2023-03-27 ENCOUNTER — Encounter: Payer: Self-pay | Admitting: Physical Medicine & Rehabilitation

## 2023-03-27 ENCOUNTER — Encounter: Payer: BC Managed Care – PPO | Admitting: Physical Medicine & Rehabilitation

## 2023-03-27 VITALS — BP 129/84 | HR 97 | Ht 65.0 in | Wt 179.0 lb

## 2023-03-27 DIAGNOSIS — M797 Fibromyalgia: Secondary | ICD-10-CM | POA: Diagnosis not present

## 2023-03-27 DIAGNOSIS — M961 Postlaminectomy syndrome, not elsewhere classified: Secondary | ICD-10-CM | POA: Insufficient documentation

## 2023-03-27 DIAGNOSIS — Z981 Arthrodesis status: Secondary | ICD-10-CM

## 2023-03-27 NOTE — Progress Notes (Signed)
 Acupuncture treatment #4 Indication fibromyalgia with widespread body pain Needs place bilaterally at the following points Bilateral GB 21 point, SI 14 with e-stim at 2.5 Hz between these 2 points bilaterally Also GV 14 Treatment time 25 min, needles removed by CMA Pt tolerated treatment well  Will do reassessment after this visit and then decide on further treatment

## 2023-03-27 NOTE — Patient Instructions (Signed)
 Acupuncture Acupuncture is a type of treatment that involves stimulating specific points on your body by inserting thin needles through your skin. Acupuncture is often used to treat pain, but it may also be used to help relieve other types of symptoms. Your health care provider may recommend acupuncture to help treat various conditions, such as: Migraine and tension headaches. Nausea and vomiting after a surgery or cancer treatment. Sudden or severe (acute) pain, or long-term (chronic) pain. Addiction. Acupuncture is based on traditional [place] medicine, which recognizes more than 2,000 points on the body that connect energy pathways (meridians) through the body. The goal in stimulating these points is to balance the physical, emotional, and mental energy in your body. Acupuncture is done by a health care provider who has specialized training (licensed acupuncture practitioner). Treatment often requires several acupuncture sessions. You may have acupuncture along with other medical treatments. Tell a health care provider about: Any allergies you have. All medicines you are taking, including vitamins, herbs, eye drops, creams, and over-the-counter medicines. Any blood disorders you have. Any surgeries you have had. Any medical conditions you have. Whether you are pregnant or may be pregnant. What are the risks? Generally, this is a safe treatment. However, problems may occur, including: Skin infection. Damage to organs or structures that are under the skin if a needle is placed too deeply. This is rare. What happens before the treatment? Your acupuncture practitioner will ask about your medical history and your symptoms. You may have a physical exam. What happens during the treatment? The exact procedure will depend on your condition and how your acupuncture provider treats it. In general: Your skin will be cleaned with a germ-killing (antiseptic) solution. Your acupuncture practitioner will  open a new set of germ-free (sterile) needles. The needles will be gently inserted into your skin. They will be left in place for a certain amount of time. You may feel a tingling or burning sensation for a very short period of time. Your acupuncture practitioner may: Apply electrical energy to the needles. Adjust the needles in certain ways. After your procedure, the acupuncture practitioner will remove the needles, throw them away, and clean your skin. The procedure may vary among health care providers. What can I expect after the treatment? People react differently to acupuncture. Make sure you ask your acupuncture provider what to expect after your treatment. It is common to have: Minor bruising. Mild pain. A small amount of bleeding. Follow these instructions at home: Follow any instructions given by your provider after the treatment. Keep all follow-up visits. This is important. Where to find more information [facility]: [email or web address] Contact a health care provider if: You have questions about your reaction to the treatment. You have soreness. You have skin irritation or redness. You have a fever. Summary Acupuncture is a type of treatment that involves stimulating specific points on your body by inserting thin needles through your skin. This treatment is often used to treat pain, but it may also be used to help relieve other types of symptoms. The exact procedure will depend on your condition and how your acupuncture provider treats it. This information is not intended to replace advice given to you by your health care provider. Make sure you discuss any questions you have with your health care provider. Document Revised: 08/25/2020 Document Reviewed: 08/25/2020 Elsevier Patient Education  2024 [employer].

## 2023-03-29 ENCOUNTER — Encounter: Payer: BC Managed Care – PPO | Admitting: Physical Medicine & Rehabilitation

## 2023-03-29 ENCOUNTER — Encounter: Payer: Self-pay | Admitting: Physical Medicine & Rehabilitation

## 2023-03-29 VITALS — BP 129/88 | HR 94 | Ht 65.0 in | Wt 179.0 lb

## 2023-03-29 DIAGNOSIS — Z981 Arthrodesis status: Secondary | ICD-10-CM | POA: Diagnosis not present

## 2023-03-29 DIAGNOSIS — M961 Postlaminectomy syndrome, not elsewhere classified: Secondary | ICD-10-CM

## 2023-03-29 DIAGNOSIS — M797 Fibromyalgia: Secondary | ICD-10-CM

## 2023-03-29 NOTE — Patient Instructions (Signed)
 Acupuncture Acupuncture is a type of treatment that involves stimulating specific points on your body by inserting thin needles through your skin. Acupuncture is often used to treat pain, but it may also be used to help relieve other types of symptoms. Your health care provider may recommend acupuncture to help treat various conditions, such as: Migraine and tension headaches. Nausea and vomiting after a surgery or cancer treatment. Sudden or severe (acute) pain, or long-term (chronic) pain. Addiction. Acupuncture is based on traditional [place] medicine, which recognizes more than 2,000 points on the body that connect energy pathways (meridians) through the body. The goal in stimulating these points is to balance the physical, emotional, and mental energy in your body. Acupuncture is done by a health care provider who has specialized training (licensed acupuncture practitioner). Treatment often requires several acupuncture sessions. You may have acupuncture along with other medical treatments. Tell a health care provider about: Any allergies you have. All medicines you are taking, including vitamins, herbs, eye drops, creams, and over-the-counter medicines. Any blood disorders you have. Any surgeries you have had. Any medical conditions you have. Whether you are pregnant or may be pregnant. What are the risks? Generally, this is a safe treatment. However, problems may occur, including: Skin infection. Damage to organs or structures that are under the skin if a needle is placed too deeply. This is rare. What happens before the treatment? Your acupuncture practitioner will ask about your medical history and your symptoms. You may have a physical exam. What happens during the treatment? The exact procedure will depend on your condition and how your acupuncture provider treats it. In general: Your skin will be cleaned with a germ-killing (antiseptic) solution. Your acupuncture practitioner will  open a new set of germ-free (sterile) needles. The needles will be gently inserted into your skin. They will be left in place for a certain amount of time. You may feel a tingling or burning sensation for a very short period of time. Your acupuncture practitioner may: Apply electrical energy to the needles. Adjust the needles in certain ways. After your procedure, the acupuncture practitioner will remove the needles, throw them away, and clean your skin. The procedure may vary among health care providers. What can I expect after the treatment? People react differently to acupuncture. Make sure you ask your acupuncture provider what to expect after your treatment. It is common to have: Minor bruising. Mild pain. A small amount of bleeding. Follow these instructions at home: Follow any instructions given by your provider after the treatment. Keep all follow-up visits. This is important. Where to find more information [facility]: [email or web address] Contact a health care provider if: You have questions about your reaction to the treatment. You have soreness. You have skin irritation or redness. You have a fever. Summary Acupuncture is a type of treatment that involves stimulating specific points on your body by inserting thin needles through your skin. This treatment is often used to treat pain, but it may also be used to help relieve other types of symptoms. The exact procedure will depend on your condition and how your acupuncture provider treats it. This information is not intended to replace advice given to you by your health care provider. Make sure you discuss any questions you have with your health care provider. Document Revised: 08/25/2020 Document Reviewed: 08/25/2020 Elsevier Patient Education  2024 [employer].

## 2023-03-29 NOTE — Progress Notes (Signed)
 Subjective:    Patient ID: Henry Jennings, male    DOB: [birth year], 53 y.o.   MRN: [id number]  HPI 53 year old male with history of cervical myelopathy related to cervical stenosis status post multilevel decompression performed by Dr. Otelia Sergeant several years ago.  He still has some residual shoulder and neck pain.  In addition he has been getting some intermittent twitching of his muscles in the left upper extremity left lower limb.  Usually gets this in the evening. He is taking tizanidine for spasticity and takes 8 mg around 8 AM and again at 2 PM but does not take anything in the evening. He also has a history of fibromyalgia with diffuse body pain. He has had for acupuncture treatments over the last month and reports having more energy generally less pain and the last treatment was more focused upon the neck and shoulder area and this was also helpful for pain in those particular areas.  Pain Inventory Average Pain 7 Pain Right Now 4 My pain is aching  In the last 24 hours, has pain interfered with the following? General activity 0 Relation with others 0 Enjoyment of life 0 What TIME of day is your pain at its worst? varies Sleep (in general) Good  Pain is worse with: some activites Pain improves with: heat/ice, medication, and injections Relief from Meds: 5  No family history on file. Social History   Socioeconomic History   Marital status: Married    Spouse name: [name]   Number of children: Not on file   Years of education: Not on file   Highest education level: Not on file  Occupational History   Not on file  Tobacco Use   Smoking status: Never   Smokeless tobacco: Never  Vaping Use   Vaping status: Never Used  Substance and Sexual Activity   Alcohol use: Not Currently   Drug use: Never   Sexual activity: Yes  Other Topics Concern   Not on file  Social History Narrative   Not on file   Social Drivers of Health   Financial Resource Strain: Not on file  Food  Insecurity: Not on file  Transportation Needs: Not on file  Physical Activity: Not on file  Stress: Not on file  Social Connections: Not on file   Past Surgical History:  Procedure Laterality Date   ANTERIOR CERVICAL DECOMP/DISCECTOMY FUSION N/A 08/02/2021   Procedure: C4-5, C5-6, C6-7, C7-T1 ANTERIOR CERVICAL DISCECTOMY AND FUSION WITH PLATES AND SCREWS;  Surgeon: Kerrin Champagne, MD;  Location: [facility] OR;  Service: Orthopedics;  Laterality: N/A;   colonscopy  2021   WISDOM TOOTH EXTRACTION Left 2020   Left lower   Past Surgical History:  Procedure Laterality Date   ANTERIOR CERVICAL DECOMP/DISCECTOMY FUSION N/A 08/02/2021   Procedure: C4-5, C5-6, C6-7, C7-T1 ANTERIOR CERVICAL DISCECTOMY AND FUSION WITH PLATES AND SCREWS;  Surgeon: Kerrin Champagne, MD;  Location: [facility] OR;  Service: Orthopedics;  Laterality: N/A;   colonscopy  2021   WISDOM TOOTH EXTRACTION Left 2020   Left lower   Past Medical History:  Diagnosis Date   Depression    Herniated disc, cervical    BP 129/88   Pulse 94   Ht 5\' 5"  (1.651 m)   Wt 179 lb (81.2 kg)   SpO2 95%   BMI 29.79 kg/m   Opioid Risk Score:   Fall Risk Score:  `1  Depression screen [facility] 2/9     03/13/2023    1:21 PM 02/27/2023  11:52 AM 01/26/2023    1:31 PM 06/06/2022    2:23 PM 03/02/2022    2:07 PM 12/29/2021    2:37 PM 11/04/2021   11:53 AM  Depression screen PHQ 2/9  Decreased Interest 1 0 1 1 0 0 1  Down, Depressed, Hopeless 1 0 1 1 0 0 2  PHQ - 2 Score 2 0 2 2 0 0 3  Altered sleeping       1  Tired, decreased energy       3  Change in appetite       2  Feeling bad or failure about yourself        3  Trouble concentrating       1  Moving slowly or fidgety/restless       1  Suicidal thoughts       0  PHQ-9 Score       14     Review of Systems  Musculoskeletal:  Positive for myalgias.  All other systems reviewed and are negative.     Objective:   Physical Exam  General no distress Mood and affect appropriate Extremities  without edema Sensation equal bilateral C5-6-7 8 dermatomal distribution Deep tendon reflexes are normal bilateral upper extremities Motor strength is 5/5 bilateral deltoid bicep tricep grip       Assessment & Plan:  #1.  History of cervical myelopathy status post decompression.  He has had no change in his neurologic status I believe his spasticity is occurring at a time when the tizanidine has worn off.  We discussed possibility of increasing to 3 times daily.  Original prescription was for 4 mg 3 times daily he feels like he does better with 8 mg twice daily Consider increasing Tizanidine to 8mg  TID if Left upper and lower limb twitching increases   2.  Fibromyalgia syndrome as well as chronic pain related to above would recommend continuing acupuncture treatments on a monthly basis.  Will see if there is any insurance limitations on this treatment.

## 2023-04-02 DIAGNOSIS — F902 Attention-deficit hyperactivity disorder, combined type: Secondary | ICD-10-CM | POA: Diagnosis not present

## 2023-04-02 DIAGNOSIS — F411 Generalized anxiety disorder: Secondary | ICD-10-CM | POA: Diagnosis not present

## 2023-04-02 DIAGNOSIS — F331 Major depressive disorder, recurrent, moderate: Secondary | ICD-10-CM | POA: Diagnosis not present

## 2023-04-05 ENCOUNTER — Ambulatory Visit (HOSPITAL_BASED_OUTPATIENT_CLINIC_OR_DEPARTMENT_OTHER): Attending: Physical Medicine & Rehabilitation | Admitting: Physical Therapy

## 2023-04-05 ENCOUNTER — Encounter (HOSPITAL_BASED_OUTPATIENT_CLINIC_OR_DEPARTMENT_OTHER): Payer: Self-pay | Admitting: Physical Therapy

## 2023-04-05 DIAGNOSIS — M542 Cervicalgia: Secondary | ICD-10-CM

## 2023-04-05 DIAGNOSIS — M6281 Muscle weakness (generalized): Secondary | ICD-10-CM | POA: Diagnosis not present

## 2023-04-05 DIAGNOSIS — R252 Cramp and spasm: Secondary | ICD-10-CM | POA: Diagnosis not present

## 2023-04-05 NOTE — Therapy (Signed)
 OUTPATIENT PHYSICAL THERAPY TREATMENT/Discharge   Patient Name: Henry Jennings MRN: [id number] DOB:June 13, 1970, 53 y.o., male Today's Date: 04/07/2023  END OF SESSION:     PT End of Session - 04/07/23 1201     Visit Number 21    Number of Visits 22    Date for PT Re-Evaluation 04/05/23    Authorization Time Period 1/27-3/27    Authorization - Visit Number 5    Authorization - Number of Visits 5    PT Start Time 1435    PT Stop Time 1511    PT Time Calculation (min) 36 min    Activity Tolerance Patient tolerated treatment well    Behavior During Therapy WFL for tasks assessed/performed                   Past Medical History:  Diagnosis Date   Depression    Herniated disc, cervical    Past Surgical History:  Procedure Laterality Date   ANTERIOR CERVICAL DECOMP/DISCECTOMY FUSION N/A 08/02/2021   Procedure: C4-5, C5-6, C6-7, C7-T1 ANTERIOR CERVICAL DISCECTOMY AND FUSION WITH PLATES AND SCREWS;  Surgeon: Kerrin Champagne, MD;  Location: [facility] OR;  Service: Orthopedics;  Laterality: N/A;   colonscopy  2021   WISDOM TOOTH EXTRACTION Left 2020   Left lower   Patient Active Problem List   Diagnosis Date Noted   Postlaminectomy syndrome, cervical region 03/27/2023   Spinal stenosis of cervical region    Cervical disc herniation    Other spondylosis with myelopathy, cervical region    S/P cervical spinal fusion 08/02/2021   Anxiety 06/06/2021   Bipolar disorder (HCC) 06/06/2021   Chronic fatigue syndrome 06/06/2021   Fibromyalgia 06/06/2021   Constipation 06/06/2021   Insomnia 06/06/2021   Panic disorder 06/06/2021   Gait abnormality 06/06/2021   Neck pain 06/06/2021    REFERRING PROVIDER: Erick Colace, MD   REFERRING DIAG: M79.7 (ICD-10-CM) - Fibromyalgia   Rationale for Evaluation and Treatment: Rehabilitation  THERAPY DIAG:  Cervicalgia  Muscle weakness (generalized)  Cramp and spasm  ONSET DATE: 2018   SUBJECTIVE:                                                                                                                                                                                            SUBJECTIVE STATEMENT:  Planning to get family plan at [facility]. Exercises have helped me to be more mobile but the pain still goes up.   FROM EVAL: Increasing issues since 2018. Went to PT and it seemed to help. Seemed ot get worse over the pandemic. Went to rheumatology due to other symptoms happening.  No dx came from those visits. In 2022 was dx with fibromyalgia. ACDF Aug 2023. It took a lot longer to heal from that than I expected. From 2021 and on I was dropping things and things were too heavy but a lot of it has gone away.  After too much lifting or 10+lb and arms will give. Most nerves are fine but there was one on both side that were limited in NCV testing.  Skin hypersensitivity that can be irritated by certain clothes.    PERTINENT HISTORY:  H/o migraines-sweatband and rams horn pattern  PAIN:  Are you having pain? Yes: NPRS scale: 7/10 Pain location: cervical to shoulders Pain description: achy, sharp with certain motions Aggravating factors: never know Relieving factors: hot shower (sometimes), CBD, rotate hot and cold, massage  PRECAUTIONS:  None  RED FLAGS: None   WEIGHT BEARING RESTRICTIONS:  No  FALLS:  Has patient fallen in last 6 months?  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    OBJECTIVE:   DIAGNOSTIC FINDINGS:  X-ray of the cervical spine taken on 08/17/2022 was independently reviewed  and interpreted, showing C4-7 ACDF.  Allografts are in appropriate position. Anterior plate and screws are in good position. There is no lucency around the screws or the interbody cages. No fracture or dislocation seen.   From MD note: His EMG does not show any signs of neuropathy or ulnar neuropathy. He does have decreased recruitment in tricep muscles bilaterally but no significant tricep weakness.  PATIENT SURVEYS:  UEFS eval: 46  UEFI: 49/80  UEFI 4/3: 36/80  COGNITIVE STATUS: Within functional limits for tasks assessed    POSTURE:  increased lumbar lordosis  HAND DOMINANCE:  Right   Body Part #1 Cervical  PALPATION: Eval: more spasm noted in Lt upper traps  UE MMT UPPER EXTREMITY MMT:   MMT (lb) Right eval Left eval Rt/Lt 11/19 Rt/Lt 1/17 Rt/Lt 2/27 Rt/Lt 4/3  Shoulder flexion 12.8 12.4 16.9/14.1 12.0/15.6 14.1/13.0 12.0/13.1  Shoulder extension        Shoulder abduction        Shoulder adduction        Shoulder extension        Shoulder internal rotation        Shoulder external rotation 11.1 7.7 7.3/11.6 19.1/16.6 16.0/17.3 15.6/17.5  Middle trapezius        Lower trapezius        Elbow flexion        Elbow extension         (Blank rows = not tested)  1/8 Too painful into UE to test       Body Part #2 Hip    LE MMT LOWER EXTREMITY MMT:    MMT Right eval Left eval R /L 10/24 Rt/Lt 4/3  Hip flexion 19.5 19.8 43.9 / 42.6 44.8/42.8  Hip extension      Hip abduction 23.2 26.5 29.0 / 27.9 32.2/33.4  Hip adduction      Hip internal rotation      Hip external rotation      Knee flexion      Knee extension 26.4 31.3 43.2 / 43.9 44.9/51.8  Ankle dorsiflexion      Ankle plantarflexion      Ankle inversion  Ankle eversion       (Blank rows = not tested)  1/8 Too painful to test LE at today's visit    FUNCTIONAL TESTS:  EVAL: 30 seconds chair stand test 5;    places right foot under chair to press up, does not use UEs   10/26/22: 30s sts: 9    01/09/22 10 with UE     4/3 30s sit to stand: 7 without use of UEs  TREATMENT:     Treatment                            04/05/23: Blank lines following charge title = not provided on this treatment date.    Manual:  TPDN No  There-ex: Discussed Right Start program and progression of exercises- shorter exercises at higher frequency There-Act: Discussed self care on high pain days, pain journal for tracking, body awareness of pain levels/activity tolerance Self Care:  Nuro-Re-ed:  Gait Training:    Treatment                            2/27: Blank lines following charge title = not provided on this treatment date.   Manual:  TPDN No  There-ex: UBE fwd/back 2 min each Yellow weight ball on wall ABCs each arm.  There-Act:  Self Care:  Nuro-Re-ed:  Gait Training:                                                                                                                     PATIENT EDUCATION:  Education details:exercise form/rationale Person educated: Patient Education method: Programmer, multimedia, Demonstration, Actor cues, and Verbal cues Education comprehension: verbalized understanding, returned demonstration, verbal cues required, tactile cues required, and needs further education  HOME EXERCISE PROGRAM: Walking program  Research counselor/psych with chronic pain/fibromyalgia specialty Land: [email or web address]  Aquatic This aquatic home exercise program from [employer] utilizes pictures from land based exercises, but has been adapted prior to lamination and issuance.  Access Code: [email or web address] URL: [email or web address]    ASSESSMENT:  CLINICAL IMPRESSION:  Pt has made significant progress at this point and is prepared for d/c to gym program. Plans to join [employer] and utilize Right Start program for guided exercise progression. Will continue to work on awareness of pain levels and activity tolerance for decreased sensitivity.    Re-cert/PN: Pt reports he has improved his sitting postures while sitting and working  and along with the aquatic PT has decreased overall pain, with higher or max pain sensitivity less frequent. He has had only a few decreases in pain  to 2-3/10 with ~5/10 being the average.  He has increased his walking (program) to 3 x daily 1/2 mile which appears to have some relation to his recent slight increase in pain. He is advised to complete just 3 x per week rather than daily.  As in chart above he has had a significant improvement in LE strength.  He has had good tolerance to aquatic intervention and has reached his max potential in setting.  He will return for 1 more visit to ensure indep with aquatic final HEP then return to land intervention for remainder of this certification to progress toward lifting and UE strength goals and add more focused attention to cervicalgia and shoulder discomfort.       REHAB POTENTIAL: Fair diagnosis  CLINICAL DECISION MAKING: Evolving/moderate complexity  EVALUATION COMPLEXITY: Low   GOALS: Goals reviewed with patient? Yes  SHORT TERM GOALS: Target date: 9/14  Pt will be performing walking program on a daily basis with understanding of rest breaks/modifications necessary Baseline: Goal status: Met 09/27/22    LONG TERM GOALS: Target date:POC DATE  UEFI to improve by MDC Baseline:  Goal status: ongoing  2.  Each MMT via hand held dynamometry to increase by 10+lb Baseline:  Goal status: partially met- see obj   3.  Able to lift objects <10lb throughout his day without dropping them or arms "giving out" Baseline:  Goal status:achieved  4.  Pt will verbalize increased tolerance to work activities and postures necessary Baseline:  Goal status: Met 10/26/22  5. Pt will be able to demonstrate/report ability to sit/stand/sleep for extended periods of time without pain in order to demonstrate functional improvement and tolerance to static positioning.    Goal status:partially met, feels more mobile but still has high pain days.      PLAN:  PT FREQUENCY: 1-2x/week  PT DURATION: 5 weeks  PLANNED INTERVENTIONS: Therapeutic exercises, Therapeutic activity, Neuromuscular  re-education, Balance training, Gait training, Patient/Family education, Self Care, Joint mobilization, Stair training, Aquatic Therapy, Dry Needling, Spinal mobilization, Cryotherapy, Moist heat, Taping, Ionotophoresis 4mg /ml Dexamethasone, Manual therapy, and Re-evaluation.     Almas Rake C. Traveon Louro PT, DPT 04/07/23 12:01 PM

## 2023-04-07 ENCOUNTER — Encounter (HOSPITAL_BASED_OUTPATIENT_CLINIC_OR_DEPARTMENT_OTHER): Payer: Self-pay | Admitting: Physical Therapy

## 2023-04-16 DIAGNOSIS — F411 Generalized anxiety disorder: Secondary | ICD-10-CM | POA: Diagnosis not present

## 2023-04-16 DIAGNOSIS — F331 Major depressive disorder, recurrent, moderate: Secondary | ICD-10-CM | POA: Diagnosis not present

## 2023-04-16 DIAGNOSIS — F902 Attention-deficit hyperactivity disorder, combined type: Secondary | ICD-10-CM | POA: Diagnosis not present

## 2023-04-23 DIAGNOSIS — F411 Generalized anxiety disorder: Secondary | ICD-10-CM | POA: Diagnosis not present

## 2023-04-23 DIAGNOSIS — F331 Major depressive disorder, recurrent, moderate: Secondary | ICD-10-CM | POA: Diagnosis not present

## 2023-04-23 DIAGNOSIS — F902 Attention-deficit hyperactivity disorder, combined type: Secondary | ICD-10-CM | POA: Diagnosis not present

## 2023-04-24 ENCOUNTER — Encounter: Attending: Physical Medicine & Rehabilitation | Admitting: Physical Medicine & Rehabilitation

## 2023-04-24 ENCOUNTER — Encounter: Payer: Self-pay | Admitting: Physical Medicine & Rehabilitation

## 2023-04-24 VITALS — BP 113/76 | HR 78 | Ht 65.0 in | Wt 177.2 lb

## 2023-04-24 DIAGNOSIS — M797 Fibromyalgia: Secondary | ICD-10-CM | POA: Insufficient documentation

## 2023-04-24 DIAGNOSIS — M961 Postlaminectomy syndrome, not elsewhere classified: Secondary | ICD-10-CM | POA: Insufficient documentation

## 2023-04-24 NOTE — Progress Notes (Signed)
 Acupuncture treatment #5 Indication fibromyalgia with widespread body pain Needs place bilaterally at the following points Bilateral GB 21 point, SI 14 with e-stim at 2.5 Hz between these 2 points bilaterally Also GV 14 Treatment time 25 min, needles removed by CMA Pt tolerated treatment well

## 2023-04-30 DIAGNOSIS — F411 Generalized anxiety disorder: Secondary | ICD-10-CM | POA: Diagnosis not present

## 2023-04-30 DIAGNOSIS — F331 Major depressive disorder, recurrent, moderate: Secondary | ICD-10-CM | POA: Diagnosis not present

## 2023-04-30 DIAGNOSIS — F902 Attention-deficit hyperactivity disorder, combined type: Secondary | ICD-10-CM | POA: Diagnosis not present

## 2023-05-07 DIAGNOSIS — F411 Generalized anxiety disorder: Secondary | ICD-10-CM | POA: Diagnosis not present

## 2023-05-07 DIAGNOSIS — F902 Attention-deficit hyperactivity disorder, combined type: Secondary | ICD-10-CM | POA: Diagnosis not present

## 2023-05-07 DIAGNOSIS — F4312 Post-traumatic stress disorder, chronic: Secondary | ICD-10-CM | POA: Diagnosis not present

## 2023-05-07 DIAGNOSIS — F332 Major depressive disorder, recurrent severe without psychotic features: Secondary | ICD-10-CM | POA: Diagnosis not present

## 2023-05-07 DIAGNOSIS — F331 Major depressive disorder, recurrent, moderate: Secondary | ICD-10-CM | POA: Diagnosis not present

## 2023-05-14 DIAGNOSIS — F902 Attention-deficit hyperactivity disorder, combined type: Secondary | ICD-10-CM | POA: Diagnosis not present

## 2023-05-14 DIAGNOSIS — F411 Generalized anxiety disorder: Secondary | ICD-10-CM | POA: Diagnosis not present

## 2023-05-14 DIAGNOSIS — F331 Major depressive disorder, recurrent, moderate: Secondary | ICD-10-CM | POA: Diagnosis not present

## 2023-05-28 DIAGNOSIS — F411 Generalized anxiety disorder: Secondary | ICD-10-CM | POA: Diagnosis not present

## 2023-05-28 DIAGNOSIS — F331 Major depressive disorder, recurrent, moderate: Secondary | ICD-10-CM | POA: Diagnosis not present

## 2023-05-28 DIAGNOSIS — F902 Attention-deficit hyperactivity disorder, combined type: Secondary | ICD-10-CM | POA: Diagnosis not present

## 2023-06-04 DIAGNOSIS — F331 Major depressive disorder, recurrent, moderate: Secondary | ICD-10-CM | POA: Diagnosis not present

## 2023-06-04 DIAGNOSIS — F902 Attention-deficit hyperactivity disorder, combined type: Secondary | ICD-10-CM | POA: Diagnosis not present

## 2023-06-04 DIAGNOSIS — F411 Generalized anxiety disorder: Secondary | ICD-10-CM | POA: Diagnosis not present

## 2023-06-05 ENCOUNTER — Encounter: Payer: Self-pay | Admitting: Physical Medicine & Rehabilitation

## 2023-06-05 ENCOUNTER — Encounter: Attending: Physical Medicine & Rehabilitation | Admitting: Physical Medicine & Rehabilitation

## 2023-06-05 VITALS — BP 129/94 | HR 106 | Ht 65.0 in | Wt 177.2 lb

## 2023-06-05 DIAGNOSIS — M961 Postlaminectomy syndrome, not elsewhere classified: Secondary | ICD-10-CM | POA: Diagnosis not present

## 2023-06-05 NOTE — Progress Notes (Signed)
 Acupuncture treatment #6 Indication fibromyalgia with widespread body pain Needs place bilaterally at the following points Bilateral GB 21 point, SI 11 with e-stim at 2.5 Hz between these 2 points bilaterally Also GB 20 Treatment time 25 min, needles removed by CMA Pt tolerated treatment well

## 2023-06-11 DIAGNOSIS — F902 Attention-deficit hyperactivity disorder, combined type: Secondary | ICD-10-CM | POA: Diagnosis not present

## 2023-06-11 DIAGNOSIS — F411 Generalized anxiety disorder: Secondary | ICD-10-CM | POA: Diagnosis not present

## 2023-06-11 DIAGNOSIS — F331 Major depressive disorder, recurrent, moderate: Secondary | ICD-10-CM | POA: Diagnosis not present

## 2023-06-18 DIAGNOSIS — F331 Major depressive disorder, recurrent, moderate: Secondary | ICD-10-CM | POA: Diagnosis not present

## 2023-06-18 DIAGNOSIS — F902 Attention-deficit hyperactivity disorder, combined type: Secondary | ICD-10-CM | POA: Diagnosis not present

## 2023-06-18 DIAGNOSIS — F411 Generalized anxiety disorder: Secondary | ICD-10-CM | POA: Diagnosis not present

## 2023-06-25 DIAGNOSIS — F411 Generalized anxiety disorder: Secondary | ICD-10-CM | POA: Diagnosis not present

## 2023-06-25 DIAGNOSIS — F902 Attention-deficit hyperactivity disorder, combined type: Secondary | ICD-10-CM | POA: Diagnosis not present

## 2023-06-25 DIAGNOSIS — F331 Major depressive disorder, recurrent, moderate: Secondary | ICD-10-CM | POA: Diagnosis not present

## 2023-07-02 DIAGNOSIS — F411 Generalized anxiety disorder: Secondary | ICD-10-CM | POA: Diagnosis not present

## 2023-07-02 DIAGNOSIS — F902 Attention-deficit hyperactivity disorder, combined type: Secondary | ICD-10-CM | POA: Diagnosis not present

## 2023-07-02 DIAGNOSIS — F331 Major depressive disorder, recurrent, moderate: Secondary | ICD-10-CM | POA: Diagnosis not present

## 2023-07-09 DIAGNOSIS — F331 Major depressive disorder, recurrent, moderate: Secondary | ICD-10-CM | POA: Diagnosis not present

## 2023-07-09 DIAGNOSIS — F411 Generalized anxiety disorder: Secondary | ICD-10-CM | POA: Diagnosis not present

## 2023-07-09 DIAGNOSIS — F902 Attention-deficit hyperactivity disorder, combined type: Secondary | ICD-10-CM | POA: Diagnosis not present

## 2023-07-13 ENCOUNTER — Encounter: Payer: Self-pay | Admitting: Physical Medicine & Rehabilitation

## 2023-07-13 ENCOUNTER — Encounter: Attending: Physical Medicine & Rehabilitation | Admitting: Physical Medicine & Rehabilitation

## 2023-07-13 VITALS — BP 127/86 | HR 87 | Ht 65.0 in | Wt 176.4 lb

## 2023-07-13 DIAGNOSIS — M961 Postlaminectomy syndrome, not elsewhere classified: Secondary | ICD-10-CM | POA: Diagnosis not present

## 2023-07-13 NOTE — Progress Notes (Unsigned)
 Acupuncture treatment #7 Indication cervical post laminectomy syndrome Needs place bilaterally at the following points Bilateral GB 21 point, SI 11 with e-stim at 2.5 Hz between these 2 points bilaterally Also GV 14 Treatment time 30 min, needles removed by CMA Pt tolerated treatment well

## 2023-07-13 NOTE — Patient Instructions (Signed)
 Acupuncture Acupuncture is a type of treatment that involves stimulating specific points on your body by inserting thin needles through your skin. Acupuncture is often used to treat pain, but it may also be used to help relieve other types of symptoms. Your health care provider may recommend acupuncture to help treat various conditions, such as: Migraine and tension headaches. Nausea and vomiting after a surgery or cancer treatment. Sudden or severe (acute) pain, or long-term (chronic) pain. Addiction. Acupuncture is based on traditional [place] medicine, which recognizes more than 2,000 points on the body that connect energy pathways (meridians) through the body. The goal in stimulating these points is to balance the physical, emotional, and mental energy in your body. Acupuncture is done by a health care provider who has specialized training (licensed acupuncture practitioner). Treatment often requires several acupuncture sessions. You may have acupuncture along with other medical treatments. Tell a health care provider about: Any allergies you have. All medicines you are taking, including vitamins, herbs, eye drops, creams, and over-the-counter medicines. Any blood disorders you have. Any surgeries you have had. Any medical conditions you have. Whether you are pregnant or may be pregnant. What are the risks? Generally, this is a safe treatment. However, problems may occur, including: Skin infection. Damage to organs or structures that are under the skin if a needle is placed too deeply. This is rare. What happens before the treatment? Your acupuncture practitioner will ask about your medical history and your symptoms. You may have a physical exam. What happens during the treatment? The exact procedure will depend on your condition and how your acupuncture provider treats it. In general: Your skin will be cleaned with a germ-killing (antiseptic) solution. Your acupuncture practitioner will  open a new set of germ-free (sterile) needles. The needles will be gently inserted into your skin. They will be left in place for a certain amount of time. You may feel a tingling or burning sensation for a very short period of time. Your acupuncture practitioner may: Apply electrical energy to the needles. Adjust the needles in certain ways. After your procedure, the acupuncture practitioner will remove the needles, throw them away, and clean your skin. The procedure may vary among health care providers. What can I expect after the treatment? People react differently to acupuncture. Make sure you ask your acupuncture provider what to expect after your treatment. It is common to have: Minor bruising. Mild pain. A small amount of bleeding. Follow these instructions at home: Follow any instructions given by your provider after the treatment. Keep all follow-up visits. This is important. Where to find more information [facility]: [email or web address] Contact a health care provider if: You have questions about your reaction to the treatment. You have soreness. You have skin irritation or redness. You have a fever. Summary Acupuncture is a type of treatment that involves stimulating specific points on your body by inserting thin needles through your skin. This treatment is often used to treat pain, but it may also be used to help relieve other types of symptoms. The exact procedure will depend on your condition and how your acupuncture provider treats it. This information is not intended to replace advice given to you by your health care provider. Make sure you discuss any questions you have with your health care provider. Document Revised: 08/25/2020 Document Reviewed: 08/25/2020 Elsevier Patient Education  2024 [employer].

## 2023-07-23 DIAGNOSIS — F411 Generalized anxiety disorder: Secondary | ICD-10-CM | POA: Diagnosis not present

## 2023-07-23 DIAGNOSIS — F902 Attention-deficit hyperactivity disorder, combined type: Secondary | ICD-10-CM | POA: Diagnosis not present

## 2023-07-23 DIAGNOSIS — F331 Major depressive disorder, recurrent, moderate: Secondary | ICD-10-CM | POA: Diagnosis not present

## 2023-08-07 DIAGNOSIS — F902 Attention-deficit hyperactivity disorder, combined type: Secondary | ICD-10-CM | POA: Diagnosis not present

## 2023-08-07 DIAGNOSIS — F331 Major depressive disorder, recurrent, moderate: Secondary | ICD-10-CM | POA: Diagnosis not present

## 2023-08-07 DIAGNOSIS — F411 Generalized anxiety disorder: Secondary | ICD-10-CM | POA: Diagnosis not present

## 2023-08-13 DIAGNOSIS — F411 Generalized anxiety disorder: Secondary | ICD-10-CM | POA: Diagnosis not present

## 2023-08-13 DIAGNOSIS — F902 Attention-deficit hyperactivity disorder, combined type: Secondary | ICD-10-CM | POA: Diagnosis not present

## 2023-08-13 DIAGNOSIS — F331 Major depressive disorder, recurrent, moderate: Secondary | ICD-10-CM | POA: Diagnosis not present

## 2023-08-14 ENCOUNTER — Encounter: Payer: Self-pay | Admitting: Physical Medicine & Rehabilitation

## 2023-08-14 ENCOUNTER — Encounter: Attending: Physical Medicine & Rehabilitation | Admitting: Physical Medicine & Rehabilitation

## 2023-08-14 VITALS — BP 120/84 | HR 99 | Ht 65.0 in | Wt 176.0 lb

## 2023-08-14 DIAGNOSIS — Z981 Arthrodesis status: Secondary | ICD-10-CM | POA: Diagnosis not present

## 2023-08-14 NOTE — Progress Notes (Signed)
 Acupuncture treatment #8 Indication cervical post laminectomy syndrome Needs place bilaterally at the following points Bilateral GB 21 point, SI 11 with e-stim at 2.5 Hz between these 2 points bilaterally Also GV 14 Treatment time 30 min, needles removed by CMA Pt tolerated treatment well

## 2023-08-20 ENCOUNTER — Ambulatory Visit: Payer: BC Managed Care – PPO | Admitting: Orthopedic Surgery

## 2023-08-27 DIAGNOSIS — F411 Generalized anxiety disorder: Secondary | ICD-10-CM | POA: Diagnosis not present

## 2023-08-27 DIAGNOSIS — F902 Attention-deficit hyperactivity disorder, combined type: Secondary | ICD-10-CM | POA: Diagnosis not present

## 2023-08-27 DIAGNOSIS — F331 Major depressive disorder, recurrent, moderate: Secondary | ICD-10-CM | POA: Diagnosis not present

## 2023-09-10 DIAGNOSIS — F411 Generalized anxiety disorder: Secondary | ICD-10-CM | POA: Diagnosis not present

## 2023-09-10 DIAGNOSIS — F902 Attention-deficit hyperactivity disorder, combined type: Secondary | ICD-10-CM | POA: Diagnosis not present

## 2023-09-10 DIAGNOSIS — F331 Major depressive disorder, recurrent, moderate: Secondary | ICD-10-CM | POA: Diagnosis not present

## 2023-09-21 ENCOUNTER — Encounter: Payer: Self-pay | Admitting: Physical Medicine & Rehabilitation

## 2023-09-21 ENCOUNTER — Encounter: Attending: Physical Medicine & Rehabilitation | Admitting: Physical Medicine & Rehabilitation

## 2023-09-21 VITALS — BP 135/89 | HR 102 | Ht 65.0 in | Wt 180.8 lb

## 2023-09-21 DIAGNOSIS — M797 Fibromyalgia: Secondary | ICD-10-CM | POA: Diagnosis not present

## 2023-09-21 NOTE — Progress Notes (Signed)
 Acupuncture treatment #9 Indication fibromyalgia with widespread body pain Needs place bilaterally at the following points LI-4---SP-6 estim 2.5 hz x LV 3 ST-36  Treatment time 25 min, needles removed by CMA Pt tolerated treatment well  Recommend weekly visit x 4 and if improving, can spread out to monthly visits

## 2023-09-24 ENCOUNTER — Other Ambulatory Visit (INDEPENDENT_AMBULATORY_CARE_PROVIDER_SITE_OTHER): Payer: Self-pay

## 2023-09-24 ENCOUNTER — Ambulatory Visit (INDEPENDENT_AMBULATORY_CARE_PROVIDER_SITE_OTHER): Admitting: Orthopedic Surgery

## 2023-09-24 DIAGNOSIS — F902 Attention-deficit hyperactivity disorder, combined type: Secondary | ICD-10-CM | POA: Diagnosis not present

## 2023-09-24 DIAGNOSIS — Z981 Arthrodesis status: Secondary | ICD-10-CM

## 2023-09-24 DIAGNOSIS — F331 Major depressive disorder, recurrent, moderate: Secondary | ICD-10-CM | POA: Diagnosis not present

## 2023-09-24 DIAGNOSIS — F411 Generalized anxiety disorder: Secondary | ICD-10-CM | POA: Diagnosis not present

## 2023-09-24 NOTE — Progress Notes (Signed)
 Orthopedic Surgery Progress Note   Assessment: Patient is a 53 y.o. male who is s/p C4-T1 ACDF with my retired partner, Dr. Lucilla.  Reporting neck pain near the lower cervical spine. No radicular pain. Date of surgery: 08/02/2021 (~2 years post-op)   Plan: -Patient with continued neck pain so recommended a CT scan of the cervical spine to evaluate for pseudarthrosis as a potential cause of his pain -He is not having any radicular pain so would not recommend MRI at this time -Can continue with pain management -Follow-up in 1 month, x-rays at next visit: none   _________________________________________________________________________   Subjective: Patient with continued lower cervical neck pain.  He has no pain radiating into either upper extremity.  He has tried physical therapy and aquatic therapy.  He is also done with pain management.  He has not noticed any improvement since he was last seen in the office.  He notes the pain is worse with activity or any type of lifting.  It improves but does not go away with rest.    General: No acute distress, appropriate affect Neurologic: alert, answering questions appropriately, following commands Respiratory: unlabored breathing on room air Skin: Incision is well healed   MSK (spine):   -Strength exam                                                   Right                Left Grip strength                5/5                  5/5 Interosseus                  5/5                  5/5 Wrist extension            5/5                  5/5 Wrist flexion                 5/5                  5/5 Elbow flexion                5/5                  5/5 Deltoid                          5/5                  5/5    -Sensory exam                           Sensation intact to light touch in C5-T1 nerve distributions of bilateral upper extremities      Imaging: XRs of the cervical spine from 09/24/2023 were independently reviewed and interpreted, showing  C4-T1 anterior instrumentation.  No lucency seen around the instrumentation.  There is allograft interbody devices across the former C4-T1 disc spaces.  There appears to be fusion mass in those former  disc spaces.  No fracture or dislocation seen.  No evidence of instability on flexion/extension views.     Patient name: Henry Jennings Patient MRN: [id number] Date: 09/24/23

## 2023-10-01 DIAGNOSIS — F331 Major depressive disorder, recurrent, moderate: Secondary | ICD-10-CM | POA: Diagnosis not present

## 2023-10-01 DIAGNOSIS — F411 Generalized anxiety disorder: Secondary | ICD-10-CM | POA: Diagnosis not present

## 2023-10-08 DIAGNOSIS — F411 Generalized anxiety disorder: Secondary | ICD-10-CM | POA: Diagnosis not present

## 2023-10-08 DIAGNOSIS — F902 Attention-deficit hyperactivity disorder, combined type: Secondary | ICD-10-CM | POA: Diagnosis not present

## 2023-10-08 DIAGNOSIS — F331 Major depressive disorder, recurrent, moderate: Secondary | ICD-10-CM | POA: Diagnosis not present

## 2023-10-17 ENCOUNTER — Encounter: Payer: Self-pay | Admitting: Orthopedic Surgery

## 2023-10-17 DIAGNOSIS — F411 Generalized anxiety disorder: Secondary | ICD-10-CM | POA: Diagnosis not present

## 2023-10-17 DIAGNOSIS — F902 Attention-deficit hyperactivity disorder, combined type: Secondary | ICD-10-CM | POA: Diagnosis not present

## 2023-10-17 DIAGNOSIS — F331 Major depressive disorder, recurrent, moderate: Secondary | ICD-10-CM | POA: Diagnosis not present

## 2023-10-19 ENCOUNTER — Ambulatory Visit
Admission: RE | Admit: 2023-10-19 | Discharge: 2023-10-19 | Disposition: A | Discharge status: Home / Self Care | Source: Ambulatory Visit | Attending: Orthopedic Surgery | Admitting: Orthopedic Surgery

## 2023-10-19 DIAGNOSIS — Z981 Arthrodesis status: Secondary | ICD-10-CM

## 2023-10-19 DIAGNOSIS — M50321 Other cervical disc degeneration at C4-C5 level: Secondary | ICD-10-CM | POA: Diagnosis not present

## 2023-10-19 DIAGNOSIS — M47812 Spondylosis without myelopathy or radiculopathy, cervical region: Secondary | ICD-10-CM | POA: Diagnosis not present

## 2023-10-22 DIAGNOSIS — F902 Attention-deficit hyperactivity disorder, combined type: Secondary | ICD-10-CM | POA: Diagnosis not present

## 2023-10-22 DIAGNOSIS — F411 Generalized anxiety disorder: Secondary | ICD-10-CM | POA: Diagnosis not present

## 2023-10-22 DIAGNOSIS — F331 Major depressive disorder, recurrent, moderate: Secondary | ICD-10-CM | POA: Diagnosis not present

## 2023-10-25 ENCOUNTER — Ambulatory Visit: Admitting: Orthopedic Surgery

## 2023-10-25 DIAGNOSIS — M542 Cervicalgia: Secondary | ICD-10-CM

## 2023-10-25 NOTE — Progress Notes (Signed)
 Orthopedic Surgery Progress Note   Assessment: Patient is a 53 y.o. male who is s/p C4-T1 ACDF with my retired partner, Dr. Lucilla.  Still having neck pain on a daily basis.  No radicular pain. Date of surgery: 08/02/2021 (~2 years post-op)   Plan: -CT did not show any evidence of a pseudoarthrosis, so recommended continued conservative treatment -He does have facet arthropathy at C2/3 that may explain his upper cervical pain -Can continue with pain management -Follow-up in 6 months, x-rays at next visit: AP/lateral/flex/ex cervical   _________________________________________________________________________   Subjective: Patient continues to have neck pain on daily basis.  He feels it in the superior and inferior aspects of the neck.  He does not feel it is much in the middle the neck.  He notes it is worse with activity.  He does not have any radiating arm pain.     General: No acute distress, appropriate affect Neurologic: alert, answering questions appropriately, following commands Respiratory: unlabored breathing on room air Skin: Incision is well healed   MSK (spine):   -Strength exam                                                   Right                Left Grip strength                5/5                  5/5 Interosseus                  5/5                  5/5 Wrist extension            5/5                  5/5 Wrist flexion                 5/5                  5/5 Elbow flexion                5/5                  5/5 Deltoid                          5/5                  5/5     -Sensory exam                           Sensation intact to light touch in C5-T1 nerve distributions of bilateral upper extremities      Imaging: XRs of the cervical spine from 09/24/2023 were previously independently reviewed and interpreted, showing C4-T1 anterior instrumentation.  No lucency seen around the instrumentation.  There is allograft interbody devices across the former C4-T1 disc  spaces.  There appears to be fusion mass in those former disc spaces.  No fracture or dislocation seen.  No evidence of instability on flexion/extension views.  CT of the cervical spine from 10/19/2023 was independently reviewed and  interpreted, showing no lucency around any of the anterior instrumentation from C4-T1.  There are no screws backed out.  No broken instrumentation.  There appears to be fusion mass across the former disc spaces from C4-T1.  No significant adjacent level disease seen at C3/4.  There is facet arthropathy at C2/3.  No fracture or dislocation seen.     Patient name: Henry Jennings Patient MRN: [id number] Date: 10/25/23

## 2023-10-26 ENCOUNTER — Encounter: Payer: Self-pay | Admitting: Physical Medicine & Rehabilitation

## 2023-10-26 ENCOUNTER — Encounter: Attending: Physical Medicine & Rehabilitation | Admitting: Physical Medicine & Rehabilitation

## 2023-10-26 VITALS — BP 117/79 | HR 87 | Ht 65.0 in | Wt 185.0 lb

## 2023-10-26 DIAGNOSIS — M961 Postlaminectomy syndrome, not elsewhere classified: Secondary | ICD-10-CM | POA: Insufficient documentation

## 2023-10-26 NOTE — Progress Notes (Signed)
 Acupuncture treatment #10 Indication cervical post laminectomy syndrome Needs place bilaterally at the following points Sterile stainless steel Seirin acupuncture needles Bilateral GB 21 point, BL10, SJ17, GB 20 with e-stim at 2.5 Hz between these  points bilaterally Also GV 14 Treatment time 30 min, needles removed by CMA Pt tolerated treatment well

## 2023-10-29 DIAGNOSIS — F411 Generalized anxiety disorder: Secondary | ICD-10-CM | POA: Diagnosis not present

## 2023-10-29 DIAGNOSIS — F902 Attention-deficit hyperactivity disorder, combined type: Secondary | ICD-10-CM | POA: Diagnosis not present

## 2023-10-29 DIAGNOSIS — F331 Major depressive disorder, recurrent, moderate: Secondary | ICD-10-CM | POA: Diagnosis not present

## 2023-11-05 DIAGNOSIS — F902 Attention-deficit hyperactivity disorder, combined type: Secondary | ICD-10-CM | POA: Diagnosis not present

## 2023-11-05 DIAGNOSIS — F331 Major depressive disorder, recurrent, moderate: Secondary | ICD-10-CM | POA: Diagnosis not present

## 2023-11-05 DIAGNOSIS — F411 Generalized anxiety disorder: Secondary | ICD-10-CM | POA: Diagnosis not present

## 2023-11-12 DIAGNOSIS — F902 Attention-deficit hyperactivity disorder, combined type: Secondary | ICD-10-CM | POA: Diagnosis not present

## 2023-11-12 DIAGNOSIS — F331 Major depressive disorder, recurrent, moderate: Secondary | ICD-10-CM | POA: Diagnosis not present

## 2023-11-12 DIAGNOSIS — F411 Generalized anxiety disorder: Secondary | ICD-10-CM | POA: Diagnosis not present

## 2023-11-26 DIAGNOSIS — F411 Generalized anxiety disorder: Secondary | ICD-10-CM | POA: Diagnosis not present

## 2023-11-26 DIAGNOSIS — F331 Major depressive disorder, recurrent, moderate: Secondary | ICD-10-CM | POA: Diagnosis not present

## 2023-11-26 DIAGNOSIS — F902 Attention-deficit hyperactivity disorder, combined type: Secondary | ICD-10-CM | POA: Diagnosis not present

## 2023-11-27 ENCOUNTER — Encounter: Attending: Physical Medicine & Rehabilitation | Admitting: Physical Medicine & Rehabilitation

## 2023-11-27 DIAGNOSIS — M961 Postlaminectomy syndrome, not elsewhere classified: Secondary | ICD-10-CM | POA: Insufficient documentation

## 2023-12-03 DIAGNOSIS — F411 Generalized anxiety disorder: Secondary | ICD-10-CM | POA: Diagnosis not present

## 2023-12-03 DIAGNOSIS — F331 Major depressive disorder, recurrent, moderate: Secondary | ICD-10-CM | POA: Diagnosis not present

## 2023-12-03 DIAGNOSIS — F902 Attention-deficit hyperactivity disorder, combined type: Secondary | ICD-10-CM | POA: Diagnosis not present

## 2023-12-10 DIAGNOSIS — F902 Attention-deficit hyperactivity disorder, combined type: Secondary | ICD-10-CM | POA: Diagnosis not present

## 2023-12-10 DIAGNOSIS — F331 Major depressive disorder, recurrent, moderate: Secondary | ICD-10-CM | POA: Diagnosis not present

## 2023-12-10 DIAGNOSIS — F411 Generalized anxiety disorder: Secondary | ICD-10-CM | POA: Diagnosis not present

## 2024-01-01 ENCOUNTER — Encounter: Attending: Physical Medicine & Rehabilitation | Admitting: Physical Medicine & Rehabilitation

## 2024-01-01 DIAGNOSIS — M961 Postlaminectomy syndrome, not elsewhere classified: Secondary | ICD-10-CM | POA: Insufficient documentation

## 2024-01-09 ENCOUNTER — Other Ambulatory Visit: Payer: Self-pay

## 2024-01-09 MED ORDER — GABAPENTIN 400 MG PO CAPS: 400.0000 mg | ORAL_CAPSULE | Freq: Two times a day (BID) | ORAL | 2 refills | Status: AC

## 2024-02-15 IMAGING — MR MR CERVICAL SPINE W/O CM
5 series · 37 of 48 positions shown · non-contrast
Comparison: 08/01/2016

CLINICAL DATA: Chronic neck pain. Arm weakness and shoulder
weakness.

EXAM:
MRI CERVICAL SPINE WITHOUT CONTRAST
TECHNIQUE: Multiplanar, multisequence MR imaging of the cervical spine was
performed. No intravenous contrast was administered.

[Series 2: T2 · sagittal · 3.0mm · 0.43mm/px · 8 of 16 slices shown (1 of 2)]
[im 1/16]
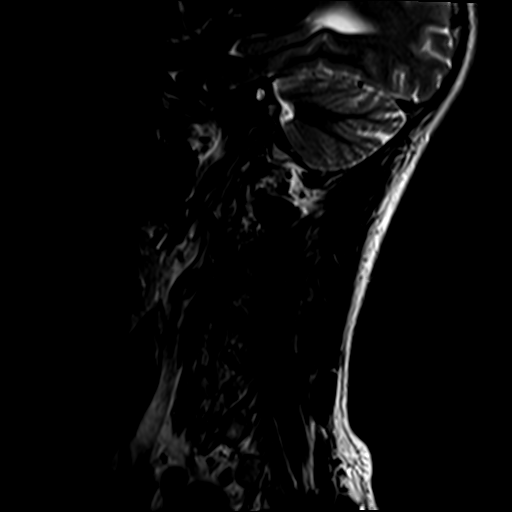
[im 3/16]
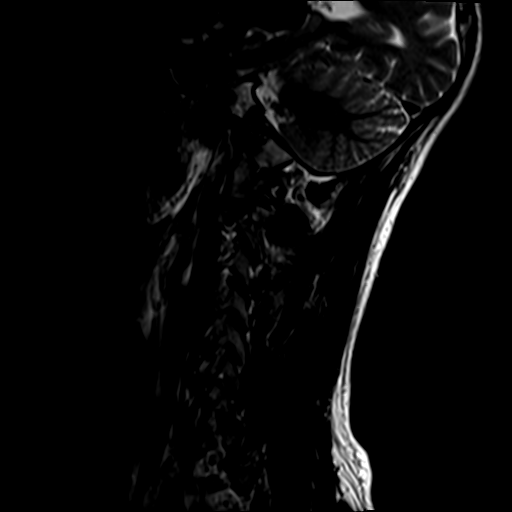
[im 5/16]
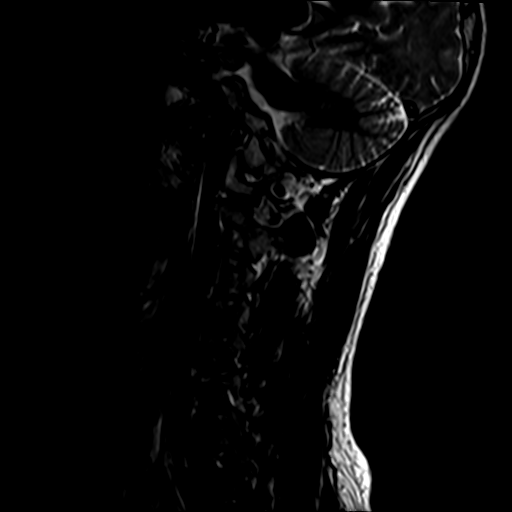
[im 7/16]
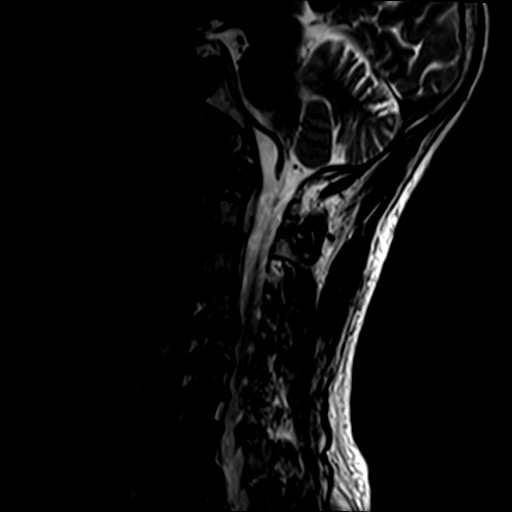
[im 9/16]
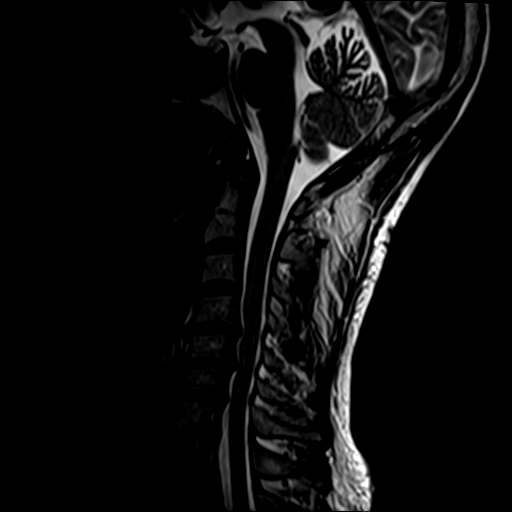
[im 11/16]
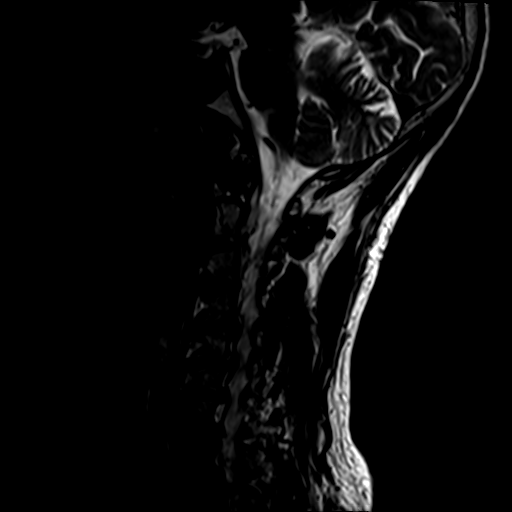
[im 13/16]
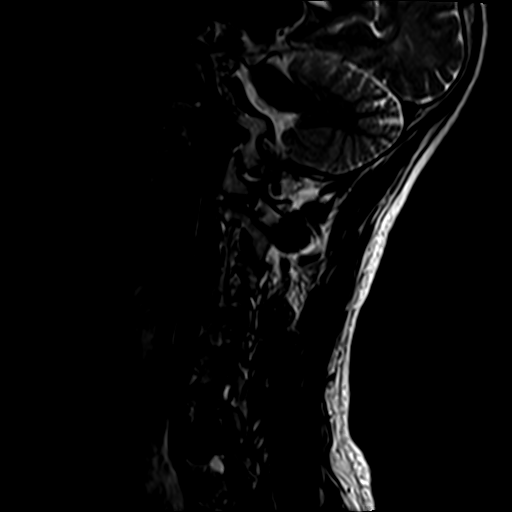
[im 16/16]
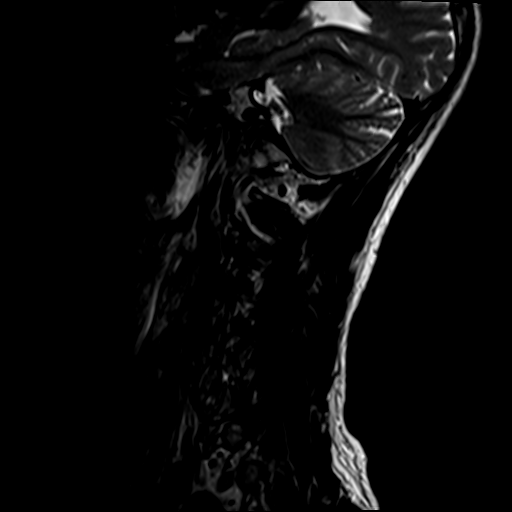

[Series 3: STIR · sagittal · 3.0mm · 0.86mm/px · 8 of 16 slices shown]
[im 1/16]
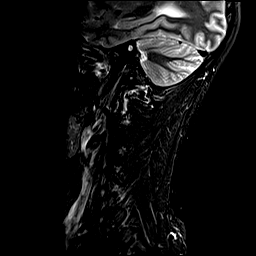
[im 3/16]
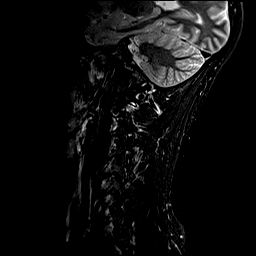
[im 5/16]
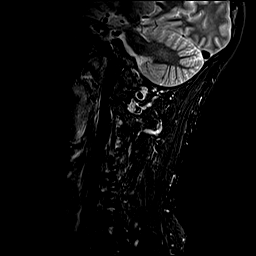
[im 7/16]
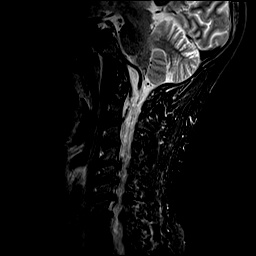
[im 9/16]
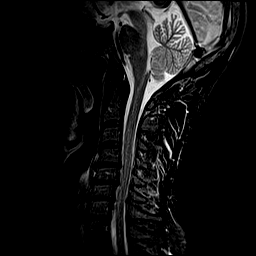
[im 11/16]
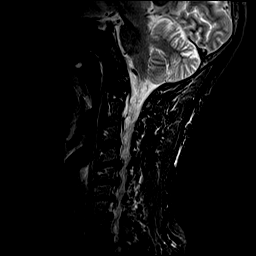
[im 13/16]
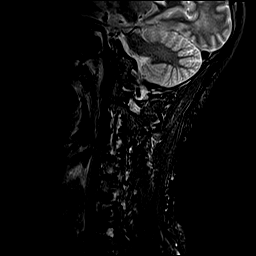
[im 16/16]
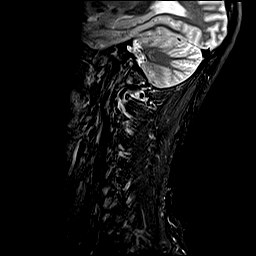

[Series 4: T1 · sagittal · 3.0mm · 0.86mm/px · 8 of 16 slices shown]
[im 1/16]
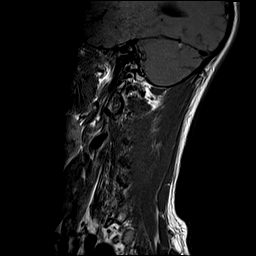
[im 3/16]
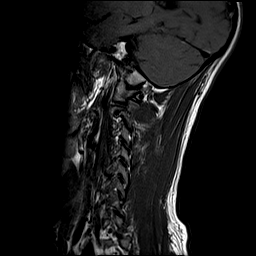
[im 5/16]
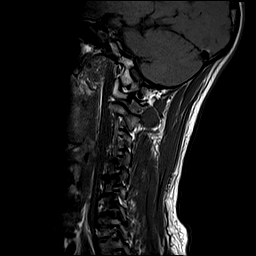
[im 7/16]
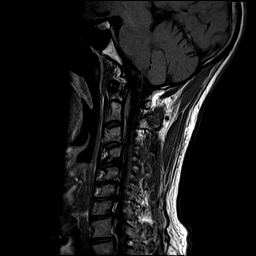
[im 9/16]
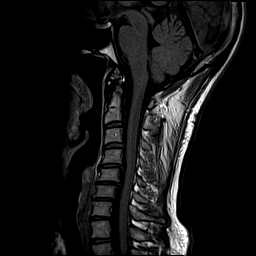
[im 11/16]
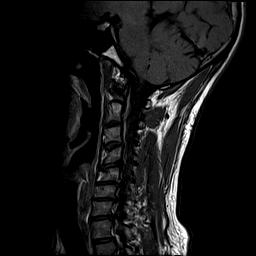
[im 13/16]
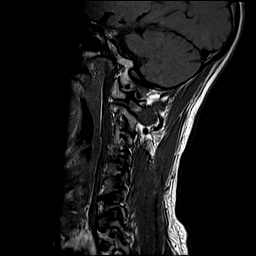
[im 16/16]
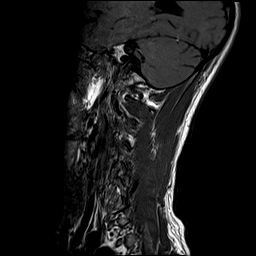

[Series 5: T2 · axial · 3.0mm · 0.70mm/px · z∈[-72,+18]mm · 11 of 23 slices shown (2 of 2)]
[im 1/23]
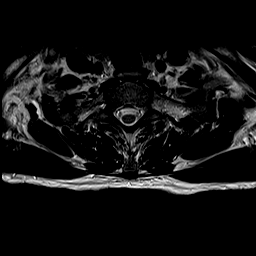
[im 3/23]
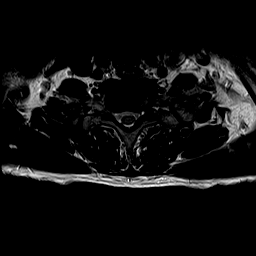
[im 5/23]
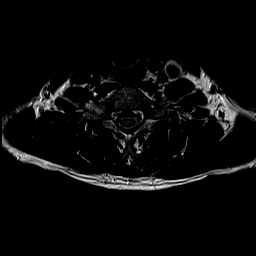
[im 7/23]
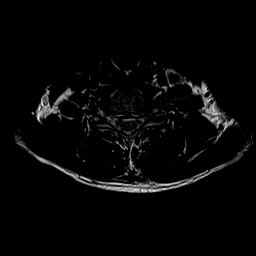
[im 9/23]
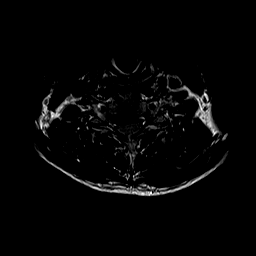
[im 12/23]
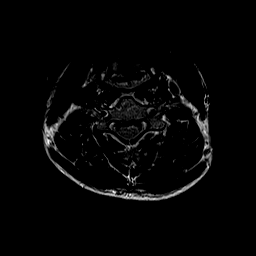
[im 14/23]
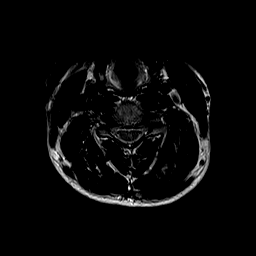
[im 16/23]
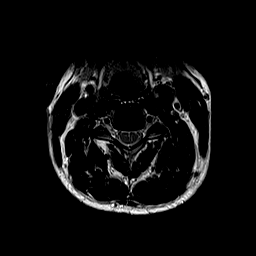
[im 18/23]
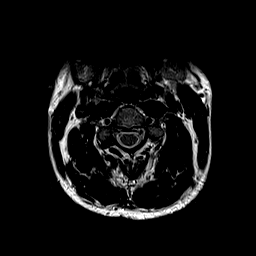
[im 20/23]
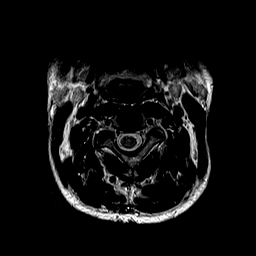
[im 23/23]
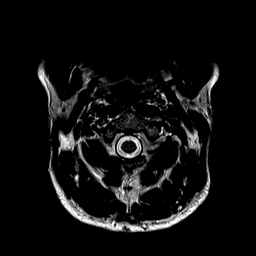

[Series 6: GRE · axial · 3.0mm · 0.35mm/px · z∈[-72,-58]mm · 2 of 26 slices shown]
[im 1/26]
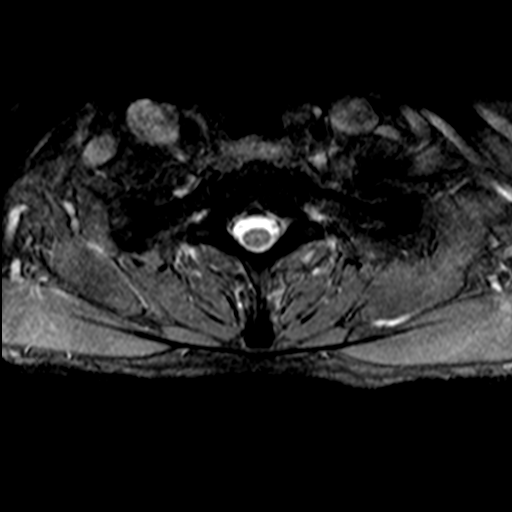
[im 5/26]
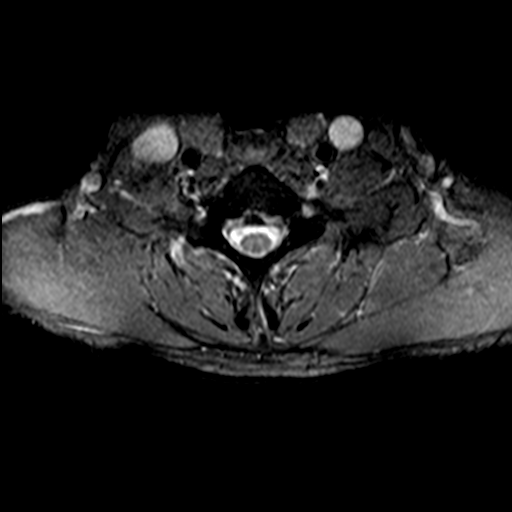

[37 of 48 positions shown; findings below may reference images not displayed]

FINDINGS: Alignment: 1-2 mm retrolisthesis of C4-C5, C5-C6 and C6 on C7.

Vertebrae: No acute fracture, evidence of discitis, or aggressive
bone lesion.

Cord: Normal signal and morphology.

Posterior Fossa, vertebral arteries, paraspinal tissues: Posterior
fossa demonstrates no focal abnormality. Vertebral artery flow voids
are maintained. Paraspinal soft tissues are unremarkable.

Disc levels:

Discs: Degenerative disease with disc height loss at C4-5, C5-6 and
C6-7.

C2-3: No significant disc bulge. No neural foraminal stenosis. No
central canal stenosis. Mild right facet arthropathy.

C3-4: No significant disc bulge. No neural foraminal stenosis. No
central canal stenosis.

C4-5: Broad-based disc bulge flattening the ventral thecal sac.
Bilateral uncovertebral degenerative changes. Moderate-severe
bilateral foraminal stenosis. Mild spinal stenosis.

C5-6: Broad-based disc osteophyte complex. Bilateral uncovertebral
degenerative changes. Severe right foraminal stenosis.
Moderate-severe left foraminal stenosis. Mild spinal stenosis.

C6-7: Broad-based disc osteophyte complex. Bilateral uncovertebral
degenerative changes. Moderate-severe bilateral foraminal stenosis.
No spinal stenosis.

C7-T1: Broad-based disc osteophyte complex. Bilateral uncovertebral
degenerative changes. Severe bilateral foraminal stenosis. No spinal
stenosis.
IMPRESSION: 1. At C4-5 there is a broad-based disc bulge flattening the ventral
thecal sac. Bilateral uncovertebral degenerative changes.
Moderate-severe bilateral foraminal stenosis.
2. At C5-6 there is a broad-based disc osteophyte complex. Bilateral
uncovertebral degenerative changes. Severe right foraminal stenosis.
Moderate-severe left foraminal stenosis. Mild spinal stenosis.
3. At C6-7 there is a broad-based disc osteophyte complex. Bilateral
uncovertebral degenerative changes. Moderate-severe bilateral
foraminal stenosis.
4. At C7-T1 there is a broad-based disc osteophyte complex. Severe
bilateral foraminal stenosis.
5. Mild interval progression of cervical spondylosis compared with
08/01/2016.

## 2024-04-24 ENCOUNTER — Ambulatory Visit: Admitting: Orthopedic Surgery
